# Patient Record
Sex: Male | Born: 1965
Health system: Southern US, Community
[De-identification: ages and names within clinical notes are randomized; demographics above are authoritative.]

## PROBLEM LIST (undated history)

## (undated) DIAGNOSIS — I499 Cardiac arrhythmia, unspecified: Secondary | ICD-10-CM

## (undated) DIAGNOSIS — R011 Cardiac murmur, unspecified: Secondary | ICD-10-CM

## (undated) DIAGNOSIS — Z87442 Personal history of urinary calculi: Secondary | ICD-10-CM

## (undated) DIAGNOSIS — E7800 Pure hypercholesterolemia, unspecified: Secondary | ICD-10-CM

## (undated) DIAGNOSIS — M7551 Bursitis of right shoulder: Secondary | ICD-10-CM

## (undated) DIAGNOSIS — Z95810 Presence of automatic (implantable) cardiac defibrillator: Secondary | ICD-10-CM

## (undated) DIAGNOSIS — I420 Dilated cardiomyopathy: Secondary | ICD-10-CM

## (undated) DIAGNOSIS — I509 Heart failure, unspecified: Secondary | ICD-10-CM

## (undated) DIAGNOSIS — I428 Other cardiomyopathies: Secondary | ICD-10-CM

## (undated) DIAGNOSIS — I2510 Atherosclerotic heart disease of native coronary artery without angina pectoris: Secondary | ICD-10-CM

## (undated) DIAGNOSIS — Z4502 Encounter for adjustment and management of automatic implantable cardiac defibrillator: Secondary | ICD-10-CM

## (undated) DIAGNOSIS — I5042 Chronic combined systolic (congestive) and diastolic (congestive) heart failure: Secondary | ICD-10-CM

---
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
## 1993-01-06 DIAGNOSIS — I499 Cardiac arrhythmia, unspecified: Secondary | ICD-10-CM

## 1993-01-06 HISTORY — DX: Cardiac arrhythmia, unspecified: I49.9

## 2000-08-03 ENCOUNTER — Emergency Department (HOSPITAL_COMMUNITY): Admission: EM | Admit: 2000-08-03 | Discharge: 2000-08-03 | Payer: Self-pay | Admitting: Emergency Medicine

## 2000-08-03 ENCOUNTER — Encounter: Payer: Self-pay | Admitting: Emergency Medicine

## 2004-01-03 ENCOUNTER — Emergency Department (HOSPITAL_COMMUNITY): Admission: EM | Admit: 2004-01-03 | Discharge: 2004-01-03 | Payer: Self-pay | Admitting: Emergency Medicine

## 2013-02-10 ENCOUNTER — Encounter (INDEPENDENT_AMBULATORY_CARE_PROVIDER_SITE_OTHER): Payer: Self-pay | Admitting: Surgery

## 2013-02-10 ENCOUNTER — Telehealth (INDEPENDENT_AMBULATORY_CARE_PROVIDER_SITE_OTHER): Payer: Self-pay

## 2013-02-10 ENCOUNTER — Ambulatory Visit (INDEPENDENT_AMBULATORY_CARE_PROVIDER_SITE_OTHER): Payer: BC Managed Care – PPO | Admitting: Surgery

## 2013-02-10 VITALS — BP 122/84 | HR 64 | Resp 12 | Ht 71.0 in | Wt 174.0 lb

## 2013-02-10 DIAGNOSIS — K4090 Unilateral inguinal hernia, without obstruction or gangrene, not specified as recurrent: Secondary | ICD-10-CM

## 2013-02-10 MED ORDER — HYDROCODONE-ACETAMINOPHEN 7.5-325 MG PO TABS: 1.0000 | ORAL_TABLET | Freq: Four times a day (QID) | ORAL | Status: DC | PRN

## 2013-02-10 NOTE — Progress Notes (Signed)
Dr. Hassell Done could you put orders in [facility] as patient has pre-op appointment on 02/15/2013 at 1 pm! Thank you!

## 2013-02-10 NOTE — Telephone Encounter (Signed)
Message copied by Carlene Coria on Thu Feb 10, 2013 12:36 PM ------      Message from: [place], [employer]: Thu Feb 10, 2013 10:14 AM      Regarding: Can you plese make a follow up appointment       I have schedule him for surgery on 2/11 but could not get a follow up appointment for after the surgery.            Can you please make him an appointment and call him?            thanks ------

## 2013-02-10 NOTE — Telephone Encounter (Signed)
Called pt with po appt.

## 2013-02-10 NOTE — Progress Notes (Signed)
Chief Complaint:  New right inguinal hernia  History of Present Illness:  Miguel Pena is an 48 y.o. male who is referred by Dr. Claris Gower with a right inguinal hernia.  He works for [employer] and does a lot of lifting and pulling. This inguinal hernia has been symptomatic intermittently painful. He denies any symptoms suggesting obstruction.  No past medical history on file.  No past surgical history on file.  Current Outpatient Prescriptions  Medication Sig Dispense Refill  . Fish Oil OIL by Does not apply route.      Marland Kitchen HYDROcodone-acetaminophen (NORCO) 7.5-325 MG per tablet Take 1 tablet by mouth every 6 (six) hours as needed for moderate pain.  30 tablet  0  . nicotine polacrilex (NICORETTE) 2 MG gum Take 2 mg by mouth as needed for smoking cessation.      Marland Kitchen zolpidem (AMBIEN) 10 MG tablet Take 10 mg by mouth at bedtime as needed for sleep.       No current facility-administered medications for this visit.   Review of patient's allergies indicates no known allergies. No family history on file. Social History:   reports that he has quit smoking. He does not have any smokeless tobacco history on file. He reports that he does not drink alcohol or use illicit drugs.   REVIEW OF SYSTEMS - PERTINENT POSITIVES ONLY: Is negative except for symptoms referable to his hernia.  Physical Exam:   Blood pressure 122/84, pulse 64, resp. rate 12, height 5' 11"  (1.803 m), weight 174 lb (78.926 kg). Body mass index is 24.28 kg/(m^2).  Gen:  WDWN white male NAD  Neurological: Alert and oriented to person, place, and time. Motor and sensory function is grossly intact  Head: Normocephalic and atraumatic.  Eyes: Conjunctivae are normal. Pupils are equal, round, and reactive to light. No scleral icterus.  Neck: Normal range of motion. Neck supple. No tracheal deviation or thyromegaly present.  Cardiovascular:  SR without murmurs or gallops.  No carotid bruits Respiratory: Effort normal.  No  respiratory distress. No chest wall tenderness. Breath sounds normal.  No wheezes, rales or rhonchi.  Abdomen:  Soft bulging right inguinal canal easily reducible. There may be a faint bulge in the left inguinal canal but this is very subtle at best. GU: Musculoskeletal: Normal range of motion. Extremities are nontender. No cyanosis, edema or clubbing noted Lymphadenopathy: No cervical, preauricular, postauricular or axillary adenopathy is present Skin: Skin is warm and dry. No rash noted. No diaphoresis. No erythema. No pallor. Pscyh: Normal mood and affect. Behavior is normal. Judgment and thought content normal.   LABORATORY RESULTS: No results found for this or any previous visit (from the past 48 hour(s)).  RADIOLOGY RESULTS: No results found.  Problem List: There are no active problems to display for this patient.   Assessment & Plan: Symptomatic right inguinal hernia. I explained the procedure and my recommendation to do a right inguinal hernia in an open fashion. I discussed laparoscopic fashion. I discussed repair with mesh and indicated that he might have postoperative pain or feel different on the right side. He is aware that there is a chance of recurrence. I gave him a booklet hernia repair for him to review at his leisure.  We'll plan outpatient right inguinal hernia repair under general anesthesia.    [name], MD, [name] [phone] beeper [phone]  02/10/2013 9:34 AM

## 2013-02-10 NOTE — Patient Instructions (Signed)

## 2013-02-11 NOTE — Progress Notes (Signed)
Please put orders in Epic surgery 02-16-13 pre op visit 02-15-13 Thank you!

## 2013-02-14 ENCOUNTER — Encounter (HOSPITAL_COMMUNITY): Payer: Self-pay | Admitting: Pharmacy Technician

## 2013-02-14 NOTE — Progress Notes (Signed)
Please put orders in Epic,surgery 02-16-2013,pre op 02-15-2013 Thank you!

## 2013-02-15 ENCOUNTER — Encounter (HOSPITAL_COMMUNITY): Payer: Self-pay

## 2013-02-15 ENCOUNTER — Encounter (HOSPITAL_COMMUNITY)
Admission: RE | Admit: 2013-02-15 | Discharge: 2013-02-15 | Disposition: A | Discharge status: Home / Self Care | Payer: BC Managed Care – PPO | Source: Ambulatory Visit | Attending: Surgery | Admitting: Surgery

## 2013-02-15 HISTORY — DX: Pure hypercholesterolemia, unspecified: E78.00

## 2013-02-15 HISTORY — DX: Personal history of urinary calculi: Z87.442

## 2013-02-15 HISTORY — DX: Cardiac arrhythmia, unspecified: I49.9

## 2013-02-15 HISTORY — DX: Bursitis of right shoulder: M75.51

## 2013-02-15 LAB — BASIC METABOLIC PANEL
BUN: 7 mg/dL (ref 6–23)
CO2: 28 mEq/L (ref 19–32)
Calcium: 9.5 mg/dL (ref 8.4–10.5)
Chloride: 103 mEq/L (ref 96–112)
Creatinine, Ser: 0.75 mg/dL (ref 0.50–1.35)
Glucose, Bld: 84 mg/dL (ref 70–99)
POTASSIUM: 4 meq/L (ref 3.7–5.3)
Sodium: 141 mEq/L (ref 137–147)

## 2013-02-15 LAB — CBC
HCT: 42.6 % (ref 39.0–52.0)
Hemoglobin: 14.9 g/dL (ref 13.0–17.0)
MCH: 30.4 pg (ref 26.0–34.0)
MCHC: 35 g/dL (ref 30.0–36.0)
MCV: 86.9 fL (ref 78.0–100.0)
PLATELETS: 200 10*3/uL (ref 150–400)
RBC: 4.9 MIL/uL (ref 4.22–5.81)
RDW: 12.7 % (ref 11.5–15.5)
WBC: 6.1 10*3/uL (ref 4.0–10.5)

## 2013-02-15 NOTE — Progress Notes (Signed)
Spoke with Dr Christian Mate, anesthesia, history and ekg reviewed- stated would be evaluated by anesthesia doctor tomorrow.  Faxed EKG TO Dr Arelia Sneddon, PCP with confirmation

## 2013-02-15 NOTE — Patient Instructions (Signed)
      Your procedure is scheduled on:  02/17/12  [facility]  Report to [place] AM.  Call this number if you have problems the morning of surgery: [phone]      Do not eat food  Or drink :After Midnight.  Tuesday  Night   Take these medicines the morning of surgery with A SIP OF WATER:NO regular meds-       May take HYDROCODONE IF NEEDED   .  Contacts, dentures or partial plates, or metal hairpins  can not be worn to surgery. Your family will be responsible for glasses, dentures, hearing aides while you are in surgery  Leave suitcase in the car. After surgery it may be brought to your room.  For patients admitted to the hospital, checkout time is 11:00 AM day of  discharge.                DO NOT WEAR JEWELRY, LOTIONS, POWDERS, OR PERFUMES.  WOMEN-- DO NOT SHAVE LEGS OR UNDERARMS FOR 48 HOURS BEFORE SHOWERS. MEN MAY SHAVE FACE.  Patients discharged the day of surgery will not be allowed to drive home. IF going home the day of surgery, you must have a driver and someone to stay with you for the first 24 hours  Name and phone number of your driver:    Wife  Margart Sickles  PST [phone]  [phone]                 FAILURE TO FOLLOW THESE INSTRUCTIONS MAY RESULT IN  CANCELLATION   OF YOUR SURGERY                                                  Patient Signature _____________________________

## 2013-02-15 NOTE — Progress Notes (Signed)
Attempted to obtain old EKG from Dr Arelia Sneddon office- was told began care there 12/13 and there is not a EKG at there office

## 2013-02-16 ENCOUNTER — Ambulatory Visit (HOSPITAL_COMMUNITY)
Admission: RE | Admit: 2013-02-16 | Discharge: 2013-02-16 | Disposition: A | Discharge status: Home / Self Care | Payer: BC Managed Care – PPO | Source: Ambulatory Visit | Attending: Surgery | Admitting: Surgery

## 2013-02-16 ENCOUNTER — Encounter (HOSPITAL_COMMUNITY): Payer: Self-pay | Admitting: *Deleted

## 2013-02-16 ENCOUNTER — Encounter (HOSPITAL_COMMUNITY)
Admission: RE | Disposition: A | Discharge status: Home / Self Care | Payer: Self-pay | Source: Ambulatory Visit | Attending: Surgery

## 2013-02-16 ENCOUNTER — Ambulatory Visit (HOSPITAL_COMMUNITY): Payer: BC Managed Care – PPO | Admitting: Certified Registered"

## 2013-02-16 ENCOUNTER — Encounter (HOSPITAL_COMMUNITY): Payer: BC Managed Care – PPO | Admitting: Certified Registered"

## 2013-02-16 ENCOUNTER — Other Ambulatory Visit (INDEPENDENT_AMBULATORY_CARE_PROVIDER_SITE_OTHER): Payer: Self-pay | Admitting: Surgery

## 2013-02-16 DIAGNOSIS — Z87891 Personal history of nicotine dependence: Secondary | ICD-10-CM | POA: Insufficient documentation

## 2013-02-16 DIAGNOSIS — K4090 Unilateral inguinal hernia, without obstruction or gangrene, not specified as recurrent: Secondary | ICD-10-CM

## 2013-02-16 DIAGNOSIS — Z8719 Personal history of other diseases of the digestive system: Secondary | ICD-10-CM

## 2013-02-16 DIAGNOSIS — Z9889 Other specified postprocedural states: Secondary | ICD-10-CM

## 2013-02-16 HISTORY — PX: INGUINAL HERNIA REPAIR: SHX194

## 2013-02-16 HISTORY — PX: INGUINAL HERNIA REPAIR: SUR1180

## 2013-02-16 SURGERY — REPAIR, HERNIA, INGUINAL, ADULT
Anesthesia: General | Site: Groin | Laterality: Right

## 2013-02-16 MED ORDER — PROPOFOL 10 MG/ML IV BOLUS
INTRAVENOUS | Status: DC | PRN
  Administered 2013-02-16: 200 mg via INTRAVENOUS

## 2013-02-16 MED ORDER — HEPARIN SODIUM (PORCINE) 5000 UNIT/ML IJ SOLN
5000.0000 [IU] | Freq: Once | INTRAMUSCULAR | Status: AC
  Administered 2013-02-16: 5000 [IU] via SUBCUTANEOUS
  Filled 2013-02-16: qty 1

## 2013-02-16 MED ORDER — CEFAZOLIN SODIUM-DEXTROSE 2-3 GM-% IV SOLR
2.0000 g | INTRAVENOUS | Status: AC
  Administered 2013-02-16: 2 g via INTRAVENOUS

## 2013-02-16 MED ORDER — PROPOFOL 10 MG/ML IV BOLUS
INTRAVENOUS | Status: AC
  Filled 2013-02-16: qty 20

## 2013-02-16 MED ORDER — MORPHINE SULFATE 10 MG/ML IJ SOLN: 1.0000 mg | INTRAMUSCULAR | Status: DC | PRN

## 2013-02-16 MED ORDER — FENTANYL CITRATE 0.05 MG/ML IJ SOLN
INTRAMUSCULAR | Status: AC
  Filled 2013-02-16: qty 2

## 2013-02-16 MED ORDER — ONDANSETRON HCL 4 MG/2ML IJ SOLN
INTRAMUSCULAR | Status: AC
  Filled 2013-02-16: qty 2

## 2013-02-16 MED ORDER — SODIUM CHLORIDE 0.9 % IJ SOLN: 3.0000 mL | Freq: Two times a day (BID) | INTRAMUSCULAR | Status: DC

## 2013-02-16 MED ORDER — BUPIVACAINE LIPOSOME 1.3 % IJ SUSP
20.0000 mL | Freq: Once | INTRAMUSCULAR | Status: DC
  Filled 2013-02-16 (×2): qty 20

## 2013-02-16 MED ORDER — KETOROLAC TROMETHAMINE 15 MG/ML IJ SOLN
INTRAMUSCULAR | Status: AC
  Filled 2013-02-16: qty 1

## 2013-02-16 MED ORDER — ONDANSETRON HCL 4 MG/2ML IJ SOLN: 4.0000 mg | Freq: Four times a day (QID) | INTRAMUSCULAR | Status: DC | PRN

## 2013-02-16 MED ORDER — CHLORHEXIDINE GLUCONATE 4 % EX LIQD: 1.0000 "application " | Freq: Once | CUTANEOUS | Status: DC

## 2013-02-16 MED ORDER — ONDANSETRON HCL 4 MG/2ML IJ SOLN
INTRAMUSCULAR | Status: DC | PRN
  Administered 2013-02-16: 4 mg via INTRAVENOUS

## 2013-02-16 MED ORDER — LACTATED RINGERS IV SOLN: INTRAVENOUS | Status: DC

## 2013-02-16 MED ORDER — OXYCODONE HCL 5 MG PO TABS: 5.0000 mg | ORAL_TABLET | ORAL | Status: DC | PRN

## 2013-02-16 MED ORDER — LIDOCAINE HCL (CARDIAC) 20 MG/ML IV SOLN
INTRAVENOUS | Status: DC | PRN
  Administered 2013-02-16: 80 mg via INTRAVENOUS

## 2013-02-16 MED ORDER — ACETAMINOPHEN 650 MG RE SUPP
650.0000 mg | RECTAL | Status: DC | PRN
  Filled 2013-02-16: qty 1

## 2013-02-16 MED ORDER — CEFAZOLIN SODIUM-DEXTROSE 2-3 GM-% IV SOLR
INTRAVENOUS | Status: AC
  Filled 2013-02-16: qty 50

## 2013-02-16 MED ORDER — ACETAMINOPHEN 325 MG PO TABS: 650.0000 mg | ORAL_TABLET | ORAL | Status: DC | PRN

## 2013-02-16 MED ORDER — KETOROLAC TROMETHAMINE 15 MG/ML IJ SOLN
15.0000 mg | Freq: Four times a day (QID) | INTRAMUSCULAR | Status: DC
Start: 2013-02-16 — End: 2013-02-16
  Administered 2013-02-16: 15 mg via INTRAVENOUS

## 2013-02-16 MED ORDER — OXYCODONE-ACETAMINOPHEN 5-325 MG PO TABS: 1.0000 | ORAL_TABLET | ORAL | Status: DC | PRN

## 2013-02-16 MED ORDER — 0.9 % SODIUM CHLORIDE (POUR BTL) OPTIME
TOPICAL | Status: DC | PRN
  Administered 2013-02-16: 1000 mL

## 2013-02-16 MED ORDER — MIDAZOLAM HCL 2 MG/2ML IJ SOLN
INTRAMUSCULAR | Status: AC
  Filled 2013-02-16: qty 2

## 2013-02-16 MED ORDER — FENTANYL CITRATE 0.05 MG/ML IJ SOLN
INTRAMUSCULAR | Status: DC | PRN
Start: 2013-02-16 — End: 2013-02-16
  Administered 2013-02-16 (×4): 50 ug via INTRAVENOUS

## 2013-02-16 MED ORDER — LACTATED RINGERS IV SOLN
INTRAVENOUS | Status: DC | PRN
  Administered 2013-02-16 (×2): via INTRAVENOUS

## 2013-02-16 MED ORDER — FENTANYL CITRATE 0.05 MG/ML IJ SOLN
25.0000 ug | INTRAMUSCULAR | Status: DC | PRN
  Administered 2013-02-16 (×3): 50 ug via INTRAVENOUS

## 2013-02-16 MED ORDER — BUPIVACAINE LIPOSOME 1.3 % IJ SUSP
INTRAMUSCULAR | Status: DC | PRN
  Administered 2013-02-16: 20 mL

## 2013-02-16 MED ORDER — MIDAZOLAM HCL 5 MG/5ML IJ SOLN
INTRAMUSCULAR | Status: DC | PRN
  Administered 2013-02-16: 2 mg via INTRAVENOUS

## 2013-02-16 MED ORDER — LIDOCAINE HCL (CARDIAC) 20 MG/ML IV SOLN
INTRAVENOUS | Status: AC
  Filled 2013-02-16: qty 5

## 2013-02-16 MED ORDER — SODIUM CHLORIDE 0.9 % IV SOLN: 250.0000 mL | INTRAVENOUS | Status: DC | PRN

## 2013-02-16 MED ORDER — SODIUM CHLORIDE 0.9 % IJ SOLN: 3.0000 mL | INTRAMUSCULAR | Status: DC | PRN

## 2013-02-16 SURGICAL SUPPLY — 41 items
ADH SKN CLS APL DERMABOND .7 (GAUZE/BANDAGES/DRESSINGS) ×1
APL SKNCLS STERI-STRIP NONHPOA (GAUZE/BANDAGES/DRESSINGS)
BENZOIN TINCTURE PRP APPL 2/3 (GAUZE/BANDAGES/DRESSINGS) IMPLANT
BLADE HEX COATED 2.75 (ELECTRODE) ×3 IMPLANT
BLADE SURG 15 STRL LF DISP TIS (BLADE) ×1 IMPLANT
BLADE SURG 15 STRL SS (BLADE) ×3
BLADE SURG SZ10 CARB STEEL (BLADE) ×3 IMPLANT
CANISTER SUCTION 2500CC (MISCELLANEOUS) ×3 IMPLANT
CLOSURE WOUND 1/2 X4 (GAUZE/BANDAGES/DRESSINGS)
DECANTER SPIKE VIAL GLASS SM (MISCELLANEOUS) ×3 IMPLANT
DERMABOND ADVANCED (GAUZE/BANDAGES/DRESSINGS) ×2
DERMABOND ADVANCED .7 DNX12 (GAUZE/BANDAGES/DRESSINGS) IMPLANT
DISSECTOR ROUND CHERRY 3/8 STR (MISCELLANEOUS) IMPLANT
DRAIN PENROSE 18X1/2 LTX STRL (DRAIN) ×3 IMPLANT
DRAPE LAPAROTOMY TRNSV 102X78 (DRAPE) ×3 IMPLANT
ELECT REM PT RETURN 9FT ADLT (ELECTROSURGICAL) ×3
ELECTRODE REM PT RTRN 9FT ADLT (ELECTROSURGICAL) ×1 IMPLANT
GLOVE BIOGEL M 8.0 STRL (GLOVE) ×3 IMPLANT
GOWN STRL REUS W/TWL XL LVL3 (GOWN DISPOSABLE) ×6 IMPLANT
KIT BASIN OR (CUSTOM PROCEDURE TRAY) ×3 IMPLANT
MESH HERNIA 3X6 (Mesh General) ×2 IMPLANT
NDL HYPO 25X1 1.5 SAFETY (NEEDLE) ×1 IMPLANT
NEEDLE HYPO 25X1 1.5 SAFETY (NEEDLE) ×3 IMPLANT
NS IRRIG 1000ML POUR BTL (IV SOLUTION) ×3 IMPLANT
PACK BASIC VI WITH GOWN DISP (CUSTOM PROCEDURE TRAY) ×3 IMPLANT
PENCIL BUTTON HOLSTER BLD 10FT (ELECTRODE) ×3 IMPLANT
SPONGE GAUZE 4X4 12PLY (GAUZE/BANDAGES/DRESSINGS) IMPLANT
SPONGE LAP 4X18 X RAY DECT (DISPOSABLE) ×3 IMPLANT
STAPLER VISISTAT 35W (STAPLE) IMPLANT
STRIP CLOSURE SKIN 1/2X4 (GAUZE/BANDAGES/DRESSINGS) IMPLANT
SUT PROLENE 2 0 CT2 30 (SUTURE) ×6 IMPLANT
SUT SILK 2 0 SH (SUTURE) ×4 IMPLANT
SUT SILK 2 0 SH CR/8 (SUTURE) IMPLANT
SUT VIC AB 2-0 SH 27 (SUTURE) ×3
SUT VIC AB 2-0 SH 27X BRD (SUTURE) ×1 IMPLANT
SUT VIC AB 4-0 SH 18 (SUTURE) ×7 IMPLANT
SYR 20CC LL (SYRINGE) ×3 IMPLANT
SYR BULB IRRIGATION 50ML (SYRINGE) ×3 IMPLANT
TOWEL OR 17X26 10 PK STRL BLUE (TOWEL DISPOSABLE) ×3 IMPLANT
TOWEL OR NON WOVEN STRL DISP B (DISPOSABLE) ×3 IMPLANT
YANKAUER SUCT BULB TIP 10FT TU (MISCELLANEOUS) ×3 IMPLANT

## 2013-02-16 NOTE — Interval H&P Note (Signed)
History and Physical Interval Note:  02/16/2013 8:34 AM  Miguel Pena  has presented today for surgery, with the diagnosis of right inguinal hernia  The various methods of treatment have been discussed with the patient and family. After consideration of risks, benefits and other options for treatment, the patient has consented to  Procedure(s): RIGHT INGUINAL HERNIA REPAIR  (Right) as a surgical intervention .  The patient's history has been reviewed, patient examined, no change in status, stable for surgery.  I have reviewed the patient's chart and labs.  Questions were answered to the patient's satisfaction.     Noraa Pickeral B

## 2013-02-16 NOTE — Anesthesia Preprocedure Evaluation (Addendum)
Anesthesia Evaluation  Patient identified by MRN, date of birth, ID band Patient awake    Reviewed: Allergy & Precautions, H&P , NPO status , Patient's Chart, lab work & pertinent test results  Airway Mallampati: II TM Distance: >3 FB Neck ROM: full    Dental  (+) Dental Advisory Given, Chipped, Caps Large chip left upper front.  Cap right upper front:   Pulmonary neg pulmonary ROS, former smoker,  breath sounds clear to auscultation  Pulmonary exam normal       Cardiovascular Exercise Tolerance: Good + dysrhythmias Ventricular Tachycardia Rhythm:regular Rate:Normal  LBBB.  VT 20 years ago but not again.  No previous ECG to see if LBBB is recent.   Neuro/Psych negative neurological ROS  negative psych ROS   GI/Hepatic negative GI ROS, Neg liver ROS,   Endo/Other  negative endocrine ROS  Renal/GU negative Renal ROS  negative genitourinary   Musculoskeletal   Abdominal   Peds  Hematology negative hematology ROS (+)   Anesthesia Other Findings   Reproductive/Obstetrics negative OB ROS                          Anesthesia Physical Anesthesia Plan  ASA: III  Anesthesia Plan: General   Post-op Pain Management:    Induction: Intravenous  Airway Management Planned: LMA  Additional Equipment:   Intra-op Plan:   Post-operative Plan:   Informed Consent: I have reviewed the patients History and Physical, chart, labs and discussed the procedure including the risks, benefits and alternatives for the proposed anesthesia with the patient or authorized representative who has indicated his/her understanding and acceptance.   Dental Advisory Given  Plan Discussed with: CRNA and Surgeon  Anesthesia Plan Comments:         Anesthesia Quick Evaluation

## 2013-02-16 NOTE — Transfer of Care (Signed)
Immediate Anesthesia Transfer of Care Note  Patient: Miguel Pena  Procedure(s) Performed: Procedure(s): RIGHT INGUINAL HERNIA REPAIR  (Right)  Patient Location: PACU  Anesthesia Type:General  Level of Consciousness: awake, alert  and oriented  Airway & Oxygen Therapy: Patient Spontanous Breathing and Patient connected to face mask oxygen  Post-op Assessment: Report given to PACU RN and Post -op Vital signs reviewed and stable  Post vital signs: Reviewed and stable  Complications: No apparent anesthesia complications

## 2013-02-16 NOTE — Anesthesia Postprocedure Evaluation (Signed)
  Anesthesia Post-op Note  Patient: Miguel Pena  Procedure(s) Performed: Procedure(s) (LRB): RIGHT INGUINAL HERNIA REPAIR  (Right)  Patient Location: PACU  Anesthesia Type: General  Level of Consciousness: awake and alert   Airway and Oxygen Therapy: Patient Spontanous Breathing  Post-op Pain: mild  Post-op Assessment: Post-op Vital signs reviewed, Patient's Cardiovascular Status Stable, Respiratory Function Stable, Patent Airway and No signs of Nausea or vomiting  Last Vitals:  Filed Vitals:   02/16/13 1107  BP: 129/75  Pulse: 89  Temp: 36.7 C  Resp: 14    Post-op Vital Signs: stable   Complications: No apparent anesthesia complications

## 2013-02-16 NOTE — Discharge Instructions (Signed)
Inguinal Hernia, Adult  Care After Refer to this sheet in the next few weeks. These discharge instructions provide you with general information on caring for yourself after you leave the hospital. Your caregiver may also give you specific instructions. Your treatment has been planned according to the most current medical practices available, but unavoidable complications sometimes occur. If you have any problems or questions after discharge, please call your caregiver. HOME CARE INSTRUCTIONS  Put ice on the operative site.  Put ice in a plastic bag.  Place a towel between your skin and the bag.  Leave the ice on for 15-20 minutes at a time, 03-04 times a day while awake.  Change bandages (dressings) as directed.  Keep the wound dry and clean. The wound may be washed gently with soap and water. Gently blot or dab the wound dry. It is okay to take showers 24 to 48 hours after surgery. Do not take baths, use swimming pools, or use hot tubs for 10 days, or as directed by your caregiver.  Only take over-the-counter or prescription medicines for pain, discomfort, or fever as directed by your caregiver.  Continue your normal diet as directed.  Do not lift anything more than 10 pounds or play contact sports for 3 weeks, or as directed. SEEK MEDICAL CARE IF:  There is redness, swelling, or increasing pain in the wound.  There is fluid (pus) coming from the wound.  There is drainage from a wound lasting longer than 1 day.  You have an oral temperature above 102 F (38.9 C).  You notice a bad smell coming from the wound or dressing.  The wound breaks open after the stitches (sutures) have been removed.  You notice increasing pain in the shoulders (shoulder strap areas).  You develop dizzy episodes or fainting while standing.  You feel sick to your stomach (nauseous) or throw up (vomit). SEEK IMMEDIATE MEDICAL CARE IF:  You develop a rash.  You have difficulty breathing.  You  develop a reaction or have side effects to medicines you were given. MAKE SURE YOU:   Understand these instructions.  Will watch your condition.  Will get help right away if you are not doing well or get worse. Document Released: 01/23/2006 Document Revised: 03/17/2011 Document Reviewed: 11/22/2008 [facility] Patient Information 2014 [place], [place].

## 2013-02-16 NOTE — Op Note (Signed)
Surgeon: Kaylyn Lim, MD, FACS  Asst:  none  Anes:  General by LMA  Procedure: Open right inguinal hernia repair  Diagnosis: Right indirect inguinal hernia  Complications: none  EBL:   minimal cc  Description of Procedure:  The patient was taken to room 1 and given general anesthesia. The right side had been previously marked by him and by me. After prepping with PCMX and draping a timeout was performed. I made a small oblique incision in his right inguinal region and carried this down through Scarpa's fascia to expose the external oblique and the external ring. There I noticed splaying of the fibers of the external oblique along the canal which often is seen with a sports hernia. This could be the source of the pain that he was having with this hernia. I incised in this weakness and open the external oblique and mobilize the cord. I went proximally and dissected out an indirect sac which I opened and from a finger inside. I then twisted the sac and performed a high ligation with a 2-0 silk. There were also 2 large lipomas of the cord that I excised and similarly ligated with 2-0 silk.  I then completed a repair of the floor with a piece of Marlex mesh cut to fit sutured along the inguinal ligament and suturing it medially with a running 2-0 Prolene. I cut it to fit around the cord structures and I attached it to itself with a single horizontal mattress suture of 2-0 Prolene laterally and tucked beneath the external oblique.  The ilioinguinal nerve branch which was prominent had been separated from the cord and retracted separately with the medial flap of the external oblique was allowed to return into the inguinal canal and then the external oblique was closed with running 2-0 Prolene. I then injected the area with Exparel 30 cc and closed in layers with 4-0 Vicryl through Scarpa's and then subcutaneously and subcuticularly and then with Dermabond.  [name], [place], [facility], [place]

## 2013-02-16 NOTE — H&P (View-Only) (Signed)
Chief Complaint:  New right inguinal hernia  History of Present Illness:  Miguel Pena is an 48 y.o. male who is referred by Dr. Claris Gower with a right inguinal hernia.  He works for [employer] and does a lot of lifting and pulling. This inguinal hernia has been symptomatic intermittently painful. He denies any symptoms suggesting obstruction.  No past medical history on file.  No past surgical history on file.  Current Outpatient Prescriptions  Medication Sig Dispense Refill  . Fish Oil OIL by Does not apply route.      Marland Kitchen HYDROcodone-acetaminophen (NORCO) 7.5-325 MG per tablet Take 1 tablet by mouth every 6 (six) hours as needed for moderate pain.  30 tablet  0  . nicotine polacrilex (NICORETTE) 2 MG gum Take 2 mg by mouth as needed for smoking cessation.      Marland Kitchen zolpidem (AMBIEN) 10 MG tablet Take 10 mg by mouth at bedtime as needed for sleep.       No current facility-administered medications for this visit.   Review of patient's allergies indicates no known allergies. No family history on file. Social History:   reports that he has quit smoking. He does not have any smokeless tobacco history on file. He reports that he does not drink alcohol or use illicit drugs.   REVIEW OF SYSTEMS - PERTINENT POSITIVES ONLY: Is negative except for symptoms referable to his hernia.  Physical Exam:   Blood pressure 122/84, pulse 64, resp. rate 12, height 5' 11"  (1.803 m), weight 174 lb (78.926 kg). Body mass index is 24.28 kg/(m^2).  Gen:  WDWN white male NAD  Neurological: Alert and oriented to person, place, and time. Motor and sensory function is grossly intact  Head: Normocephalic and atraumatic.  Eyes: Conjunctivae are normal. Pupils are equal, round, and reactive to light. No scleral icterus.  Neck: Normal range of motion. Neck supple. No tracheal deviation or thyromegaly present.  Cardiovascular:  SR without murmurs or gallops.  No carotid bruits Respiratory: Effort normal.  No  respiratory distress. No chest wall tenderness. Breath sounds normal.  No wheezes, rales or rhonchi.  Abdomen:  Soft bulging right inguinal canal easily reducible. There may be a faint bulge in the left inguinal canal but this is very subtle at best. GU: Musculoskeletal: Normal range of motion. Extremities are nontender. No cyanosis, edema or clubbing noted Lymphadenopathy: No cervical, preauricular, postauricular or axillary adenopathy is present Skin: Skin is warm and dry. No rash noted. No diaphoresis. No erythema. No pallor. Pscyh: Normal mood and affect. Behavior is normal. Judgment and thought content normal.   LABORATORY RESULTS: No results found for this or any previous visit (from the past 48 hour(s)).  RADIOLOGY RESULTS: No results found.  Problem List: There are no active problems to display for this patient.   Assessment & Plan: Symptomatic right inguinal hernia. I explained the procedure and my recommendation to do a right inguinal hernia in an open fashion. I discussed laparoscopic fashion. I discussed repair with mesh and indicated that he might have postoperative pain or feel different on the right side. He is aware that there is a chance of recurrence. I gave him a booklet hernia repair for him to review at his leisure.  We'll plan outpatient right inguinal hernia repair under general anesthesia.    [name], MD, [name] [phone] beeper [phone]  02/10/2013 9:34 AM

## 2013-02-17 ENCOUNTER — Encounter (HOSPITAL_COMMUNITY): Payer: Self-pay | Admitting: Surgery

## 2013-03-10 ENCOUNTER — Ambulatory Visit (INDEPENDENT_AMBULATORY_CARE_PROVIDER_SITE_OTHER): Payer: BC Managed Care – PPO | Admitting: Surgery

## 2013-03-10 ENCOUNTER — Encounter (INDEPENDENT_AMBULATORY_CARE_PROVIDER_SITE_OTHER): Payer: Self-pay

## 2013-03-10 ENCOUNTER — Encounter (INDEPENDENT_AMBULATORY_CARE_PROVIDER_SITE_OTHER): Payer: Self-pay | Admitting: Surgery

## 2013-03-10 VITALS — BP 144/88 | HR 66 | Temp 98.6°F | Resp 18 | Ht 71.0 in | Wt 173.4 lb

## 2013-03-10 DIAGNOSIS — Z9889 Other specified postprocedural states: Secondary | ICD-10-CM

## 2013-03-10 DIAGNOSIS — Z8719 Personal history of other diseases of the digestive system: Secondary | ICD-10-CM

## 2013-03-10 NOTE — Patient Instructions (Signed)
May return to work in 2 weeks

## 2013-03-10 NOTE — Progress Notes (Signed)
Miguel Pena 48 y.o.  Body mass index is 24.2 kg/(m^2).  Patient Active Problem List   Diagnosis Date Noted  . S/P right inguinal hernia repair 02/16/2013    No Known Allergies  Past Surgical History  Procedure Laterality Date  . No past surgeries    . Inguinal hernia repair Right 02/16/2013    Procedure: RIGHT INGUINAL HERNIA REPAIR ;  Surgeon: [name], MD;  Location: [facility];  Service: General;  Laterality: Right;  With MESH  . Hernia repair Right 02/16/2013    RIH repair   [name], MD 1. S/P right inguinal hernia repair     Incision is healing very well.  No problems.  Sore as expected.  Will return to work in 2 weeks.   Return 2 months.  [name], MD, [name] [phone] beeper [phone]  03/10/2013 12:59 PM

## 2013-03-25 ENCOUNTER — Encounter (INDEPENDENT_AMBULATORY_CARE_PROVIDER_SITE_OTHER): Payer: Self-pay

## 2013-05-26 ENCOUNTER — Ambulatory Visit (INDEPENDENT_AMBULATORY_CARE_PROVIDER_SITE_OTHER): Payer: BC Managed Care – PPO | Admitting: Surgery

## 2013-05-26 ENCOUNTER — Encounter (INDEPENDENT_AMBULATORY_CARE_PROVIDER_SITE_OTHER): Payer: Self-pay | Admitting: Surgery

## 2013-05-26 VITALS — BP 122/80 | HR 78 | Temp 97.5°F | Resp 16 | Ht 71.0 in | Wt 172.2 lb

## 2013-05-26 DIAGNOSIS — Z9889 Other specified postprocedural states: Secondary | ICD-10-CM

## 2013-05-26 DIAGNOSIS — Z8719 Personal history of other diseases of the digestive system: Secondary | ICD-10-CM

## 2013-05-26 NOTE — Patient Instructions (Signed)
May resume unrestricted activity

## 2013-05-26 NOTE — Progress Notes (Signed)
Miguel Pena 48 y.o.  Body mass index is 24.03 kg/(m^2).  Patient Active Problem List   Diagnosis Date Noted  . S/P right inguinal hernia repair 02/16/2013    No Known Allergies  Past Surgical History  Procedure Laterality Date  . No past surgeries    . Inguinal hernia repair Right 02/16/2013    Procedure: RIGHT INGUINAL HERNIA REPAIR ;  Surgeon: [name], MD;  Location: [facility];  Service: General;  Laterality: Right;  With MESH  . Hernia repair Right 02/16/2013    RIH repair   [name], MD No diagnosis found.  Still having occasional discomfort in right inguinal region.  PE shows intact repair.  No trigger points.  Testes are symmetric.  He reports that his sex drive is down.   Repair intact.  Encouraged to engage work full speed.  Will recheck in 4 months.  [name], MD, [name] [phone] beeper [phone]  05/26/2013 11:46 AM

## 2013-10-07 ENCOUNTER — Ambulatory Visit (INDEPENDENT_AMBULATORY_CARE_PROVIDER_SITE_OTHER): Payer: BC Managed Care – PPO | Admitting: Surgery

## 2013-10-14 ENCOUNTER — Ambulatory Visit (INDEPENDENT_AMBULATORY_CARE_PROVIDER_SITE_OTHER): Payer: BC Managed Care – PPO | Admitting: Surgery

## 2014-05-16 ENCOUNTER — Other Ambulatory Visit: Payer: Self-pay | Admitting: Family Medicine

## 2014-05-16 DIAGNOSIS — E291 Testicular hypofunction: Secondary | ICD-10-CM

## 2014-06-06 ENCOUNTER — Other Ambulatory Visit: Payer: Self-pay | Admitting: Family Medicine

## 2014-06-06 DIAGNOSIS — E291 Testicular hypofunction: Secondary | ICD-10-CM

## 2014-06-20 ENCOUNTER — Ambulatory Visit
Admission: RE | Admit: 2014-06-20 | Discharge: 2014-06-20 | Disposition: A | Discharge status: Home / Self Care | Payer: BLUE CROSS/BLUE SHIELD | Source: Ambulatory Visit | Attending: Family Medicine | Admitting: Family Medicine

## 2014-06-20 DIAGNOSIS — E291 Testicular hypofunction: Secondary | ICD-10-CM

## 2014-06-20 MED ORDER — GADOBENATE DIMEGLUMINE 529 MG/ML IV SOLN
7.0000 mL | Freq: Once | INTRAVENOUS | Status: AC | PRN
  Administered 2014-06-20: 7 mL via INTRAVENOUS

## 2016-09-22 ENCOUNTER — Encounter (HOSPITAL_COMMUNITY): Payer: Self-pay | Admitting: Emergency Medicine

## 2016-09-22 ENCOUNTER — Emergency Department (HOSPITAL_COMMUNITY): Payer: 59

## 2016-09-22 ENCOUNTER — Encounter: Payer: Self-pay | Admitting: Physician Assistant

## 2016-09-22 ENCOUNTER — Ambulatory Visit: Payer: 59

## 2016-09-22 ENCOUNTER — Emergency Department (HOSPITAL_COMMUNITY)
Admission: EM | Admit: 2016-09-22 | Discharge: 2016-09-22 | Disposition: A | Discharge status: Home / Self Care | Payer: 59 | Attending: Emergency Medicine | Admitting: Emergency Medicine

## 2016-09-22 ENCOUNTER — Ambulatory Visit (INDEPENDENT_AMBULATORY_CARE_PROVIDER_SITE_OTHER): Payer: 59 | Admitting: Physician Assistant

## 2016-09-22 VITALS — BP 119/83 | HR 105 | Temp 97.5°F | Resp 18 | Ht 70.67 in | Wt 193.6 lb

## 2016-09-22 DIAGNOSIS — Z87891 Personal history of nicotine dependence: Secondary | ICD-10-CM | POA: Insufficient documentation

## 2016-09-22 DIAGNOSIS — R0602 Shortness of breath: Secondary | ICD-10-CM | POA: Diagnosis not present

## 2016-09-22 DIAGNOSIS — Z79899 Other long term (current) drug therapy: Secondary | ICD-10-CM | POA: Insufficient documentation

## 2016-09-22 DIAGNOSIS — I509 Heart failure, unspecified: Secondary | ICD-10-CM

## 2016-09-22 LAB — CBC WITH DIFFERENTIAL/PLATELET
BASOS PCT: 0 %
Basophils Absolute: 0 10*3/uL (ref 0.0–0.1)
EOS ABS: 0.1 10*3/uL (ref 0.0–0.7)
Eosinophils Relative: 1 %
HCT: 40.8 % (ref 39.0–52.0)
HEMOGLOBIN: 14.1 g/dL (ref 13.0–17.0)
LYMPHS ABS: 0.9 10*3/uL (ref 0.7–4.0)
Lymphocytes Relative: 13 %
MCH: 29.6 pg (ref 26.0–34.0)
MCHC: 34.6 g/dL (ref 30.0–36.0)
MCV: 85.5 fL (ref 78.0–100.0)
Monocytes Absolute: 0.5 10*3/uL (ref 0.1–1.0)
Monocytes Relative: 7 %
Neutro Abs: 5.8 10*3/uL (ref 1.7–7.7)
Neutrophils Relative %: 79 %
Platelets: 172 10*3/uL (ref 150–400)
RBC: 4.77 MIL/uL (ref 4.22–5.81)
RDW: 14.3 % (ref 11.5–15.5)
WBC: 7.4 10*3/uL (ref 4.0–10.5)

## 2016-09-22 LAB — CBC
Hematocrit: 42.3 % (ref 37.5–51.0)
Hemoglobin: 15.2 g/dL (ref 13.0–17.7)
MCH: 30 pg (ref 26.6–33.0)
MCHC: 35.9 g/dL — ABNORMAL HIGH (ref 31.5–35.7)
MCV: 83 fL (ref 79–97)
PLATELETS: 189 10*3/uL (ref 150–379)
RBC: 5.07 x10E6/uL (ref 4.14–5.80)
RDW: 14.7 % (ref 12.3–15.4)
WBC: 7.7 10*3/uL (ref 3.4–10.8)

## 2016-09-22 LAB — BASIC METABOLIC PANEL
ANION GAP: 6 (ref 5–15)
BUN: 8 mg/dL (ref 6–20)
CO2: 21 mmol/L — ABNORMAL LOW (ref 22–32)
Calcium: 8.7 mg/dL — ABNORMAL LOW (ref 8.9–10.3)
Chloride: 110 mmol/L (ref 101–111)
Creatinine, Ser: 1.08 mg/dL (ref 0.61–1.24)
GFR calc Af Amer: >60 mL/min (ref >60)
Glucose, Bld: 102 mg/dL — ABNORMAL HIGH (ref 65–99)
POTASSIUM: 4 mmol/L (ref 3.5–5.1)
Sodium: 137 mmol/L (ref 135–145)

## 2016-09-22 LAB — BRAIN NATRIURETIC PEPTIDE: B Natriuretic Peptide: 750.2 pg/mL — ABNORMAL HIGH (ref 0.0–100.0)

## 2016-09-22 LAB — I-STAT TROPONIN, ED: TROPONIN I, POC: 0.01 ng/mL (ref 0.00–0.08)

## 2016-09-22 MED ORDER — METOPROLOL TARTRATE 25 MG PO TABS
50.0000 mg | ORAL_TABLET | Freq: Two times a day (BID) | ORAL | 0 refills | Status: DC
Start: 2016-09-22 — End: 2016-09-30

## 2016-09-22 MED ORDER — FUROSEMIDE 40 MG PO TABS
40.0000 mg | ORAL_TABLET | Freq: Every day | ORAL | 0 refills | Status: DC
Start: 2016-09-22 — End: 2016-10-28

## 2016-09-22 MED ORDER — ASPIRIN 81 MG PO CHEW
324.0000 mg | CHEWABLE_TABLET | Freq: Once | ORAL | Status: AC
  Administered 2016-09-22: 324 mg via ORAL

## 2016-09-22 MED ORDER — ALBUTEROL SULFATE (2.5 MG/3ML) 0.083% IN NEBU
5.0000 mg | INHALATION_SOLUTION | Freq: Once | RESPIRATORY_TRACT | Status: AC
  Administered 2016-09-22: 5 mg via RESPIRATORY_TRACT
  Filled 2016-09-22: qty 6

## 2016-09-22 MED ORDER — POTASSIUM CHLORIDE CRYS ER 20 MEQ PO TBCR: 20.0000 meq | EXTENDED_RELEASE_TABLET | Freq: Two times a day (BID) | ORAL | 0 refills | Status: DC

## 2016-09-22 MED ORDER — SODIUM CHLORIDE 0.9 % IV BOLUS (SEPSIS)
1000.0000 mL | Freq: Once | INTRAVENOUS | Status: AC
  Administered 2016-09-22: 1000 mL via INTRAVENOUS

## 2016-09-22 MED ORDER — POTASSIUM CHLORIDE CRYS ER 20 MEQ PO TBCR: 20.0000 meq | EXTENDED_RELEASE_TABLET | Freq: Every day | ORAL | 0 refills | Status: DC

## 2016-09-22 NOTE — Discharge Instructions (Signed)
You were seen in the ED today with difficulty breathing with exertion. Your chest x-ray shows an enlarged heart which points to possible congestive heart failure. This will need to be diagnosed by your PCP by ordering an cardiac ECHO. Make an appointment ASAP.   Return to the ED with any sudden worsening difficulty breathing, chest pain, fever, chills, or other concerning symptoms.

## 2016-09-22 NOTE — ED Notes (Signed)
Patient Alert and oriented X4. Stable and ambulatory. Patient verbalized understanding of the discharge instructions.  Patient belongings were taken by the patient.  

## 2016-09-22 NOTE — ED Notes (Signed)
Registration at bedside.

## 2016-09-22 NOTE — Patient Instructions (Signed)
     IF you received an x-ray today, you will receive an invoice from [facility]. Please contact [facility] at [phone] with questions or concerns regarding your invoice.   IF you received labwork today, you will receive an invoice from [facility]. Please contact [facility] at [phone] with questions or concerns regarding your invoice.   Our billing staff will not be able to assist you with questions regarding bills from these companies.  You will be contacted with the lab results as soon as they are available. The fastest way to get your results is to activate your My Chart account. Instructions are located on the last page of this paperwork. If you have not heard from us regarding the results in 2 weeks, please contact this office.     

## 2016-09-22 NOTE — Addendum Note (Signed)
Addended by: Tereasa Coop on: 09/22/2016 06:08 PM   Modules accepted: Orders

## 2016-09-22 NOTE — Progress Notes (Addendum)
09/22/2016 12:51 PM   DOB: Apr 07, 1965 / MRN: [id number]  SUBJECTIVE:  Miguel Pena is a 51 y.o. male presenting for SOB. Was seen for this previously and was treated with ABX though rads were negative.  This started a "couple months."  Also complains of a burning sensation in his chest.  Does note that going up one flight of stairs causes the SOB to get worse. Tells me that he is a prediabetic.  Take oxycodone, testosterone, and hydrocodone chronically. Smoked about 25 years with pack a day.   He has No Known Allergies.   He  has a past medical history of Bursitis of right shoulder; Dysrhythmia (1995); History of kidney stones; and Hypercholesterolemia.    He  reports that he quit smoking about 5 years ago. His smoking use included Cigarettes and Cigars. He has never used smokeless tobacco. He reports that he does not drink alcohol or use drugs. He  has no sexual activity history on file. The patient  has a past surgical history that includes No past surgeries; Inguinal hernia repair (Right, 02/16/2013); and Hernia repair (Right, 02/16/2013).  His family history is not on file.  Review of Systems  Constitutional: Negative for chills, diaphoresis and fever.  Eyes: Negative.   Respiratory: Positive for shortness of breath. Negative for cough, hemoptysis, sputum production and wheezing.   Cardiovascular: Positive for chest pain. Negative for orthopnea and leg swelling.  Gastrointestinal: Negative for abdominal pain, blood in stool, constipation, diarrhea, heartburn, melena, nausea and vomiting.  Genitourinary: Negative for flank pain.  Skin: Negative for rash.  Neurological: Negative for dizziness, sensory change, speech change, focal weakness and headaches.    The problem list and medications were reviewed and updated by myself where necessary and exist elsewhere in the encounter.   OBJECTIVE:  BP 119/83 (BP Location: Left Arm, Patient Position: Sitting, Cuff Size: Large)   Pulse (!)  105   Temp (!) 97.5 F (36.4 C) (Oral)   Resp 18   Ht 5' 10.67" (1.795 m)   Wt 193 lb 9.6 oz (87.8 kg)   SpO2 95%   BMI 27.26 kg/m   Physical Exam  Constitutional: He appears well-developed. He is active and cooperative.  Non-toxic appearance.  Cardiovascular: Regular rhythm, S1 normal, S2 normal, normal heart sounds, intact distal pulses and normal pulses.   No extrasystoles are present. Tachycardia present.  Exam reveals no gallop and no friction rub.   No murmur heard. Pulmonary/Chest: Effort normal. No stridor. No tachypnea. No respiratory distress. He has no wheezes. He has no rales.  Abdominal: He exhibits no distension.  Musculoskeletal: He exhibits no edema.  Neurological: He is alert.  Skin: Skin is warm and dry. He is not diaphoretic. No pallor.  Vitals reviewed.   Wt Readings from Last 3 Encounters:  09/22/16 193 lb 9.6 oz (87.8 kg)  05/26/13 172 lb 3.2 oz (78.1 kg)  03/10/13 173 lb 6.4 oz (78.7 kg)     No results found for this or any previous visit (from the past 72 hour(s)).  No results found.  ASSESSMENT AND PLAN:  Norval was seen today for shortness of breath.  Diagnoses and all orders for this visit:  SOB (shortness of breath): He has some EKG changes from previous which did show a LBBB however rate was normal.  Today he is tachycardic and appears to be in some distress. WE have given ASA, started O2 and a peripheral IV.  He will need to see cards for  this however the ED needs to determine the urgency.  -     EKG 12-Lead -     Cancel: DG Chest 2 View; Future -     Cancel: POCT CBC -     aspirin chewable tablet 324 mg; Chew 4 tablets (324 mg total) by mouth once. -     Insert peripheral IV -     sodium chloride 0.9 % bolus 1,000 mL; Inject 1,000 mLs into the vein once  The patient is advised to call or return to clinic if he does not see an improvement in symptoms, or to seek the care of the closest emergency department if he worsens with the above plan.     Philis Fendt, MHS, PA-C [facility] 09/22/2016 12:51 PM   Addendum: CHF in the ED. No meds prescribed and he was advised to see his PCP for an Echo. I am starting lasix, metop, and k-dur.  Urgent message out to [facility] card for follow up.  RTC in three days for follow up weight and labs.

## 2016-09-22 NOTE — ED Triage Notes (Signed)
Pt arrives from [facility] via [facility] reporting SOB worsening over past 2 months. Pt reports feeling of fullness around epigastic. EMS reports giving 324 ASA and 2 NTG, pressure went from 2/10 to 0/10.  Pt denies pain or discomfort at this time. Resp e/u.

## 2016-09-22 NOTE — ED Provider Notes (Signed)
[facility] DEPT Provider Note   CSN: [id number] Arrival date & time: 09/22/16  1319  History   Chief Complaint Chief Complaint  Patient presents with  . Shortness of Breath   HPI  Mr. Etsitty is a 51yo male with history of dysrhythmia and HLD who presents with progressive shortness of breath for the last couple months. He has had dyspnea with exertion as well as at rest, mostly when lying flat on his back. He has had to sleep on his side to be comfortable. Denies PND. He does endorse intermittent chest tightness, palpitations, and occasional diaphoresis. Also endorses occasional swelling in his lower extremities, a feeling of increased abdominal distention, and some wheezing. Denies fevers, cough, abdominal pain, changes in BMs, or dysuria. Was seen earlier today by urgent care and received 353m aspirin.  Not currently smoking, denies alcohol, or other illicit drugs. 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 No history of lung disease or heart disease aside from the dysrhythmia. Father has heart failure. Patient has never had an echocardiogram.  Past Medical History:  Diagnosis Date  . Bursitis of right shoulder   . Dysrhythmia 1995   "VT" per patient- states had neg stress test and no problems since  . History of kidney stones   . Hypercholesterolemia     Patient Active Problem List   Diagnosis Date Noted  . S/P right inguinal hernia repair 02/16/2013    Past Surgical History:  Procedure Laterality Date  . HERNIA REPAIR Right 02/16/2013   RIH repair  . INGUINAL HERNIA REPAIR Right 02/16/2013   Procedure: RIGHT INGUINAL HERNIA REPAIR ;  Surgeon: MPedro Earls MD;  Location: [facility] ORS;  Service: General;  Laterality: Right;  With MESH  . NO PAST SURGERIES         Home Medications    Prior to Admission medications   Medication Sig Start Date End Date Taking? Authorizing Provider  diphenhydrAMINE (BENADRYL)  25 MG tablet Take 25 mg by mouth every 6 (six) hours as needed for allergies.   Yes [provider]  HYDROcodone-acetaminophen (NORCO) 10-325 MG tablet Take 1 tablet by mouth every 4 (four) hours as needed. 09/11/16  Yes [provider]  hydrOXYzine (VISTARIL) 100 MG capsule Take 100 mg by mouth at bedtime. 08/27/16  Yes [provider]  Multiple Vitamin (MULTIVITAMIN) tablet Take 1 tablet by mouth daily.   Yes [provider]  nicotine polacrilex (NICORETTE) 2 MG gum Take 2 mg by mouth as needed for smoking cessation.   Yes [provider]  omega-3 acid ethyl esters (LOVAZA) 1 G capsule Take 1 g by mouth daily as needed (for vitamin).    Yes [provider]  OVER THE COUNTER MEDICATION Place 1 spray into both nostrils daily as needed. ZIACAM NASAL SWAB-   PRN    Yes [provider]  PROAIR HFA 108 (90 Base) MCG/ACT inhaler Inhale 3 puffs into the lungs daily as needed for shortness of breath.  08/25/16  Yes [provider]  testosterone cypionate (DEPOTESTOSTERONE CYPIONATE) 200 MG/ML injection Inject 400 mg into the muscle every 14 (fourteen) days. 06/29/16  Yes [provider]  zolpidem (AMBIEN) 5 MG tablet Take 5 mg by mouth at bedtime. 09/04/16  Yes [provider]  furosemide (LASIX) 40 MG tablet Take 1 tablet (40 mg total) by mouth daily. 09/22/16   CTereasa Coop PA-C  HYDROcodone-acetaminophen (NORCO) 7.5-325 MG  per tablet Take 1 tablet by mouth every 6 (six) hours as needed for moderate pain. 02/10/13   Johnathan Hausen, MD  metoprolol tartrate (LOPRESSOR) 25 MG tablet Take 2 tablets (50 mg total) by mouth 2 (two) times daily. 09/22/16   Tereasa Coop, PA-C  potassium chloride SA (K-DUR,KLOR-CON) 20 MEQ tablet Take 1 tablet (20 mEq total) by mouth daily. 09/22/16   Tereasa Coop, PA-C  zolpidem (AMBIEN) 10 MG tablet Take 5 mg by mouth at bedtime as needed for sleep.     [provider]    Family  History No family history on file.  Social History Social History  Substance Use Topics  . Smoking status: Former Smoker    Types: Cigarettes, Cigars    Quit date: 02/16/2011  . Smokeless tobacco: Never Used  . Alcohol use No     Comment: none since 9/11     Allergies   Patient has no known allergies.   Review of Systems Review of Systems  Unremarkable except as stated above in HPI.  Physical Exam Updated Vital Signs BP (!) 124/92   Pulse 97   Temp 98.2 F (36.8 C) (Oral)   Resp 18   Ht 5' 11"  (1.803 m)   Wt 87.5 kg (193 lb)   SpO2 95%   BMI 26.92 kg/m   Physical Exam GEN: Well-appearing pleasant male lying in bed in NAD; alert and oriented HENT: Moist mucous membranes. No visible lesions. EYES: PERRL. Sclera anicteric. RESP: ?Crackles in left mid-lung, otherwise clear to auscultation. No increased work of breathing. On RA. CV: Normal rate and regular rhythm. No murmurs, gallops, or rubs. JVD elevated to tip of earlobe. 1+ LE edema in bilateral ankles. ABD: Soft. Non-tender. Distended. Normoactive bowel sounds. EXT: 1+ BLE edema to ankles. Warm. NEURO: Cranial nerves II-XII grossly intact. Able to lift all four extremities against gravity. Speech fluent and appropriate.  ED Treatments / Results  Labs (all labs ordered are listed, but only abnormal results are displayed) Labs Reviewed  BASIC METABOLIC PANEL - Abnormal; Notable for the following:       Result Value   CO2 21 (*)    Glucose, Bld 102 (*)    Calcium 8.7 (*)    All other components within normal limits  BRAIN NATRIURETIC PEPTIDE - Abnormal; Notable for the following:    B Natriuretic Peptide 750.2 (*)    All other components within normal limits  CBC WITH DIFFERENTIAL/PLATELET  I-STAT TROPONIN, ED    EKG  EKG Interpretation  Date/Time:  Monday September 22 2016 13:20:03 EDT Ventricular Rate:  104 PR Interval:    QRS Duration: 169 QT Interval:  412 QTC Calculation: 542 R Axis:   56 Text  Interpretation:  Sinus tachycardia Probable left atrial enlargement Left bundle branch block No STEMI.  Confirmed by Nanda Quinton [phone]) on 09/22/2016 1:34:40 PM Also confirmed by Nanda Quinton [phone]), editor Philomena Doheny [phone])  on 09/22/2016 1:36:40 PM       Radiology Dg Chest 2 View  Result Date: 09/22/2016 CLINICAL DATA:  Shortness of Breath EXAM: CHEST  2 VIEW COMPARISON:  None. FINDINGS: Cardiomegaly with vascular congestion. No overt edema. No confluent opacities. No effusions or acute bony abnormality. IMPRESSION: Cardiomegaly, vascular congestion. Electronically Signed   By: Rolm Baptise M.D.   On: 09/22/2016 14:11    Procedures Procedures (including critical care time)  Medications Ordered in ED Medications  albuterol (PROVENTIL) (2.5 MG/3ML) 0.083% nebulizer solution 5 mg (5 mg Nebulization  Given 09/22/16 1346)     Initial Impression / Assessment and Plan / ED Course  I have reviewed the triage vital signs and the nursing notes.  Pertinent labs & imaging results that were available during my care of the patient were reviewed by me and considered in my medical decision making (see chart for details).  Mr. Karnes is a 52yo male with hx of dysrhythmia and HLD who presents with progressive dyspnea on exertion and orthopnea for the last 2 months. Afebrile, HDS. Weight 193lb on admission. Mildly fluid overloaded on exam. EKG with LBBB which is not new. History and exam concerning for new onset heart failure. Will evaluate with CBC, BMP, BNP, and troponin, as well as CXR.  CXR with cardiomegaly and vascular congestion. CBC and BMP wnl. Trop neg. BNP 750, consistent with new diagnosis of heart failure. Patient ambulated with sats remaining 90-94%. Lasix 1m given. Patient is stable for discharge with close follow up with outpatient primary care doctor for further evaluation. Return precautions provided.  Patient seen and discussed with Dr. LLaverta Baltimore  Final Clinical Impressions(s) / ED  Diagnoses   Final diagnoses:  SOB (shortness of breath)    New Prescriptions Discharge Medication List as of 09/22/2016  4:08 PM       HColbert Ewing MD 09/23/16 09449   LMargette Fast MD 09/23/16 1208

## 2016-09-22 NOTE — ED Notes (Signed)
Pt ambulated in hallway while checking pulse ox. Pulse ox ranged between 90%-94%.

## 2016-09-23 ENCOUNTER — Telehealth: Payer: Self-pay | Admitting: Physician Assistant

## 2016-09-23 NOTE — Telephone Encounter (Signed)
Spoke with patient, states that he is having increased SOB after taking Lasix this morning. Patient states that he has cardio appointment on Friday, but feels he can not make it until then. Awaiting response from provider at this time./ S.Alesha Jaffee,CMA

## 2016-09-23 NOTE — Telephone Encounter (Addendum)
PATIENT SAW MICHAEL CLARK Monday (09/22/16) AND HE GAVE HIM SOME MEDICINE. HE JUST WANTS MICHAEL TO CALL HIM. I COULD NOT GET ANY MORE DETAIL FROM HIM. (I AM GOING TO PUT THIS AS HIGH PRIORITY BECAUSE I AM NOT SURE WHAT THE PROBLEM IS?) BEST PHONE [phone]) CELL  MBC

## 2016-09-23 NOTE — Telephone Encounter (Signed)
After speaking with another provider and R.N, returned call to patient to advise that he be evaluated by E.R for dyspnea. Patient verbalized understanding and agreement./ S.Graci Hulce,CMA

## 2016-09-25 ENCOUNTER — Encounter: Payer: Self-pay | Admitting: Physician Assistant

## 2016-09-25 ENCOUNTER — Ambulatory Visit (INDEPENDENT_AMBULATORY_CARE_PROVIDER_SITE_OTHER): Payer: 59 | Admitting: Physician Assistant

## 2016-09-25 ENCOUNTER — Ambulatory Visit (INDEPENDENT_AMBULATORY_CARE_PROVIDER_SITE_OTHER): Payer: 59

## 2016-09-25 ENCOUNTER — Telehealth: Payer: Self-pay

## 2016-09-25 ENCOUNTER — Ambulatory Visit (HOSPITAL_COMMUNITY)
Admission: RE | Admit: 2016-09-25 | Discharge: 2016-09-25 | Disposition: A | Discharge status: Home / Self Care | Payer: 59 | Source: Ambulatory Visit | Attending: Physician Assistant | Admitting: Physician Assistant

## 2016-09-25 VITALS — BP 126/81 | HR 84 | Temp 97.7°F | Resp 16 | Ht 71.0 in | Wt 183.4 lb

## 2016-09-25 DIAGNOSIS — R7989 Other specified abnormal findings of blood chemistry: Secondary | ICD-10-CM | POA: Diagnosis present

## 2016-09-25 DIAGNOSIS — J90 Pleural effusion, not elsewhere classified: Secondary | ICD-10-CM | POA: Diagnosis not present

## 2016-09-25 DIAGNOSIS — I509 Heart failure, unspecified: Secondary | ICD-10-CM

## 2016-09-25 DIAGNOSIS — R079 Chest pain, unspecified: Secondary | ICD-10-CM | POA: Diagnosis present

## 2016-09-25 DIAGNOSIS — I517 Cardiomegaly: Secondary | ICD-10-CM | POA: Diagnosis not present

## 2016-09-25 MED ORDER — IOPAMIDOL (ISOVUE-370) INJECTION 76%
100.0000 mL | Freq: Once | INTRAVENOUS | Status: AC | PRN
  Administered 2016-09-25: 100 mL via INTRAVENOUS

## 2016-09-25 MED ORDER — IOPAMIDOL (ISOVUE-370) INJECTION 76%
INTRAVENOUS | Status: AC
  Filled 2016-09-25: qty 100

## 2016-09-25 NOTE — Telephone Encounter (Signed)
Spoke with pt. Pt has scheduled CT at [facility] today. They are fitting him in to the schedule when he arrives.

## 2016-09-25 NOTE — Progress Notes (Signed)
09/25/2016 5:18 PM   DOB: 12/13/1965 / MRN: [id number]  SUBJECTIVE:  Miguel Pena is a 51 y.o. male presenting for recheck of CHF.  Has been taking Lasix.  Feels much better. Daughter is here and tells me that he started smoking when he was a young teenager and quit when he was in his early 77s.  He is a non drinker.  Father did have CHF and has not had a heart attack to his knowledge. No sudden cardiac death on his mothers side. Did not take his potasium yesterday. Has had four doses of lasix 40 mg so far.  Did start testosterone about 1 year ago.   He has No Known Allergies.   He  has a past medical history of Bursitis of right shoulder; Dysrhythmia (1995); History of kidney stones; and Hypercholesterolemia.    He  reports that he quit smoking about 5 years ago. His smoking use included Cigarettes and Cigars. He has never used smokeless tobacco. He reports that he does not drink alcohol or use drugs. He  has no sexual activity history on file. The patient  has a past surgical history that includes No past surgeries; Inguinal hernia repair (Right, 02/16/2013); and Hernia repair (Right, 02/16/2013).  His family history is not on file.  Review of Systems  Constitutional: Negative for chills and fever.  Respiratory: Positive for cough. Negative for hemoptysis and shortness of breath.   Cardiovascular: Negative for chest pain and orthopnea.  Skin: Negative for rash.  Neurological: Negative for dizziness.    The problem list and medications were reviewed and updated by myself where necessary and exist elsewhere in the encounter.   OBJECTIVE:  BP 126/81 (BP Location: Right Arm, Patient Position: Sitting, Cuff Size: Normal)   Pulse 84   Temp 97.7 F (36.5 C) (Oral)   Resp 16   Ht 5' 11"  (1.803 m)   Wt 183 lb 6.4 oz (83.2 kg)   SpO2 100%   BMI 25.58 kg/m   Wt Readings from Last 3 Encounters:  09/25/16 183 lb 6.4 oz (83.2 kg)  09/22/16 193 lb (87.5 kg)  09/22/16 193 lb 9.6 oz (87.8  kg)     Physical Exam  Constitutional: He is oriented to person, place, and time. He appears well-developed. He is active and cooperative.  Non-toxic appearance.  Cardiovascular: Normal rate, regular rhythm, S1 normal, S2 normal, normal heart sounds, intact distal pulses and normal pulses.  Exam reveals no gallop and no friction rub.   No murmur heard. Pulmonary/Chest: Effort normal. No stridor. No tachypnea. No respiratory distress. He has no wheezes. He has no rales.  Abdominal: He exhibits no distension.  Musculoskeletal: He exhibits no edema.  Neurological: He is alert and oriented to person, place, and time.  Skin: Skin is warm and dry. He is not diaphoretic. No pallor.  Vitals reviewed.     Dg Chest 2 View  Result Date: 09/25/2016 CLINICAL DATA:  New onset CHF. EXAM: CHEST  2 VIEW COMPARISON:  09/22/2016 FINDINGS: There is mild bilateral interstitial thickening. There is no focal parenchymal opacity. There is no pleural effusion or pneumothorax. There is stable cardiomegaly. The osseous structures are unremarkable. IMPRESSION: Cardiomegaly with mild pulmonary vascular congestion. Electronically Signed   By: Kathreen Devoid   On: 09/25/2016 11:31   Ct Angio Chest W/cm &/or Wo Cm  Result Date: 09/25/2016 CLINICAL DATA:  Chest pain and shortness of breath for 2 months, diagnosed with recent pneumonia. EXAM: CT ANGIOGRAPHY CHEST  WITH CONTRAST TECHNIQUE: Multidetector CT imaging of the chest was performed using the standard protocol during bolus administration of intravenous contrast. Multiplanar CT image reconstructions and MIPs were obtained to evaluate the vascular anatomy. CONTRAST:  100 cc Isovue 370 COMPARISON:  None. FINDINGS: Cardiovascular: The peripheral segmental and subsegmental pulmonary arteries cannot be definitively characterized due to patient breathing motion artifact, however, there is no pulmonary embolism identified within the main, lobar or central segmental pulmonary  arteries bilaterally. Cardiomegaly. No pericardial effusion. No aortic aneurysm. There is insufficient contrast within the aorta to evaluate for dissection. Mediastinum/Nodes: Scattered small lymph nodes within the mediastinum. No enlarged lymph nodes identified within the mediastinum, perihilar or axillary regions. Esophagus is unremarkable. Trachea is unremarkable. Lungs/Pleura: Patchy ground-glass opacities within the left upper lobe, pneumonia versus asymmetric edema related to mild CHF. Bilateral pleural effusions, moderate in size on the right, small to moderate in size on the left. Upper Abdomen: Numerous hepatic cysts, liver incompletely imaged. No acute findings within the upper abdomen. Musculoskeletal: Osseous structures are unremarkable. Review of the MIP images confirms the above findings. IMPRESSION: 1. Cardiomegaly. 2. Patchy small ground-glass opacities within the left upper lobe, favored to represent asymmetric edema related to CHF. Differential for ground-glass opacity includes atypical pneumonias such as viral or fungal, interstitial pneumonias, edema related to volume overload/CHF, chronic interstitial diseases, hypersensitivity pneumonitis, and respiratory bronchiolitis. 3. Bilateral pleural effusions. Right pleural effusion is moderate in size. Left pleural effusion is small to moderate in size. 4. No pulmonary embolism seen, with mild study limitations detailed above. These results will be called to the ordering clinician or representative by the Radiology Department at the imaging location. Electronically Signed   By: Franki Cabot M.D.   On: 09/25/2016 17:05   Lab Results  Component Value Date   CREATININE 1.22 09/25/2016   BUN 10 09/25/2016   NA 140 09/25/2016   K 3.8 09/25/2016   CL 104 09/25/2016   CO2 20 09/25/2016   Lab Results  Component Value Date   CREATININE 1.22 09/25/2016   CREATININE 1.08 09/22/2016   CREATININE 0.75 02/15/2013      ASSESSMENT AND  PLAN:  [name] was seen today for follow-up.  Diagnoses and all orders for this visit:  Congestive heart failure, unspecified HF chronicity, unspecified heart failure type ([place]) -     Basic Metabolic Panel -     D-dimer, quantitative (not at [facility]) -     Lipid Panel -     Hemoglobin A1c -     TSH -     Testosterone -     DG Chest 2 View; Future -     Brain natriuretic peptide  Elevated d-dimer: Will check a stat CT angio.  Home if negative, ED if positive.  -     CT Angio Chest W/Cm &/Or Wo Cm; Future  Chest pain, unspecified type -     CT Angio Chest W/Cm &/Or Wo Cm; Future    The patient is advised to call or return to clinic if he does not see an improvement in symptoms, or to seek the care of the closest emergency department if he worsens with the above plan.   Philis Fendt, MHS, PA-C [facility] 09/25/2016 5:18 PM

## 2016-09-25 NOTE — Telephone Encounter (Signed)
Authorization performed Auth# [id number] good through 9/20-12/19

## 2016-09-25 NOTE — Patient Instructions (Addendum)
Continue the lasix at 40 mg daily and not more.  Try to take 1/2 to 1 K-dur daily.  Hold the metoprolol for now. See you on Monday.     IF you received an x-ray today, you will receive an invoice from [facility]. Please contact [facility] at [phone] with questions or concerns regarding your invoice.   IF you received labwork today, you will receive an invoice from [place]. Please contact [facility] at [phone] with questions or concerns regarding your invoice.   Our billing staff will not be able to assist you with questions regarding bills from these companies.  You will be contacted with the lab results as soon as they are available. The fastest way to get your results is to activate your My Chart account. Instructions are located on the last page of this paperwork. If you have not heard from [place] regarding the results in 2 weeks, please contact this office.

## 2016-09-26 LAB — BASIC METABOLIC PANEL
BUN/Creatinine Ratio: 8 — ABNORMAL LOW (ref 9–20)
BUN: 10 mg/dL (ref 6–24)
CALCIUM: 9.2 mg/dL (ref 8.7–10.2)
CO2: 20 mmol/L (ref 20–29)
CREATININE: 1.22 mg/dL (ref 0.76–1.27)
Chloride: 104 mmol/L (ref 96–106)
GFR, EST AFRICAN AMERICAN: 78 mL/min/{1.73_m2} (ref >59)
GFR, EST NON AFRICAN AMERICAN: 68 mL/min/{1.73_m2} (ref >59)
Glucose: 104 mg/dL — ABNORMAL HIGH (ref 65–99)
Potassium: 3.8 mmol/L (ref 3.5–5.2)
Sodium: 140 mmol/L (ref 134–144)

## 2016-09-26 LAB — LIPID PANEL
CHOL/HDL RATIO: 5.9 ratio — AB (ref 0.0–5.0)
Chol/HDL Ratio: 6 ratio — ABNORMAL HIGH (ref 0.0–5.0)
Cholesterol, Total: 154 mg/dL (ref 100–199)
Cholesterol, Total: 156 mg/dL (ref 100–199)
HDL: 26 mg/dL — ABNORMAL LOW (ref >39)
HDL: 26 mg/dL — AB (ref >39)
LDL CALC: 89 mg/dL (ref 0–99)
LDL Calculated: 87 mg/dL (ref 0–99)
TRIGLYCERIDES: 203 mg/dL — AB (ref 0–149)
Triglycerides: 205 mg/dL — ABNORMAL HIGH (ref 0–149)
VLDL CHOLESTEROL CAL: 41 mg/dL — AB (ref 5–40)
VLDL CHOLESTEROL CAL: 41 mg/dL — AB (ref 5–40)

## 2016-09-26 LAB — TESTOSTERONE
TESTOSTERONE: 1240 ng/dL — AB (ref 264–916)
Testosterone: 1162 ng/dL — ABNORMAL HIGH (ref 264–916)

## 2016-09-26 LAB — TSH
TSH: 1.39 u[IU]/mL (ref 0.450–4.500)
TSH: 1.42 u[IU]/mL (ref 0.450–4.500)

## 2016-09-26 LAB — HEMOGLOBIN A1C
ESTIMATED AVERAGE GLUCOSE: 105 mg/dL
HEMOGLOBIN A1C: 5.3 % (ref 4.8–5.6)

## 2016-09-26 LAB — BRAIN NATRIURETIC PEPTIDE: BNP: 416.3 pg/mL — ABNORMAL HIGH (ref 0.0–100.0)

## 2016-09-26 LAB — D-DIMER, QUANTITATIVE: D-DIMER: 0.92 mg/L FEU — ABNORMAL HIGH (ref 0.00–0.49)

## 2016-09-26 NOTE — Progress Notes (Signed)
Should be fine.  Ruled out a DVT yesterday. BMET holding up through diuresis so far and he is feeling a little bit better.  Thanks for your help. Philis Fendt, MS, PA-C 8:20 AM, 09/26/2016

## 2016-09-29 ENCOUNTER — Ambulatory Visit: Payer: 59 | Admitting: Physician Assistant

## 2016-09-30 ENCOUNTER — Ambulatory Visit: Payer: 59 | Admitting: Physician Assistant

## 2016-09-30 ENCOUNTER — Encounter: Payer: Self-pay | Admitting: Physician Assistant

## 2016-09-30 ENCOUNTER — Ambulatory Visit (INDEPENDENT_AMBULATORY_CARE_PROVIDER_SITE_OTHER): Payer: 59

## 2016-09-30 ENCOUNTER — Ambulatory Visit (INDEPENDENT_AMBULATORY_CARE_PROVIDER_SITE_OTHER): Payer: 59 | Admitting: Physician Assistant

## 2016-09-30 VITALS — BP 116/82 | HR 110 | Resp 16 | Ht 71.0 in | Wt 183.2 lb

## 2016-09-30 DIAGNOSIS — J029 Acute pharyngitis, unspecified: Secondary | ICD-10-CM

## 2016-09-30 DIAGNOSIS — I509 Heart failure, unspecified: Secondary | ICD-10-CM

## 2016-09-30 LAB — POCT RAPID STREP A (OFFICE): Rapid Strep A Screen: NEGATIVE

## 2016-09-30 MED ORDER — ASPIRIN EC 81 MG PO TBEC: 81.0000 mg | DELAYED_RELEASE_TABLET | Freq: Every day | ORAL | 0 refills | Status: DC

## 2016-09-30 MED ORDER — AMOXICILLIN 875 MG PO TABS: 875.0000 mg | ORAL_TABLET | Freq: Two times a day (BID) | ORAL | 0 refills | Status: AC

## 2016-09-30 MED ORDER — LIDOCAINE VISCOUS 2 % MT SOLN: OROMUCOSAL | 0 refills | Status: DC

## 2016-09-30 NOTE — Patient Instructions (Addendum)
Recent Results (from the past 2160 hour(s))  CBC     Status: Abnormal   Collection Time: 09/22/16 12:25 PM  Result Value Ref Range   WBC 7.7 3.4 - 10.8 x10E3/uL   RBC 5.07 4.14 - 5.80 x10E6/uL   Hemoglobin 15.2 13.0 - 17.7 g/dL   Hematocrit 42.3 37.5 - 51.0 %   MCV 83 79 - 97 fL   MCH 30.0 26.6 - 33.0 pg   MCHC 35.9 (H) 31.5 - 35.7 g/dL   RDW 14.7 12.3 - 15.4 %   Platelets 189 150 - 379 [id number]  Basic metabolic panel     Status: Abnormal   Collection Time: 09/22/16  2:32 PM  Result Value Ref Range   Sodium 137 135 - 145 mmol/L   Potassium 4.0 3.5 - 5.1 mmol/L   Chloride 110 101 - 111 mmol/L   CO2 21 (L) 22 - 32 mmol/L   Glucose, Bld 102 (H) 65 - 99 mg/dL   BUN 8 6 - 20 mg/dL   Creatinine, Ser 1.08 0.61 - 1.24 mg/dL   Calcium 8.7 (L) 8.9 - 10.3 mg/dL   GFR calc non Af Amer >60 >60 mL/min   GFR calc Af Amer >60 >60 mL/min    Comment: (NOTE) The eGFR has been calculated using the CKD EPI equation. This calculation has not been validated in all clinical situations. eGFR's persistently <60 mL/min signify possible Chronic Kidney Disease.    Anion gap 6 5 - 15  Brain natriuretic peptide     Status: Abnormal   Collection Time: 09/22/16  2:32 PM  Result Value Ref Range   B Natriuretic Peptide 750.2 (H) 0.0 - 100.0 pg/mL  CBC with Differential     Status: None   Collection Time: 09/22/16  2:32 PM  Result Value Ref Range   WBC 7.4 4.0 - 10.5 K/uL   RBC 4.77 4.22 - 5.81 MIL/uL   Hemoglobin 14.1 13.0 - 17.0 g/dL   HCT 40.8 39.0 - 52.0 %   MCV 85.5 78.0 - 100.0 fL   MCH 29.6 26.0 - 34.0 pg   MCHC 34.6 30.0 - 36.0 g/dL   RDW 14.3 11.5 - 15.5 %   Platelets 172 150 - 400 K/uL   Neutrophils Relative % 79 %   Neutro Abs 5.8 1.7 - 7.7 K/uL   Lymphocytes Relative 13 %   Lymphs Abs 0.9 0.7 - 4.0 K/uL   Monocytes Relative 7 %   Monocytes Absolute 0.5 0.1 - 1.0 K/uL   Eosinophils Relative 1 %   Eosinophils Absolute 0.1 0.0 - 0.7 K/uL   Basophils Relative 0 %   Basophils Absolute  0.0 0.0 - 0.1 K/uL  I-stat troponin, ED     Status: None   Collection Time: 09/22/16  2:38 PM  Result Value Ref Range   Troponin i, poc 0.01 0.00 - 0.08 ng/mL   Comment 3            Comment: Due to the release kinetics of cTnI, a negative result within the first hours of the onset of symptoms does not rule out myocardial infarction with certainty. If myocardial infarction is still suspected, repeat the test at appropriate intervals.   Basic Metabolic Panel     Status: Abnormal   Collection Time: 09/25/16 11:16 AM  Result Value Ref Range   Glucose 104 (H) 65 - 99 mg/dL   BUN 10 6 - 24 mg/dL   Creatinine, Ser 1.22 0.76 - 1.27 mg/dL   GFR  calc non Af Amer 68 >59 mL/min/1.73   GFR calc Af Amer 78 >59 mL/min/1.73   BUN/Creatinine Ratio 8 (L) 9 - 20   Sodium 140 134 - 144 mmol/L   Potassium 3.8 3.5 - 5.2 mmol/L   Chloride 104 96 - 106 mmol/L   CO2 20 20 - 29 mmol/L   Calcium 9.2 8.7 - 10.2 mg/dL  D-dimer, quantitative (not at [facility])     Status: Abnormal   Collection Time: 09/25/16 11:16 AM  Result Value Ref Range   D-DIMER 0.92 (H) 0.00 - 0.49 mg/L FEU    Comment: According to the assay manufacturer's published package insert, a normal (<0.50 mg/L FEU) D-dimer result in conjunction with a non-high clinical probability assessment, excludes deep vein thrombosis (DVT) and pulmonary embolism (PE) with high sensitivity. D-dimer values increase with age and this can make VTE exclusion of an older population difficult. To address this, the [place], based on best available evidence and recent guidelines, recommends that clinicians use age-adjusted D-dimer thresholds in patients greater than 3 years of age with: a) a low probability of PE who do not meet all Pulmonary Embolism Rule Out Criteria, or b) in those with intermediate probability of PE. The formula for an age-adjusted D-dimer cut-off is "age/100". For example, a 52 year old patient would have an  age-adjusted cut-off of 0.60 mg/L FEU and an 52 year old 0.80 mg/L FEU.   Lipid Panel     Status: Abnormal   Collection Time: 09/25/16 11:16 AM  Result Value Ref Range   Cholesterol, Total 154 100 - 199 mg/dL   Triglycerides 203 (H) 0 - 149 mg/dL   HDL 26 (L) >39 mg/dL   VLDL Cholesterol Cal 41 (H) 5 - 40 mg/dL   LDL Calculated 87 0 - 99 mg/dL   Chol/HDL Ratio 5.9 (H) 0.0 - 5.0 ratio    Comment:                                   T. Chol/HDL Ratio                                             Men  Women                               1/2 Avg.Risk  3.4    3.3                                   Avg.Risk  5.0    4.4                                2X Avg.Risk  9.6    7.1                                3X Avg.Risk 23.4   11.0   Hemoglobin A1c     Status: None   Collection Time: 09/25/16 11:16 AM  Result Value Ref Range   Hgb A1c MFr Bld CANCELED %    Comment: Please  refer to the following specimen for additional lab results. see [phone]          Prediabetes: 5.7 - 6.4          Diabetes: >6.4          Glycemic control for adults with diabetes: <7.0  Result canceled by the ancillary   TSH     Status: None   Collection Time: 09/25/16 11:16 AM  Result Value Ref Range   TSH 1.390 0.450 - 4.500 uIU/mL  Testosterone     Status: Abnormal   Collection Time: 09/25/16 11:16 AM  Result Value Ref Range   Testosterone 1,240 (H) 264 - 916 ng/dL    Comment: Adult male reference interval is based on a population of healthy nonobese males (BMI <30) between 22 and 87 years old. [place], [place] [phone]. PMID: [id number].   Lipid panel     Status: Abnormal   Collection Time: 09/25/16  2:08 PM  Result Value Ref Range   Cholesterol, Total 156 100 - 199 mg/dL   Triglycerides 205 (H) 0 - 149 mg/dL   HDL 26 (L) >39 mg/dL   VLDL Cholesterol Cal 41 (H) 5 - 40 mg/dL   LDL Calculated 89 0 - 99 mg/dL   Chol/HDL Ratio 6.0 (H) 0.0 - 5.0 ratio    Comment:                                    T. Chol/HDL Ratio                                             Men  Women                               1/2 Avg.Risk  3.4    3.3                                   Avg.Risk  5.0    4.4                                2X Avg.Risk  9.6    7.1                                3X Avg.Risk 23.4   11.0   Hemoglobin A1c     Status: None   Collection Time: 09/25/16  2:08 PM  Result Value Ref Range   Hgb A1c MFr Bld 5.3 4.8 - 5.6 %    Comment:          Prediabetes: 5.7 - 6.4          Diabetes: >6.4          Glycemic control for adults with diabetes: <7.0    Est. average glucose Bld gHb Est-mCnc 105 mg/dL  Testosterone     Status: Abnormal   Collection Time: 09/25/16  2:08 PM  Result Value Ref Range   Testosterone 1,162 (H) 264 - 916 ng/dL    Comment: Adult male reference interval is based  on a population of healthy nonobese males (BMI <30) between 39 and 78 years old. [place], [place] [phone]. PMID: [id number].   TSH     Status: None   Collection Time: 09/25/16  2:08 PM  Result Value Ref Range   TSH 1.420 0.450 - 4.500 uIU/mL  Brain natriuretic peptide     Status: Abnormal   Collection Time: 09/25/16  2:08 PM  Result Value Ref Range   BNP 416.3 (H) 0.0 - 100.0 pg/mL  POCT rapid strep A     Status: None   Collection Time: 09/30/16  3:52 PM  Result Value Ref Range   Rapid Strep A Screen Negative Negative      IF you received an x-ray today, you will receive an invoice from [facility]. Please contact [facility] at [phone] with questions or concerns regarding your invoice.   IF you received labwork today, you will receive an invoice from [facility]. Please contact [facility] at [phone] with questions or concerns regarding your invoice.   Our billing staff will not be able to assist you with questions regarding bills from these companies.  You will be contacted with the lab results as soon as they are available. The fastest way to get your results  is to activate your My Chart account. Instructions are located on the last page of this paperwork. If you have not heard from [place] regarding the results in 2 weeks, please contact this office.

## 2016-09-30 NOTE — Progress Notes (Signed)
09/30/2016 4:25 PM   DOB: [birth year] / MRN: [id number]  SUBJECTIVE:  Miguel Pena is a 51 y.o. male presenting for lab recheck.  Tells me that he feels 90% better after taking lasix.  Has been complaint with K-dur as well.  Tells me that when taking metoprolol he would become more SOB and had hot flashes, so he stopped this medication.  Tells me that he did some weed eating yesterday and did have some SOB that resolved with rest. He has no chest pain or SOB at this time. Does not take an aspirin.  Most recent testosterone was at ~1200.  I advised that he hold these injections until cardiology can see him and further assess him.   Has a sore throat that started after the dye from the CT angio. Tells me there is a white patch in his throat that is getting worse and makes it hard for him to swallow.   He has No Known Allergies.   He  has a past medical history of Bursitis of right shoulder; Dysrhythmia (1995); History of kidney stones; and Hypercholesterolemia.    He  reports that he quit smoking about 5 years ago. His smoking use included Cigarettes and Cigars. He has never used smokeless tobacco. He reports that he does not drink alcohol or use drugs. He  has no sexual activity history on file. The patient  has a past surgical history that includes No past surgeries; Inguinal hernia repair (Right, 02/16/2013); and Hernia repair (Right, 02/16/2013).  His family history is not on file.  Review of Systems  Constitutional: Negative for chills, diaphoresis and fever.  Respiratory: Negative for cough, hemoptysis, sputum production, shortness of breath and wheezing.   Cardiovascular: Negative for chest pain, orthopnea and leg swelling.  Gastrointestinal: Negative for nausea.  Skin: Negative for rash.  Neurological: Negative for dizziness.    The problem list and medications were reviewed and updated by myself where necessary and exist elsewhere in the encounter.   OBJECTIVE:  BP 116/82 (BP  Location: Left Arm, Patient Position: Sitting, Cuff Size: Normal)   Pulse (!) 110   Resp 16   Ht 5' 11"  (1.803 m)   Wt 183 lb 3.2 oz (83.1 kg)   SpO2 98%   BMI 25.55 kg/m   Physical Exam  Constitutional: He appears well-developed. He is active and cooperative.  Non-toxic appearance.  Cardiovascular: Normal rate, regular rhythm, S1 normal, S2 normal, normal heart sounds, intact distal pulses and normal pulses.  Exam reveals no gallop and no friction rub.   No murmur heard. Pulmonary/Chest: Effort normal. No stridor. No tachypnea. No respiratory distress. He has no wheezes. He has no rales.  Abdominal: He exhibits no distension.  Musculoskeletal: He exhibits no edema.  Neurological: He is alert.  Skin: Skin is warm and dry. He is not diaphoretic. No pallor.  Vitals reviewed.   Lab Results  Component Value Date   CHOL 156 09/25/2016   HDL 26 (L) 09/25/2016   LDLCALC 89 09/25/2016   TRIG 205 (H) 09/25/2016   CHOLHDL 6.0 (H) 09/25/2016   Lab Results  Component Value Date   HGBA1C 5.3 09/25/2016      Results for orders placed or performed in visit on 09/30/16 (from the past 72 hour(s))  POCT rapid strep A     Status: None   Collection Time: 09/30/16  3:52 PM  Result Value Ref Range   Rapid Strep A Screen Negative Negative    Dg Chest  2 View  Result Date: 09/30/2016 CLINICAL DATA:  New CHF, on diuretic for 1 week EXAM: CHEST  2 VIEW COMPARISON:  09/25/2016 FINDINGS: Enlargement of cardiac silhouette. Mediastinal contours and pulmonary vascularity normal. Bronchitic changes without infiltrate, pleural effusion or pneumothorax. No acute osseous findings. IMPRESSION: Enlargement of cardiac silhouette. Persistent bronchitic changes without infiltrate. Electronically Signed   By: Lavonia Dana M.D.   On: 09/30/2016 15:49    ASSESSMENT AND PLAN:  Miguel Pena was seen today for follow-up and sore throat.  Diagnoses and all orders for this visit:  Congestive heart failure, NYHA class  1, unspecified congestive heart failure type [facility]): He willing be seeing [facility] this Friday.  I expect he will receive an echo along with TM test at some point to determine if there is a ischemic component to his CHF.  I feel that there is probably some ischemic component here and think he would benefit from a cath.   -     DG Chest 2 View; Future -     Basic metabolic panel -     aspirin EC 81 MG tablet; Take 1 tablet (81 mg total) by mouth daily. -     Brain natriuretic peptide  Sore throat -     POCT rapid strep A -     Culture, Group A Strep -     lidocaine (XYLOCAINE) 2 % solution; Gargle and spit 5 mls every 1-3 hours for throat pain. -     amoxicillin (AMOXIL) 875 MG tablet; Take 1 tablet (875 mg total) by mouth 2 (two) times daily.    The patient is advised to call or return to clinic if he does not see an improvement in symptoms, or to seek the care of the closest emergency department if he worsens with the above plan.   Miguel Pena, MHS, PA-C [facility] 09/30/2016 4:25 PM

## 2016-10-01 LAB — BASIC METABOLIC PANEL
BUN/Creatinine Ratio: 14 (ref 9–20)
BUN: 16 mg/dL (ref 6–24)
CALCIUM: 9.7 mg/dL (ref 8.7–10.2)
CHLORIDE: 99 mmol/L (ref 96–106)
CO2: 23 mmol/L (ref 20–29)
Creatinine, Ser: 1.16 mg/dL (ref 0.76–1.27)
GFR, EST AFRICAN AMERICAN: 83 mL/min/{1.73_m2} (ref >59)
GFR, EST NON AFRICAN AMERICAN: 72 mL/min/{1.73_m2} (ref >59)
Glucose: 87 mg/dL (ref 65–99)
Potassium: 4.7 mmol/L (ref 3.5–5.2)
Sodium: 139 mmol/L (ref 134–144)

## 2016-10-01 LAB — BRAIN NATRIURETIC PEPTIDE: BNP: 401.8 pg/mL — ABNORMAL HIGH (ref 0.0–100.0)

## 2016-10-02 ENCOUNTER — Ambulatory Visit: Payer: 59 | Admitting: Physician Assistant

## 2016-10-02 LAB — CULTURE, GROUP A STREP: Strep A Culture: NEGATIVE

## 2016-10-09 LAB — BRAIN NATRIURETIC PEPTIDE: BNP: 613.7 pg/mL — AB (ref 0.0–100.0)

## 2016-10-24 DIAGNOSIS — I5020 Unspecified systolic (congestive) heart failure: Secondary | ICD-10-CM

## 2016-10-24 NOTE — H&P (Signed)
Miguel Pena 10/22/2016 9:53 AM Location: [facility] Patient #: [id number] DOB: 08/08/1965 Married / Language: Miguel Pena / Race: White Male   History of Present Illness Miguel Pena [name] MD; 10/22/2016 2:33 PM) Patient words: Last OV 10/15/2016; 9 day f/u for heart failure.  The patient is a 51 year old male who presents for a Follow-up for Congestive heart failure. 51 year old Caucasian male with hyperlipidemia, prior smoking history, testosterone deficiency, new diagnosis of dilated cardiomyopathy here for urgent care visit.  He is now tolerating medications fairly well. He still has NYHA class II-III dyspnea and occasional PND. Deneis chest pain. Leg swelling has increased over last few days. Denies any presyncope por syncope.  He mentions today that he has had recent change in hois bowel habits with more constipation and asks if he needs colonoscopy. He has rarely noticed any blood on stools.   Problem List/Past Medical Miguel Pena; 10/22/2016 1:52 PM) Laboratory examination (Z01.89)  Bursitis (M71.9)  Insomnia (G47.00)  Shortness of breath (R06.02)  Abnormal EKG (R94.31)  EKG 10/08/2016: Sinus tachycardia at 107 bpm, LBBB, no further analysis due to LBBB Malaise and fatigue (R53.81, R53.83)  Moderate to severe mitral regurgitation (Q00.8)  Acute systolic congestive heart failure, NYHA class 3 (I50.21)  Echocardiogram 10/09/2016: Left ventricle cavity is severely dilated. Dilated cardiomyopathy. Severely decreased global wall motion. Assessment of LV thrombus limited without contrast study, but no obvious thrombus seen. Diastolic function assessment limited due to E/A fusion and severity of mitral regurgitation. Calculated EF 15%. Left atrial cavity is moderately dilated. Mild (Grade I) aortic regurgitation. Moderate to severe mitral regurgitation. Moderate regurgitation. Pulmonary artery systolic pressure is estimated at 40-45 mm Hg. Dilated cardiomyopathy  (I42.0)  ACC/AHA stage C NYHA class II-III Echocardiogram 10/09/2016: Left ventricle cavity is severely dilated. Dilated cardiomyopathy. Severely decreased global wall motion. Assessment of LV thrombus limited without contrast study, but no obvious thrombus seen. Diastolic function assessment limited due to E/A fusion and severity of mitral regurgitation. Calculated EF 15%. Left atrial cavity is moderately dilated. Mild (Grade I) aortic regurgitation. Moderate to severe mitral regurgitation. Moderate regurgitation. Pulmonary artery systolic pressure is estimated at 40-45 mm Hg.  Allergies Miguel Pena; 10/22/2016 1:52 PM) No Known Drug Allergies [10/08/2016]:  Family History Miguel Pena; 10/22/2016 1:52 PM) Mother  In good health. Chol, no heart attacks or strokes, no known cardiovascular conditions Father  In poor health. CHF, on dialysis, no heart attacks or strokes, no known cardiovascular conditions Sister 1  younger-good health, no heart attacks or strokes, no known cardiovascular conditions Brother 1  older, no heart attacks or strokes, no known cardiovascular conditions  Social History Miguel Pena; 10/22/2016 1:52 PM) Current tobacco use  Former smoker. chews nicotine gum, quit smoking 20 yrs ago Non Drinker/No Alcohol Use  Marital status  Married. Living Situation  Lives with spouse. Number of Children  2.  Past Surgical History Miguel Pena; 10/22/2016 1:52 PM) Hernia Repair [[id number]]:  Medication History Miguel Pena; 10/22/2016 1:59 PM) Furosemide (40MG Tablet, 1 (one) Tablet Oral daily, Taken starting 10/16/2016) Active. (40 mg and 20 mg alternate day) Bisoprolol Fumarate (5MG Tablet,  Tablet Oral once daily, Taken starting 10/15/2016) Active. Potassium Chloride Crys ER (20MEQ Tablet ER, 1 Tablet Tablet Oral daily, Taken starting 10/13/2016) Active. Entresto (24-26MG Tablet, 1 (one) Tablet Tablet Tablet Oral two times daily, Taken  starting 10/08/2016) Active. HydrOXYzine Pamoate (100MG Capsule, 1 Oral bedtime prn) Active. Zolpidem Tartrate (5MG Tablet, 1 Oral nightly) Active. Amoxicillin (875MG Tablet, Oral two times daily)  Active. Hydrocodone-Acetaminophen (10-325MG Tablet, 1 Oral every 4 hours prn) Active. Aspirin (81MG Tablet DR, 1 Oral daily) Active. Zicam Allergy Relief (Nasal prn) Active. Medications Reconciled (verbally with pt; w/list)  Diagnostic Studies History Miguel Pena; 10/22/2016 1:52 PM) Carotid Doppler [09/2016]: Echocardiogram [10/09/2016]: Left ventricle cavity is severely dilated. Dilated cardiomyopathy. Severely decreased global wall motion. Assessment of LV thrombus limited without contrast study, but no obvious thrombus seen. Diastolic function assessment limited due to E/A fusion and severity of mitral regurgitation. Calculated EF 15%. Left atrial cavity is moderately dilated. Mild (Grade I) aortic regurgitation. Moderate to severe mitral regurgitation. Moderate regurgitation. Pulmonary artery systolic pressure is estimated at 40-45 mm Hg. Chest X-ray [09/2016]:    Review of Systems Miguel Pena Shiri Hodapp MD; 10/22/2016 2:35 PM) General Present- Fatigue. Not Present- Appetite Loss and Weight Gain. Respiratory Not Present- Chronic Cough and Wakes up from Sleep Wheezing or Short of Breath. Cardiovascular Present- Difficulty Breathing On Exertion, Edema, Orthopnea and Paroxysmal Nocturnal Dyspnea. Not Present- Chest Pain and Palpitations. Gastrointestinal Present- Abdominal Swelling. Not Present- Black, Tarry Stool and Difficulty Swallowing. Musculoskeletal Not Present- Decreased Range of Motion and Muscle Atrophy. Neurological Not Present- Attention Deficit. Psychiatric Not Present- Personality Changes and Suicidal Ideation. Endocrine Not Present- Cold Intolerance and Heat Intolerance. Hematology Not Present- Abnormal Bleeding. All other systems negative  Vitals Miguel Malta  Pena; 10/22/2016 2:01 PM) 10/22/2016 1:55 PM Weight: 186 lb Height: 71in Body Surface Area: 2.04 m Body Mass Index: 25.94 kg/m  Pulse: 77 (Regular)  P.OX: 98% (Room air) BP: 105/68 (Sitting, Left Arm, Standard)       Physical Exam Miguel Pena Krystelle Prashad MD; 10/22/2016 2:35 PM) General Mental Status-Alert. General Appearance-Cooperative and Appears stated age. Build & Nutrition-Moderately built. Hydration-Edematous(B/l lower extremity 1+).  Head and Neck Thyroid Gland Characteristics - normal size and consistency and no palpable nodules.  Chest and Lung Exam Chest and lung exam reveals -quiet, even and easy respiratory effort with no use of accessory muscles, non-tender and on auscultation, normal breath sounds, no adventitious sounds.  Cardiovascular Cardiovascular examination reveals -carotid auscultation reveals no bruits, abdominal aorta auscultation reveals no bruits and no prominent pulsation, femoral artery auscultation bilaterally reveals normal pulses, no bruits, no thrills, normal pedal pulses bilaterally and no digital clubbing, cyanosis, edema, increased warmth or tenderness. Inspection Jugular vein - Right - Inspection Normal. Auscultation Murmurs & Other Heart Sounds: Murmur: Murmur 2 - Location - Sternal Border - Left. Timing - Diastolic. Grade - I/VI. Grade - II/VI. Character - Holosystolic. Radiation - Left axilla.  Abdomen Palpation/Percussion Normal exam - Non Tender and No hepatosplenomegaly.  Neurologic Neurologic evaluation reveals -alert and oriented x 3 with no impairment of recent or remote memory. Motor-Grossly intact without any focal deficits.  Musculoskeletal Global Assessment Left Lower Extremity - no deformities, masses or tenderness, no known fractures. Right Lower Extremity - no deformities, masses or tenderness, no known fractures.    Assessment & Plan Miguel Pena Gaylin Bulthuis MD; 10/22/2016 2:34 PM) Dilated  cardiomyopathy (I42.0) Story: ACC/AHA stage C NYHA class II-III  Echocardiogram 10/09/2016: Left ventricle cavity is severely dilated. Dilated cardiomyopathy. Severely decreased global wall motion. Assessment of LV thrombus limited without contrast study, but no obvious thrombus seen. Diastolic function assessment limited due to E/A fusion and severity of mitral regurgitation. Calculated EF 15%. Left atrial cavity is moderately dilated. Mild (Grade I) aortic regurgitation. Moderate to severe mitral regurgitation. Moderate regurgitation. Pulmonary artery systolic pressure is estimated at 40-45 mm Hg. Current Plans Changed Entresto 49-51MG, 1 (one) Tablet two times daily, #  60, 60 days starting 10/22/2016, Ref. x1. Shortness of breath (R06.02) Malaise and fatigue (R53.81) Moderate to severe mitral regurgitation (I34.0) Laboratory examination (U59.92) Story: 10/16/2016: Creatinine 1.46, EGFR 55/63, potassium 4.7, BMP normal.  09/25/2016: CBC normal. BNP 401.8. Creatinine 1.16, EGFR 72/83, potassium 4.7, BMP otherwise normal. Hemoglobin A1c 5.3%. Cholesterol 156, triglycerides 205, HDL 26, LDL 89. TSH 1.4.  Note:. Recommendations:  51 year old Caucasian male with new diagnosis of dilated cardiomyopathy Increase Entresto to 49-51 mg twice daily, will follow up pn BMPO ofrom today. Continue biosoprolol 2,5 mg daily. Lasix 40 mg once daily and 80 mg daily on days with inrease leg swelling. Given his leg edema today. I have asked him to take 80 mg daily lasix for next 3 days. Will peform left and right heat cath and coronary angiography with possibile angioplasty, as appropriate, on 10/28/2016/ Follow with me after that. Will add spirololactone at that visit. Given that his K is 4.7 on recent check, can hold supplement.  Constipation could be related to medications. While he needs screening colonoscopy, will defer the timing to PCP and GI. Probably would have to wait until stabilization of his  cardiomyopathy, unless any clinical change.  CC: Dr. Renato Shin; CC: Philis Fendt, PA-C  Signed electronically by Vernell Leep, MD (10/22/2016 2:36 PM)

## 2016-10-24 NOTE — Progress Notes (Signed)
Labs 10/22/2016: BUN/creatinine 16/1.13.  EGFR 74.  Sodium 139, potassium 4.7. H/H 15.5/45, and.  MCV 85.  Platelets 171. INR 1.1.

## 2016-10-28 ENCOUNTER — Observation Stay (HOSPITAL_COMMUNITY)
Admission: RE | Admit: 2016-10-28 | Discharge: 2016-10-29 | Disposition: A | Discharge status: Home / Self Care | Payer: 59 | Source: Ambulatory Visit | Attending: Cardiology | Admitting: Cardiology

## 2016-10-28 ENCOUNTER — Encounter (HOSPITAL_COMMUNITY)
Admission: RE | Disposition: A | Discharge status: Home / Self Care | Payer: Self-pay | Source: Ambulatory Visit | Attending: Cardiology

## 2016-10-28 ENCOUNTER — Encounter (HOSPITAL_COMMUNITY): Payer: Self-pay | Admitting: General Practice

## 2016-10-28 DIAGNOSIS — I2510 Atherosclerotic heart disease of native coronary artery without angina pectoris: Principal | ICD-10-CM | POA: Insufficient documentation

## 2016-10-28 DIAGNOSIS — Z87891 Personal history of nicotine dependence: Secondary | ICD-10-CM | POA: Insufficient documentation

## 2016-10-28 DIAGNOSIS — Z7982 Long term (current) use of aspirin: Secondary | ICD-10-CM | POA: Insufficient documentation

## 2016-10-28 DIAGNOSIS — I5020 Unspecified systolic (congestive) heart failure: Secondary | ICD-10-CM

## 2016-10-28 DIAGNOSIS — I509 Heart failure, unspecified: Secondary | ICD-10-CM | POA: Diagnosis not present

## 2016-10-28 DIAGNOSIS — I340 Nonrheumatic mitral (valve) insufficiency: Secondary | ICD-10-CM | POA: Insufficient documentation

## 2016-10-28 DIAGNOSIS — E785 Hyperlipidemia, unspecified: Secondary | ICD-10-CM | POA: Insufficient documentation

## 2016-10-28 DIAGNOSIS — G4700 Insomnia, unspecified: Secondary | ICD-10-CM | POA: Insufficient documentation

## 2016-10-28 DIAGNOSIS — I255 Ischemic cardiomyopathy: Secondary | ICD-10-CM | POA: Diagnosis not present

## 2016-10-28 DIAGNOSIS — Z9861 Coronary angioplasty status: Secondary | ICD-10-CM

## 2016-10-28 DIAGNOSIS — I420 Dilated cardiomyopathy: Secondary | ICD-10-CM | POA: Diagnosis not present

## 2016-10-28 DIAGNOSIS — Z955 Presence of coronary angioplasty implant and graft: Secondary | ICD-10-CM

## 2016-10-28 HISTORY — PX: INTRAVASCULAR PRESSURE WIRE/FFR STUDY: CATH118243

## 2016-10-28 HISTORY — DX: Cardiac murmur, unspecified: R01.1

## 2016-10-28 HISTORY — PX: CORONARY STENT INTERVENTION: CATH118234

## 2016-10-28 HISTORY — DX: Dilated cardiomyopathy: I42.0

## 2016-10-28 HISTORY — PX: ULTRASOUND GUIDANCE FOR VASCULAR ACCESS: SHX6516

## 2016-10-28 HISTORY — PX: RIGHT/LEFT HEART CATH AND CORONARY ANGIOGRAPHY: CATH118266

## 2016-10-28 HISTORY — DX: Heart failure, unspecified: I50.9

## 2016-10-28 HISTORY — PX: CORONARY ANGIOPLASTY WITH STENT PLACEMENT: SHX49

## 2016-10-28 LAB — POCT I-STAT 3, ART BLOOD GAS (G3+)
Acid-Base Excess: 2 mmol/L (ref 0.0–2.0)
BICARBONATE: 25.9 mmol/L (ref 20.0–28.0)
O2 Saturation: 95 %
PCO2 ART: 39.1 mmHg (ref 32.0–48.0)
PH ART: 7.43 (ref 7.350–7.450)
PO2 ART: 72 mmHg — AB (ref 83.0–108.0)
TCO2: 27 mmol/L (ref 22–32)

## 2016-10-28 LAB — POCT I-STAT 3, VENOUS BLOOD GAS (G3P V)
BICARBONATE: 24.9 mmol/L (ref 20.0–28.0)
O2 Saturation: 64 %
PCO2 VEN: 40.4 mmHg — AB (ref 44.0–60.0)
PH VEN: 7.398 (ref 7.250–7.430)
PO2 VEN: 33 mmHg (ref 32.0–45.0)
TCO2: 26 mmol/L (ref 22–32)

## 2016-10-28 LAB — POCT ACTIVATED CLOTTING TIME
ACTIVATED CLOTTING TIME: 213 s
ACTIVATED CLOTTING TIME: 219 s
Activated Clotting Time: 191 seconds

## 2016-10-28 SURGERY — RIGHT/LEFT HEART CATH AND CORONARY ANGIOGRAPHY
Anesthesia: LOCAL

## 2016-10-28 MED ORDER — ROSUVASTATIN CALCIUM 20 MG PO TABS: 20.0000 mg | ORAL_TABLET | Freq: Every day | ORAL | Status: DC

## 2016-10-28 MED ORDER — HYDROXYZINE PAMOATE 50 MG PO CAPS
100.0000 mg | ORAL_CAPSULE | Freq: Every day | ORAL | Status: DC
  Administered 2016-10-28: 100 mg via ORAL
  Filled 2016-10-28: qty 2

## 2016-10-28 MED ORDER — ANGIOPLASTY BOOK
Freq: Once | Status: AC
  Administered 2016-10-28: 21:00:00
  Filled 2016-10-28: qty 1

## 2016-10-28 MED ORDER — MIDAZOLAM HCL 2 MG/2ML IJ SOLN
INTRAMUSCULAR | Status: DC | PRN
  Administered 2016-10-28: 0.5 mg via INTRAVENOUS
  Administered 2016-10-28: 1 mg via INTRAVENOUS
  Administered 2016-10-28: 0.5 mg via INTRAVENOUS

## 2016-10-28 MED ORDER — BISOPROLOL FUMARATE 5 MG PO TABS
5.0000 mg | ORAL_TABLET | Freq: Every day | ORAL | Status: DC
  Administered 2016-10-28 – 2016-10-29 (×2): 5 mg via ORAL
  Filled 2016-10-28 (×3): qty 1

## 2016-10-28 MED ORDER — CLOPIDOGREL BISULFATE 75 MG PO TABS: 75.0000 mg | ORAL_TABLET | Freq: Every day | ORAL | Status: DC

## 2016-10-28 MED ORDER — LIDOCAINE HCL 2 % IJ SOLN
INTRAMUSCULAR | Status: DC | PRN
  Administered 2016-10-28: 1 mL
  Administered 2016-10-28: 3 mL

## 2016-10-28 MED ORDER — BISOPROLOL FUMARATE 5 MG PO TABS: 5.0000 mg | ORAL_TABLET | Freq: Every day | ORAL | 1 refills | Status: DC

## 2016-10-28 MED ORDER — CLOPIDOGREL BISULFATE 75 MG PO TABS
75.0000 mg | ORAL_TABLET | Freq: Every day | ORAL | Status: DC
  Administered 2016-10-29: 09:00:00 75 mg via ORAL
  Filled 2016-10-28: qty 1

## 2016-10-28 MED ORDER — ASPIRIN 81 MG PO CHEW: 81.0000 mg | CHEWABLE_TABLET | Freq: Every day | ORAL | 3 refills | Status: DC

## 2016-10-28 MED ORDER — HEPARIN SODIUM (PORCINE) 1000 UNIT/ML IJ SOLN
INTRAMUSCULAR | Status: DC | PRN
  Administered 2016-10-28: 2000 [IU] via INTRAVENOUS

## 2016-10-28 MED ORDER — SODIUM CHLORIDE 0.9% FLUSH: 3.0000 mL | Freq: Two times a day (BID) | INTRAVENOUS | Status: DC

## 2016-10-28 MED ORDER — OMEGA-3-ACID ETHYL ESTERS 1 G PO CAPS: 1.0000 g | ORAL_CAPSULE | Freq: Every day | ORAL | Status: DC | PRN

## 2016-10-28 MED ORDER — ASPIRIN 81 MG PO CHEW
CHEWABLE_TABLET | ORAL | Status: AC
  Filled 2016-10-28: qty 1

## 2016-10-28 MED ORDER — HYDRALAZINE HCL 20 MG/ML IJ SOLN: 5.0000 mg | INTRAMUSCULAR | Status: AC | PRN

## 2016-10-28 MED ORDER — FENTANYL CITRATE (PF) 100 MCG/2ML IJ SOLN
INTRAMUSCULAR | Status: AC
  Filled 2016-10-28: qty 2

## 2016-10-28 MED ORDER — IOPAMIDOL (ISOVUE-370) INJECTION 76%
INTRAVENOUS | Status: DC | PRN
  Administered 2016-10-28: 110 mL via INTRA_ARTERIAL

## 2016-10-28 MED ORDER — CLOPIDOGREL BISULFATE 300 MG PO TABS
ORAL_TABLET | ORAL | Status: AC
  Filled 2016-10-28: qty 2

## 2016-10-28 MED ORDER — MIDAZOLAM HCL 2 MG/2ML IJ SOLN
INTRAMUSCULAR | Status: AC
  Filled 2016-10-28: qty 2

## 2016-10-28 MED ORDER — SODIUM CHLORIDE 0.9 % IV SOLN
INTRAVENOUS | Status: DC
  Administered 2016-10-28: 12:00:00 via INTRAVENOUS

## 2016-10-28 MED ORDER — HYDROCODONE-ACETAMINOPHEN 10-325 MG PO TABS
1.0000 | ORAL_TABLET | ORAL | Status: DC | PRN
  Administered 2016-10-28: 1 via ORAL
  Filled 2016-10-28: qty 1

## 2016-10-28 MED ORDER — FENTANYL CITRATE (PF) 100 MCG/2ML IJ SOLN
INTRAMUSCULAR | Status: DC | PRN
  Administered 2016-10-28: 25 ug via INTRAVENOUS
  Administered 2016-10-28 (×2): 12.5 ug via INTRAVENOUS

## 2016-10-28 MED ORDER — ASPIRIN 81 MG PO CHEW
81.0000 mg | CHEWABLE_TABLET | Freq: Every day | ORAL | Status: DC
  Administered 2016-10-29: 81 mg via ORAL
  Filled 2016-10-28: qty 1

## 2016-10-28 MED ORDER — VERAPAMIL HCL 2.5 MG/ML IV SOLN
INTRAVENOUS | Status: AC
  Filled 2016-10-28: qty 2

## 2016-10-28 MED ORDER — SODIUM CHLORIDE 0.9% FLUSH: 3.0000 mL | INTRAVENOUS | Status: DC | PRN

## 2016-10-28 MED ORDER — ADENOSINE 12 MG/4ML IV SOLN
INTRAVENOUS | Status: AC
  Filled 2016-10-28: qty 16

## 2016-10-28 MED ORDER — VERAPAMIL HCL 2.5 MG/ML IV SOLN
INTRA_ARTERIAL | Status: DC | PRN
  Administered 2016-10-28: 5 mL via INTRA_ARTERIAL

## 2016-10-28 MED ORDER — SODIUM CHLORIDE 0.9 % IV SOLN: 250.0000 mL | INTRAVENOUS | Status: DC | PRN

## 2016-10-28 MED ORDER — HEPARIN SODIUM (PORCINE) 1000 UNIT/ML IJ SOLN
INTRAMUSCULAR | Status: AC
  Filled 2016-10-28: qty 1

## 2016-10-28 MED ORDER — SODIUM CHLORIDE 0.9% FLUSH
3.0000 mL | Freq: Two times a day (BID) | INTRAVENOUS | Status: DC
  Administered 2016-10-28: 22:00:00 3 mL via INTRAVENOUS

## 2016-10-28 MED ORDER — HEPARIN SODIUM (PORCINE) 1000 UNIT/ML IJ SOLN
INTRAMUSCULAR | Status: DC | PRN
  Administered 2016-10-28 (×2): 3000 [IU] via INTRAVENOUS
  Administered 2016-10-28 (×2): 2000 [IU] via INTRAVENOUS
  Administered 2016-10-28: 3000 [IU] via INTRAVENOUS

## 2016-10-28 MED ORDER — HEPARIN (PORCINE) IN NACL 2-0.9 UNIT/ML-% IJ SOLN
INTRAMUSCULAR | Status: AC | PRN
  Administered 2016-10-28: 1000 mL
  Administered 2016-10-28: 500 mL

## 2016-10-28 MED ORDER — CLOPIDOGREL BISULFATE 75 MG PO TABS: 75.0000 mg | ORAL_TABLET | Freq: Every day | ORAL | 2 refills | Status: DC

## 2016-10-28 MED ORDER — IOPAMIDOL (ISOVUE-370) INJECTION 76%
INTRAVENOUS | Status: AC
  Filled 2016-10-28: qty 50

## 2016-10-28 MED ORDER — ZOLPIDEM TARTRATE 5 MG PO TABS
5.0000 mg | ORAL_TABLET | Freq: Every day | ORAL | Status: DC
  Administered 2016-10-28: 5 mg via ORAL
  Filled 2016-10-28: qty 1

## 2016-10-28 MED ORDER — LABETALOL HCL 5 MG/ML IV SOLN: 10.0000 mg | INTRAVENOUS | Status: AC | PRN

## 2016-10-28 MED ORDER — LIDOCAINE HCL 2 % IJ SOLN
INTRAMUSCULAR | Status: AC
  Filled 2016-10-28: qty 10

## 2016-10-28 MED ORDER — ACETAMINOPHEN 325 MG PO TABS: 650.0000 mg | ORAL_TABLET | ORAL | Status: DC | PRN

## 2016-10-28 MED ORDER — HEPARIN (PORCINE) IN NACL 2-0.9 UNIT/ML-% IJ SOLN
INTRAMUSCULAR | Status: AC
  Filled 2016-10-28: qty 1000

## 2016-10-28 MED ORDER — ROSUVASTATIN CALCIUM 20 MG PO TABS: 20.0000 mg | ORAL_TABLET | Freq: Every day | ORAL | 3 refills | Status: DC

## 2016-10-28 MED ORDER — ASPIRIN 81 MG PO CHEW
81.0000 mg | CHEWABLE_TABLET | ORAL | Status: AC
Start: 2016-10-28 — End: 2016-10-28
  Administered 2016-10-28: 81 mg via ORAL

## 2016-10-28 MED ORDER — FUROSEMIDE 40 MG PO TABS: 40.0000 mg | ORAL_TABLET | ORAL | 0 refills | Status: DC | PRN

## 2016-10-28 MED ORDER — NICOTINE POLACRILEX 2 MG MT GUM
2.0000 mg | CHEWING_GUM | OROMUCOSAL | Status: DC | PRN
  Administered 2016-10-28: 21:00:00 2 mg via ORAL
  Filled 2016-10-28 (×2): qty 1

## 2016-10-28 MED ORDER — CLOPIDOGREL BISULFATE 300 MG PO TABS
ORAL_TABLET | ORAL | Status: DC | PRN
  Administered 2016-10-28: 600 mg via ORAL

## 2016-10-28 MED ORDER — IOPAMIDOL (ISOVUE-370) INJECTION 76%
INTRAVENOUS | Status: AC
  Filled 2016-10-28: qty 100

## 2016-10-28 MED ORDER — ONDANSETRON HCL 4 MG/2ML IJ SOLN: 4.0000 mg | Freq: Four times a day (QID) | INTRAMUSCULAR | Status: DC | PRN

## 2016-10-28 SURGICAL SUPPLY — 25 items
BALLN SAPPHIRE 3.0X10 (BALLOONS) ×3
BALLOON SAPPHIRE 3.0X10 (BALLOONS) IMPLANT
CATH BALLN WEDGE 5F 110CM (CATHETERS) ×1 IMPLANT
CATH INFINITI 5 FR JL3.5 (CATHETERS) ×1 IMPLANT
CATH INFINITI JR4 5F (CATHETERS) ×1 IMPLANT
CATH OPTITORQUE TIG 4.0 5F (CATHETERS) ×1 IMPLANT
CATH VISTA GUIDE 6FR XB3.5 (CATHETERS) ×1 IMPLANT
COVER PRB 48X5XTLSCP FOLD TPE (BAG) IMPLANT
COVER PROBE 5X48 (BAG) ×3
DEVICE RAD COMP TR BAND LRG (VASCULAR PRODUCTS) ×1 IMPLANT
GLIDESHEATH SLEND SS 6F .021 (SHEATH) ×1 IMPLANT
GUIDEWIRE INQWIRE 1.5J.035X260 (WIRE) IMPLANT
GUIDEWIRE PRESSURE COMET II (WIRE) ×1 IMPLANT
INQWIRE 1.5J .035X260CM (WIRE) ×3
KIT ENCORE 26 ADVANTAGE (KITS) ×1 IMPLANT
KIT HEART LEFT (KITS) ×3 IMPLANT
PACK CARDIAC CATHETERIZATION (CUSTOM PROCEDURE TRAY) ×3 IMPLANT
SHEATH GLIDE SLENDER 4/5FR (SHEATH) ×1 IMPLANT
STENT PROMUS PREM MR 3.5X16 (Permanent Stent) ×1 IMPLANT
TRANSDUCER W/STOPCOCK (MISCELLANEOUS) ×3 IMPLANT
TUBING CIL FLEX 10 FLL-RA (TUBING) ×3 IMPLANT
VALVE GUARDIAN II ~~LOC~~ HEMO (MISCELLANEOUS) ×1 IMPLANT
WIRE COUGAR XT STRL 190CM (WIRE) ×1 IMPLANT
WIRE EMERALD 3MM-J .025X260CM (WIRE) ×1 IMPLANT
WIRE HI TORQ VERSACORE-J 145CM (WIRE) ×1 IMPLANT

## 2016-10-28 NOTE — Interval H&P Note (Signed)
History and Physical Interval Note:  10/28/2016 2:40 PM  Miguel Pena  has presented today for surgery, with the diagnosis of cm - hf  The various methods of treatment have been discussed with the patient and family. After consideration of risks, benefits and other options for treatment, the patient has consented to  Procedure(s): RIGHT/LEFT HEART CATH AND CORONARY ANGIOGRAPHY (N/A) as a surgical intervention .  The patient's history has been reviewed, patient examined, no change in status, stable for surgery.  I have reviewed the patient's chart and labs.  Questions were answered to the patient's satisfaction.     Indication:  1. Known or suspected cardiomyopathy with or without heart failure A (7) Indication: 93; Score 7      Manish J Patwardhan

## 2016-10-28 NOTE — Progress Notes (Signed)
1 Vessel Disease PCI CABG   No proximal LAD involvement, No proximal left dominant LCX involvement M (6); Indication 3 M (4); Indication 3   Proximal left dominant LCX involvement M (6); Indication 6 M (6); Indication 6   Proximal LAD involvement M (6); Indication 6 M (6); Indication 6   newline 2 Vessel Disease  No proximal LAD involvement A (7); Indication 9 M (5); Indication 9   Proximal LAD involvement A (7); Indication 12 A (7); Indication 12   newline 3 Vessel Disease  Low disease complexity (e.g., focal stenoses, SYNTAX <=22) Not rated Not rated   Intermediate or high disease complexity (e.g., SYNTAX >=23) Not rated Not rated   newline Left Main Disease  Isolated LMCA disease: ostial or midshaft A (7); Indication 24 A (9); Indication 24   Isolated LMCA disease: bifurcation involvement M (5); Indication 25 A (9); Indication 25   LMCA ostial or midshaft, concurrent low disease burden multivessel disease (e.g., 1-2 additional focal stenoses, SYNTAX <=22) A (7); Indication 26 A (9); Indication 26   LMCA ostial or midshaft, concurrent intermediate or high disease burden multivessel disease (e.g., 1-2 additional bifurcation stenoses, long stenoses, SYNTAX >=23) M (4); Indication 27 A (9); Indication 27   LMCA bifurcation involvement, concurrent low disease burden multivessel disease (e.g., 1-2 additional focal stenoses, SYNTAX <=22) M (5); Indication 28 A (9); Indication 28   LMCA bifurcation involvement, concurrent intermediate or high disease burden multivessel disease (e.g., 1-2 additional bifurcation stenoses, long stenoses, SYNTAX >=23) R (3); Indication 29 A (9); Indication 29

## 2016-10-28 NOTE — Progress Notes (Signed)
Site area: rt AC Site Prior to Removal:  Level 0 Pressure Applied For:15 min Manual:  yes  Patient Status During Pull:  stable Post Pull Site:  Level 0 Post Pull Instructions Given:  yes Post Pull Pulses Present: NA Dressing Applied:  tegaderm/coban Bedrest begins @ NA Comments:

## 2016-10-29 ENCOUNTER — Encounter (HOSPITAL_COMMUNITY): Payer: Self-pay | Admitting: Cardiology

## 2016-10-29 DIAGNOSIS — I2510 Atherosclerotic heart disease of native coronary artery without angina pectoris: Secondary | ICD-10-CM | POA: Diagnosis not present

## 2016-10-29 LAB — BASIC METABOLIC PANEL
ANION GAP: 10 (ref 5–15)
BUN: 18 mg/dL (ref 6–20)
CHLORIDE: 107 mmol/L (ref 101–111)
CO2: 20 mmol/L — AB (ref 22–32)
CREATININE: 1.08 mg/dL (ref 0.61–1.24)
Calcium: 9.2 mg/dL (ref 8.9–10.3)
GFR calc non Af Amer: >60 mL/min (ref >60)
Glucose, Bld: 99 mg/dL (ref 65–99)
Potassium: 4.3 mmol/L (ref 3.5–5.1)
Sodium: 137 mmol/L (ref 135–145)

## 2016-10-29 LAB — CBC
HEMATOCRIT: 45.7 % (ref 39.0–52.0)
Hemoglobin: 15.3 g/dL (ref 13.0–17.0)
MCH: 28.8 pg (ref 26.0–34.0)
MCHC: 33.5 g/dL (ref 30.0–36.0)
MCV: 85.9 fL (ref 78.0–100.0)
Platelets: 150 10*3/uL (ref 150–400)
RBC: 5.32 MIL/uL (ref 4.22–5.81)
RDW: 14 % (ref 11.5–15.5)
WBC: 6.4 10*3/uL (ref 4.0–10.5)

## 2016-10-29 MED ORDER — CLOPIDOGREL BISULFATE 75 MG PO TABS: 75.0000 mg | ORAL_TABLET | Freq: Every day | ORAL | 2 refills | Status: DC

## 2016-10-29 NOTE — Discharge Summary (Signed)
Physician Discharge Summary  Patient ID: Miguel Pena MRN: [id number] DOB/AGE: October 21, 1965 51 y.o.  Admit date: 10/28/2016 Discharge date: 10/29/2016  Admission Diagnoses: Nonischemic cardiomyopathy CAD s/p PCI to LCx   Discharge Diagnoses:  Active Problems:   Heart failure with reduced ejection fraction, NYHA class III ([place])   Post PTCA   Discharged Condition: good  Hospital Course:  Patient underwent left and right heart catheterization given his new diagnosis of retinopathy.  He was found to have left circumflex stenosis with physiological significance as demonstrated by resting Pd/Pa 0.64. He underwent successful PCI. I have increased his bisoprolol to 5 mg. He currently is using samples of Entresto 49-51 mg. I will give him prescription during his follow up next week, along with spirinolactone initiation. He has done well post PCI and referred to cardiac rehab,  Consults: None  Significant Diagnostic Studies: labs:  Results for Miguel, Pena (MRN [id number]) as of 10/29/2016 08:50  Ref. Range 10/29/2016 02:04  WBC Latest Ref Range: 4.0 - 10.5 K/uL 6.4  RBC Latest Ref Range: 4.22 - 5.81 MIL/uL 5.32  Hemoglobin Latest Ref Range: 13.0 - 17.0 g/dL 15.3  HCT Latest Ref Range: 39.0 - 52.0 % 45.7  MCV Latest Ref Range: 78.0 - 100.0 fL 85.9  MCH Latest Ref Range: 26.0 - 34.0 pg 28.8  MCHC Latest Ref Range: 30.0 - 36.0 g/dL 33.5  RDW Latest Ref Range: 11.5 - 15.5 % 14.0  Platelets Latest Ref Range: 150 - 400 K/uL 150   Results for Miguel, Pena (MRN [id number]) as of 10/29/2016 08:50  Ref. Range 10/29/2016 02:04  WBC Latest Ref Range: 4.0 - 10.5 K/uL 6.4  RBC Latest Ref Range: 4.22 - 5.81 MIL/uL 5.32  Hemoglobin Latest Ref Range: 13.0 - 17.0 g/dL 15.3  HCT Latest Ref Range: 39.0 - 52.0 % 45.7  MCV Latest Ref Range: 78.0 - 100.0 fL 85.9  MCH Latest Ref Range: 26.0 - 34.0 pg 28.8  MCHC Latest Ref Range: 30.0 - 36.0 g/dL 33.5  RDW Latest Ref Range: 11.5 - 15.5 % 14.0   Platelets Latest Ref Range: 150 - 400 K/uL 150    Treatments:  Procedures   CORONARY STENT INTERVENTION  INTRAVASCULAR PRESSURE WIRE/FFR STUDY  RIGHT/LEFT HEART CATH AND CORONARY ANGIOGRAPHY  Conclusion   Nonischemic cardiomyopathy. Single-vessel obstructive coronary artery disease proximal large OM1. Successful PTCA and drug-eluting stent placement proximal premier 3.5 x 16 mm.  Recommendation: Admit to telemetry overnight. Increase bisoprolol to 5 mg. Discharge 10/29/2016     Discharge Exam: Blood pressure 120/65, pulse 71, temperature (!) 97.2 F (36.2 C), temperature source Oral, resp. rate (!) 23, height 5' 11"  (1.803 m), weight 81.6 kg (180 lb), SpO2 98 %. General Mental Status-Alert. General Appearance-Cooperative and Appears stated age. Build & Nutrition-Moderately built. Hydration-Edematous(B/l lower extremity 1+).  Head and Neck Thyroid Gland Characteristics - normal size and consistency and no palpable nodules.  Chest and Lung Exam Chest and lung exam reveals -quiet, even and easy respiratory effort with no use of accessory muscles, non-tender and on auscultation, normal breath sounds, no adventitious sounds.  Cardiovascular Cardiovascular examination reveals -carotid auscultation reveals no bruits, abdominal aorta auscultation reveals no bruits and no prominent pulsation, femoral artery auscultation bilaterally reveals normal pulses, no bruits, no thrills, normal pedal pulses bilaterally and no digital clubbing, cyanosis, edema, increased warmth or tenderness. Inspection Jugular vein - Right - Inspection Normal. Auscultation Murmurs & Other Heart Sounds: Murmur: Murmur 2 - Location - Sternal Border - Left. Timing -  Diastolic. Grade - I/VI. Grade - II/VI. Character - Holosystolic. Radiation - Left axilla. Rt radial cath site with no bleeding, hematoma. Good capillary refill  Abdomen Palpation/Percussion Normal exam - Non Tender and No  hepatosplenomegaly.  Neurologic Neurologic evaluation reveals -alert and oriented x 3 with no impairment of recent or remote memory. Motor-Grossly intact without any focal deficits.  Musculoskeletal Global Assessment Left Lower Extremity - no deformities, masses or tenderness, no known fractures. Right Lower Extremity - no deformities, masses or tenderness, no known fractures.   Disposition: 01-Home or Self Care  Discharge Instructions    AMB Referral to Cardiac Rehabilitation - Phase II    Complete by:  As directed    Diagnosis:  PTCA   AMB Referral to Phase II Cardiac Rehab    Complete by:  As directed    Diagnosis:   PTCA Coronary Stents Heart Failure (see criteria below if ordering Phase II)     Heart Failure Type:  Chronic Systolic & Diastolic   Call MD for:  difficulty breathing, headache or visual disturbances    Complete by:  As directed    Call MD for:  redness, tenderness, or signs of infection (pain, swelling, redness, odor or green/yellow discharge around incision site)    Complete by:  As directed    Call MD for:  severe uncontrolled pain    Complete by:  As directed    Diet - low sodium heart healthy    Complete by:  As directed    Diet - low sodium heart healthy    Complete by:  As directed    Increase activity slowly    Complete by:  As directed    Increase activity slowly    Complete by:  As directed      Allergies as of 10/29/2016   No Known Allergies     Medication List    STOP taking these medications   aspirin EC 81 MG tablet Replaced by:  aspirin 81 MG chewable tablet     TAKE these medications   aspirin 81 MG chewable tablet Chew 1 tablet (81 mg total) by mouth daily. Replaces:  aspirin EC 81 MG tablet   bisoprolol 5 MG tablet Commonly known as:  ZEBETA Take 1 tablet (5 mg total) by mouth daily. What changed:  how much to take   clopidogrel 75 MG tablet Commonly known as:  PLAVIX Take 1 tablet (75 mg total) by mouth  daily.   furosemide 40 MG tablet Commonly known as:  LASIX Take 1 tablet (40 mg total) by mouth as needed for edema. What changed:  when to take this  reasons to take this   HYDROcodone-acetaminophen 10-325 MG tablet Commonly known as:  NORCO Take 1 tablet by mouth every 4 (four) hours as needed.   hydrOXYzine 100 MG capsule Commonly known as:  VISTARIL Take 100 mg by mouth at bedtime.   nicotine polacrilex 2 MG gum Commonly known as:  NICORETTE Take 2 mg by mouth as needed for smoking cessation.   omega-3 acid ethyl esters 1 g capsule Commonly known as:  LOVAZA Take 1 g by mouth daily as needed (for vitamin).   potassium chloride SA 20 MEQ tablet Commonly known as:  K-DUR,KLOR-CON Take 1 tablet (20 mEq total) by mouth daily.   rosuvastatin 20 MG tablet Commonly known as:  CRESTOR Take 1 tablet (20 mg total) by mouth daily at 6 PM.   zolpidem 5 MG tablet Commonly known as:  AMBIEN Take  5 mg by mouth at bedtime.      Follow-up Information    [name], MD Follow up on 11/06/2016.   Specialty:  Cardiology Contact information: [address] [place] [address] [phone]           Signed: [name] 10/29/2016, 8:47 AM

## 2016-10-29 NOTE — Progress Notes (Signed)
TR BAND REMOVAL  LOCATION:    right radial  DEFLATED PER PROTOCOL:    Yes.    TIME BAND OFF / DRESSING APPLIED:    2040   SITE UPON ARRIVAL:    Level 0  SITE AFTER BAND REMOVAL:    Level 0  CIRCULATION SENSATION AND MOVEMENT:    Within Normal Limits   Yes.    COMMENTS:   Radial site teaching reinforced, pt verbalizes and demonstrates understanding.

## 2016-10-29 NOTE — Care Management Note (Signed)
Case Management Note  Patient Details  Name: Miguel Pena MRN: [id number] Date of Birth: 04/17/1965  Subjective/Objective:  From home, pta indep, s/p coronary stent intervention, will be on plavix.  For dc home today,no needs.                 Action/Plan: DC home today,no needs.  Expected Discharge Date:  10/29/16               Expected Discharge Plan:  Home/Self Care  In-House Referral:     Discharge planning Services  CM Consult  Post Acute Care Choice:    Choice offered to:     DME Arranged:    DME Agency:     HH Arranged:    HH Agency:     Status of Service:  Completed, signed off  If discussed at [employer] of Stay Meetings, dates discussed:    Additional Comments:  Zenon Mayo, RN 10/29/2016, 9:34 AM

## 2016-10-29 NOTE — Progress Notes (Signed)
CARDIAC REHAB PHASE I   PRE:  Rate/Rhythm: 65 SR  BP:  Sitting: 120/65   MODE:  Ambulation: 800 ft   POST:  Rate/Rhythm: 85 SR  BP:  Sitting: 137/70         SaO2: 100 RA  Pt ambulated 800 ft on RA, independent, steady gait, tolerated well with no complaints. Completed PCI/stent education.  Reviewed risk factors, PCI book, anti-platelet therapy, stent card, activity restrictions, ntg, exercise, heart healthy diet, CHF booklet and zone tool, daily weights, sodium restrictions and phase 2 cardiac rehab. Pt verbalized understanding. Pt agrees to phase 2 cardiac rehab referral, will send to [facility] per pt request. Pt to bed per pt request after walk, call bell within reach.     [place], RN, BSN 10/29/2016 9:25 AM

## 2016-10-30 ENCOUNTER — Telehealth (HOSPITAL_COMMUNITY): Payer: Self-pay

## 2016-10-30 NOTE — Telephone Encounter (Signed)
Patients insurance with [employer] is active. No co-pay, deductible amount is $2,000/$1,316.61 has been met, out of pocket amount is $7,150/$2,527.37 has been met, 20% co-insurance, and no pre-authorization is required. Passport/reference # [id number]

## 2016-10-30 NOTE — Telephone Encounter (Signed)
Called and spoke with patient in regards Cardiac Rehab - He does think that he needs to do program but isn't sure he can participate the full 12 weeks as he expecting to go back to work once fully recovered. He wants to think about it and wants a call back tomorrow evening.

## 2016-10-31 NOTE — Telephone Encounter (Signed)
Called to f/u with patient in regards to Cardiac Rehab - He stated that he forgot to talk to his wife about the program and to give him a call back on Monday.

## 2016-11-03 NOTE — Telephone Encounter (Signed)
Attempted to call patient in regards to Cardiac Rehab - LM on VM 

## 2016-11-10 NOTE — Telephone Encounter (Signed)
Called and spoke with patient in regards to Cardiac Rehab - Patient is interested. Scheduled orientation on 11/18/16 at 1:30pm. Patient will be attending the 1:15pm exc class.

## 2016-11-14 ENCOUNTER — Telehealth (HOSPITAL_COMMUNITY): Payer: Self-pay | Admitting: Pharmacy Technician

## 2016-11-14 NOTE — Telephone Encounter (Signed)
Cardiac Rehab Medication Review by a Pharmacist  Does the patient  feel that his/her medications are working for him/her?  yes  Has the patient been experiencing any side effects to the medications prescribed?  no  Does the patient measure his/her own blood pressure or blood glucose at home?  no   Does the patient have any problems obtaining medications due to transportation or finances?   no  Understanding of regimen: good Understanding of indications: fair Potential of compliance: good    Pharmacist comments: Mr Enrique states that he had some minor side effects of his new medications as his body adjusted and now is having no issues.  He states that he is not currently checking his BP at home, and will talk with his cardiologist about home monitoring.   Carnella Guadalajara 11/14/2016 5:47 PM

## 2016-11-18 ENCOUNTER — Encounter (HOSPITAL_COMMUNITY)
Admission: RE | Admit: 2016-11-18 | Discharge: 2016-11-18 | Disposition: A | Discharge status: Home / Self Care | Payer: 59 | Source: Ambulatory Visit | Attending: Cardiology | Admitting: Cardiology

## 2016-11-18 ENCOUNTER — Encounter (HOSPITAL_COMMUNITY): Payer: Self-pay

## 2016-11-18 VITALS — BP 122/60 | HR 95 | Ht 70.0 in | Wt 189.8 lb

## 2016-11-18 DIAGNOSIS — Z7982 Long term (current) use of aspirin: Secondary | ICD-10-CM | POA: Insufficient documentation

## 2016-11-18 DIAGNOSIS — Z79899 Other long term (current) drug therapy: Secondary | ICD-10-CM | POA: Insufficient documentation

## 2016-11-18 DIAGNOSIS — Z955 Presence of coronary angioplasty implant and graft: Secondary | ICD-10-CM | POA: Insufficient documentation

## 2016-11-18 DIAGNOSIS — Z87891 Personal history of nicotine dependence: Secondary | ICD-10-CM | POA: Insufficient documentation

## 2016-11-18 DIAGNOSIS — I420 Dilated cardiomyopathy: Secondary | ICD-10-CM | POA: Insufficient documentation

## 2016-11-18 DIAGNOSIS — I509 Heart failure, unspecified: Secondary | ICD-10-CM | POA: Insufficient documentation

## 2016-11-18 NOTE — Progress Notes (Signed)
Cardiac Individual Treatment Plan  Patient Details  Name: Miguel Pena MRN: [id number] Date of Birth: 21-Nov-1965 Referring Provider:     CARDIAC REHAB PHASE II ORIENTATION from 11/18/2016 in [place]  Referring Provider  Vernell Leep, MD.      Initial Encounter Date:    CARDIAC REHAB PHASE II ORIENTATION from 11/18/2016 in [place]  Date  11/18/16  Referring Provider  Vernell Leep, MD.      Visit Diagnosis: Stented coronary artery  Patient's Home Medications on Admission:  Current Outpatient Medications:  .  aspirin 81 MG chewable tablet, Chew 1 tablet (81 mg total) by mouth daily., Disp: 90 tablet, Rfl: 3 .  bisoprolol (ZEBETA) 5 MG tablet, Take 1 tablet (5 mg total) by mouth daily., Disp: 30 tablet, Rfl: 1 .  clopidogrel (PLAVIX) 75 MG tablet, Take 1 tablet (75 mg total) by mouth daily., Disp: 90 tablet, Rfl: 2 .  furosemide (LASIX) 40 MG tablet, Take 1 tablet (40 mg total) by mouth as needed for edema., Disp: 30 tablet, Rfl: 0 .  HYDROcodone-acetaminophen (NORCO) 10-325 MG tablet, Take 1 tablet by mouth every 4 (four) hours as needed., Disp: , Rfl:  .  hydrOXYzine (VISTARIL) 100 MG capsule, Take 100 mg by mouth at bedtime., Disp: , Rfl:  .  nicotine polacrilex (NICORETTE) 2 MG gum, Take 2 mg by mouth as needed for smoking cessation., Disp: , Rfl:  .  omega-3 acid ethyl esters (LOVAZA) 1 G capsule, Take 1 g by mouth daily as needed (for vitamin). , Disp: , Rfl:  .  potassium chloride SA (K-DUR,KLOR-CON) 20 MEQ tablet, Take 1 tablet (20 mEq total) by mouth daily., Disp: 30 tablet, Rfl: 0 .  rosuvastatin (CRESTOR) 20 MG tablet, Take 1 tablet (20 mg total) by mouth daily at 6 PM., Disp: 30 tablet, Rfl: 3 .  sacubitril-valsartan (ENTRESTO) 49-51 MG, Take 1 tablet 2 (two) times daily by mouth., Disp: , Rfl:  .  zolpidem (AMBIEN) 5 MG tablet, Take 5 mg by mouth at bedtime., Disp: , Rfl:   Past Medical  History: Past Medical History:  Diagnosis Date  . Bursitis of right shoulder   . CHF (congestive heart failure) ([place])    [name] 10/24/2016  . Dilated cardiomyopathy ([place])    [name] 10/24/2016  . Dysrhythmia 1995   "VT" per patient- states had neg stress test and no problems since  . Heart murmur    "when I was young"  . History of kidney stones   . Hypercholesterolemia     Tobacco Use: Social History   Tobacco Use  Smoking Status Former Smoker  . Packs/day: 1.00  . Years: 12.00  . Pack years: 12.00  . Types: Cigarettes, Cigars  . Last attempt to quit: 02/16/2011  . Years since quitting: 5.7  Smokeless Tobacco Former Systems developer  . Types: Snuff  Tobacco Comment   10/28/2016 'chewed for a couple years after I quit smoking"    Labs: Recent Review Flowsheet Data    Labs for ITP Cardiac and Pulmonary Rehab Latest Ref Rng & Units 09/25/2016 09/25/2016 10/28/2016 10/28/2016   Cholestrol 100 - 199 mg/dL 154 156 - -   LDLCALC 0 - 99 mg/dL 87 89 - -   HDL >39 mg/dL 26(L) 26(L) - -   Trlycerides 0 - 149 mg/dL 203(H) 205(H) - -   Hemoglobin A1c 4.8 - 5.6 % CANCELED 5.3 - -   PHART 7.350 - 7.450 - - -  7.430   PCO2ART 32.0 - 48.0 mmHg - - - 39.1   HCO3 20.0 - 28.0 mmol/L - - 24.9 25.9   TCO2 22 - 32 mmol/L - - 26 27   O2SAT % - - 64.0 95.0      Capillary Blood Glucose: No results found for: GLUCAP   Exercise Target Goals: Date: 11/18/16  Exercise Program Goal: Individual exercise prescription set with THRR, safety & activity barriers. Participant demonstrates ability to understand and report RPE using BORG scale, to self-measure pulse accurately, and to acknowledge the importance of the exercise prescription.  Exercise Prescription Goal: Starting with aerobic activity 30 plus minutes a day, 3 days per week for initial exercise prescription. Provide home exercise prescription and guidelines that participant acknowledges understanding prior to discharge.  Activity Barriers & Risk  Stratification: Activity Barriers & Cardiac Risk Stratification - 11/18/16 1548      Activity Barriers & Cardiac Risk Stratification   Activity Barriers  Other (comment)    Comments  Shoulder/ mid back pain.    Cardiac Risk Stratification  High       6 Minute Walk: 6 Minute Walk    Row Name 11/18/16 1547         6 Minute Walk   Phase  Initial     Distance  1488 feet     Walk Time  6 minutes     # of Rest Breaks  0     MPH  2.82     METS  4.47     RPE  11     VO2 Peak  15.65     Symptoms  No     Resting HR  95 bpm     Resting BP  122/60     Resting Oxygen Saturation   97 %     Exercise Oxygen Saturation  during 6 min walk  96 %     Max Ex. HR  119 bpm     Max Ex. BP  132/70     2 Minute Post BP  120/63        Oxygen Initial Assessment:   Oxygen Re-Evaluation:   Oxygen Discharge (Final Oxygen Re-Evaluation):   Initial Exercise Prescription: Initial Exercise Prescription - 11/18/16 1500      Date of Initial Exercise RX and Referring Provider   Date  11/18/16    Referring Provider  Vernell Leep, MD.      Treadmill   MPH  2.6    Grade  1    Minutes  10    METs  3.35      Bike   Level  1.6    Minutes  10    METs  4.55      NuStep   Level  4    SPM  85    Minutes  10    METs  3      Prescription Details   Frequency (times per week)  3    Duration  Progress to 30 minutes of continuous aerobic without signs/symptoms of physical distress      Intensity   THRR 40-80% of Max Heartrate  67-134    Ratings of Perceived Exertion  11-13    Perceived Dyspnea  0-4      Progression   Progression  Continue to progress workloads to maintain intensity without signs/symptoms of physical distress.      Resistance Training   Training Prescription  Yes    Weight  4lbs  Reps  10-15       Perform Capillary Blood Glucose checks as needed.  Exercise Prescription Changes:   Exercise Comments:   Exercise Goals and Review: Exercise Goals    Row  Name 11/18/16 1551             Exercise Goals   Increase Physical Activity  Yes       Intervention  Provide advice, education, support and counseling about physical activity/exercise needs.;Develop an individualized exercise prescription for aerobic and resistive training based on initial evaluation findings, risk stratification, comorbidities and participant's personal goals.       Expected Outcomes  Achievement of increased cardiorespiratory fitness and enhanced flexibility, muscular endurance and strength shown through measurements of functional capacity and personal statement of participant.       Increase Strength and Stamina  Yes       Intervention  Provide advice, education, support and counseling about physical activity/exercise needs.;Develop an individualized exercise prescription for aerobic and resistive training based on initial evaluation findings, risk stratification, comorbidities and participant's personal goals.       Expected Outcomes  Achievement of increased cardiorespiratory fitness and enhanced flexibility, muscular endurance and strength shown through measurements of functional capacity and personal statement of participant.       Able to understand and use rate of perceived exertion (RPE) scale  Yes       Intervention  Provide education and explanation on how to use RPE scale       Expected Outcomes  Short Term: Able to use RPE daily in rehab to express subjective intensity level;Long Term:  Able to use RPE to guide intensity level when exercising independently       Knowledge and understanding of Target Heart Rate Range (THRR)  Yes       Intervention  Provide education and explanation of THRR including how the numbers were predicted and where they are located for reference       Expected Outcomes  Short Term: Able to state/look up THRR;Long Term: Able to use THRR to govern intensity when exercising independently;Short Term: Able to use daily as guideline for intensity in  rehab       Able to check pulse independently  Yes       Intervention  Provide education and demonstration on how to check pulse in carotid and radial arteries.;Review the importance of being able to check your own pulse for safety during independent exercise       Expected Outcomes  Long Term: Able to check pulse independently and accurately;Short Term: Able to explain why pulse checking is important during independent exercise       Understanding of Exercise Prescription  Yes       Intervention  Provide education, explanation, and written materials on patient's individual exercise prescription       Expected Outcomes  Short Term: Able to explain program exercise prescription;Long Term: Able to explain home exercise prescription to exercise independently          Exercise Goals Re-Evaluation :    Discharge Exercise Prescription (Final Exercise Prescription Changes):   Nutrition:  Target Goals: Understanding of nutrition guidelines, daily intake of sodium <1575m, cholesterol <2061m calories 30% from fat and 7% or less from saturated fats, daily to have 5 or more servings of fruits and vegetables.  Biometrics: Pre Biometrics - 11/18/16 1552      Pre Biometrics   Height  5' 10"  (1.778 m)    Weight  189 lb  13.1 oz (86.1 kg)    Waist Circumference  39 inches    Hip Circumference  39.75 inches    Waist to Hip Ratio  0.98 %    BMI (Calculated)  27.24    Triceps Skinfold  14 mm    % Body Fat  25.9 %    Grip Strength  40.5 kg    Flexibility  11 in    Single Leg Stand  5.5 seconds        Nutrition Therapy Plan and Nutrition Goals:   Nutrition Discharge: Nutrition Scores:   Nutrition Goals Re-Evaluation:   Nutrition Goals Re-Evaluation:   Nutrition Goals Discharge (Final Nutrition Goals Re-Evaluation):   Psychosocial: Target Goals: Acknowledge presence or absence of significant depression and/or stress, maximize coping skills, provide positive support system.  Participant is able to verbalize types and ability to use techniques and skills needed for reducing stress and depression.  Initial Review & Psychosocial Screening: Initial Psych Review & Screening - 11/18/16 1610      Initial Review   Current issues with  None Identified      Family Dynamics   Good Support System?  Yes wife     Comments  upon brief assessment, no psychosocial needs identified, no interventions necessary. pt does exhibit health related anxiety       Barriers   Psychosocial barriers to participate in program  There are no identifiable barriers or psychosocial needs.      Screening Interventions   Interventions  Encouraged to exercise;Provide feedback about the scores to participant       Quality of Life Scores:   PHQ-9: Recent Review Flowsheet Data    Depression screen [facility] 2/9 09/30/2016 09/25/2016 09/22/2016   Decreased Interest 0 0 0   Down, Depressed, Hopeless 0 0 0   PHQ - 2 Score 0 0 0     Interpretation of Total Score  Total Score Depression Severity:  1-4 = Minimal depression, 5-9 = Mild depression, 10-14 = Moderate depression, 15-19 = Moderately severe depression, 20-27 = Severe depression   Psychosocial Evaluation and Intervention:   Psychosocial Re-Evaluation:   Psychosocial Discharge (Final Psychosocial Re-Evaluation):   Vocational Rehabilitation: Provide vocational rehab assistance to qualifying candidates.   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 Participant will state understanding/return demonstration of topics presented.  Learning Barriers/Preferences:   Education Topics: Count Your Pulse:  -Group instruction provided by verbal instruction,  demonstration, patient participation and written materials to support subject.  Instructors address importance of being able to find your pulse and how to count your pulse when at home without a heart monitor.  Patients get hands on experience counting their pulse with staff help and individually.   Heart Attack, Angina, and Risk Factor Modification:  -Group instruction provided by verbal instruction, video, and written materials to support subject.  Instructors address signs and symptoms of angina and heart attacks.    Also discuss risk factors for heart disease and how to make changes to improve heart health risk factors.   Functional Fitness:  -Group instruction provided by verbal instruction, demonstration, patient participation, and written materials to support subject.  Instructors address safety measures for doing things around the house.  Discuss how  to get up and down off the floor, how to pick things up properly, how to safely get out of a chair without assistance, and balance training.   Meditation and Mindfulness:  -Group instruction provided by verbal instruction, patient participation, and written materials to support subject.  Instructor addresses importance of mindfulness and meditation practice to help reduce stress and improve awareness.  Instructor also leads participants through a meditation exercise.    Stretching for Flexibility and Mobility:  -Group instruction provided by verbal instruction, patient participation, and written materials to support subject.  Instructors lead participants through series of stretches that are designed to increase flexibility thus improving mobility.  These stretches are additional exercise for major muscle groups that are typically performed during regular warm up and cool down.   Hands Only CPR:  -Group verbal, video, and participation provides a basic overview of AHA guidelines for community CPR. Role-play of emergencies allow participants  the opportunity to practice calling for help and chest compression technique with discussion of AED use.   Hypertension: -Group verbal and written instruction that provides a basic overview of hypertension including the most recent diagnostic guidelines, risk factor reduction with self-care instructions and medication management.    Nutrition I class: Heart Healthy Eating:  -Group instruction provided by PowerPoint slides, verbal discussion, and written materials to support subject matter. The instructor gives an explanation and review of the Therapeutic Lifestyle Changes diet recommendations, which includes a discussion on lipid goals, dietary fat, sodium, fiber, plant stanol/sterol esters, sugar, and the components of a well-balanced, healthy diet.   Nutrition II class: Lifestyle Skills:  -Group instruction provided by PowerPoint slides, verbal discussion, and written materials to support subject matter. The instructor gives an explanation and review of label reading, grocery shopping for heart health, heart healthy recipe modifications, and ways to make healthier choices when eating out.   Diabetes Question & Answer:  -Group instruction provided by PowerPoint slides, verbal discussion, and written materials to support subject matter. The instructor gives an explanation and review of diabetes co-morbidities, pre- and post-prandial blood glucose goals, pre-exercise blood glucose goals, signs, symptoms, and treatment of hypoglycemia and hyperglycemia, and foot care basics.   Diabetes Blitz:  -Group instruction provided by PowerPoint slides, verbal discussion, and written materials to support subject matter. The instructor gives an explanation and review of the physiology behind type 1 and type 2 diabetes, diabetes medications and rational behind using different medications, pre- and post-prandial blood glucose recommendations and Hemoglobin A1c goals, diabetes diet, and exercise including blood  glucose guidelines for exercising safely.    Portion Distortion:  -Group instruction provided by PowerPoint slides, verbal discussion, written materials, and food models to support subject matter. The instructor gives an explanation of serving size versus portion size, changes in portions sizes over the last 20 years, and what consists of a serving from each food group.   Stress Management:  -Group instruction provided by verbal instruction, video, and written materials to support subject matter.  Instructors review role of stress in heart disease and how to cope with stress positively.     Exercising on Your Own:  -Group instruction provided by verbal instruction, power point, and written materials to support subject.  Instructors discuss benefits of exercise, components of exercise, frequency and intensity of exercise, and end points for exercise.  Also discuss use of nitroglycerin and activating EMS.  Review options of places to exercise outside of rehab.  Review guidelines for sex with heart disease.   Cardiac  Drugs I:  -Group instruction provided by verbal instruction and written materials to support subject.  Instructor reviews cardiac drug classes: antiplatelets, anticoagulants, beta blockers, and statins.  Instructor discusses reasons, side effects, and lifestyle considerations for each drug class.   Cardiac Drugs II:  -Group instruction provided by verbal instruction and written materials to support subject.  Instructor reviews cardiac drug classes: angiotensin converting enzyme inhibitors (ACE-I), angiotensin II receptor blockers (ARBs), nitrates, and calcium channel blockers.  Instructor discusses reasons, side effects, and lifestyle considerations for each drug class.   Anatomy and Physiology of the Circulatory System:  Group verbal and written instruction and models provide basic cardiac anatomy and physiology, with the coronary electrical and arterial systems. Review of: AMI,  Angina, Valve disease, Heart Failure, Peripheral Artery Disease, Cardiac Arrhythmia, Pacemakers, and the ICD.   Other Education:  -Group or individual verbal, written, or video instructions that support the educational goals of the cardiac rehab program.   Knowledge Questionnaire Score: Knowledge Questionnaire Score - 11/18/16 1553      Knowledge Questionnaire Score   Pre Score  22/24       Core Components/Risk Factors/Patient Goals at Admission: Personal Goals and Risk Factors at Admission - 11/18/16 1441      Core Components/Risk Factors/Patient Goals on Admission    Weight Management  Yes;Weight Maintenance    Intervention  Weight Management: Develop a combined nutrition and exercise program designed to reach desired caloric intake, while maintaining appropriate intake of nutrient and fiber, sodium and fats, and appropriate energy expenditure required for the weight goal.;Weight Management: Provide education and appropriate resources to help participant work on and attain dietary goals.;Weight Management/Obesity: Establish reasonable short term and long term weight goals.    Expected Outcomes  Short Term: Continue to assess and modify interventions until short term weight is achieved;Weight Maintenance: Understanding of the daily nutrition guidelines, which includes 25-35% calories from fat, 7% or less cal from saturated fats, less than 254m cholesterol, less than 1.5gm of sodium, & 5 or more servings of fruits and vegetables daily       Core Components/Risk Factors/Patient Goals Review:    Core Components/Risk Factors/Patient Goals at Discharge (Final Review):    ITP Comments: ITP Comments    Row Name 11/18/16 1547           ITP Comments  Medical Director- Dr. TFransico Him MD.          Comments: Patient attended orientation from 1400 to 1610  to review rules and guidelines for program. Completed 6 minute walk test, Intitial ITP, and exercise prescription.  VSS.  Telemetry-sinus rhythm, wide QRS.  Asymptomatic. JAndi Hence RN, BSN Cardiac Pulmonary Rehab

## 2016-11-21 ENCOUNTER — Encounter (HOSPITAL_COMMUNITY): Payer: Self-pay

## 2016-11-21 ENCOUNTER — Encounter (HOSPITAL_COMMUNITY)
Admission: RE | Admit: 2016-11-21 | Discharge: 2016-11-21 | Disposition: A | Discharge status: Home / Self Care | Payer: 59 | Source: Ambulatory Visit | Attending: Cardiology | Admitting: Cardiology

## 2016-11-21 DIAGNOSIS — Z79899 Other long term (current) drug therapy: Secondary | ICD-10-CM | POA: Diagnosis not present

## 2016-11-21 DIAGNOSIS — Z87891 Personal history of nicotine dependence: Secondary | ICD-10-CM | POA: Diagnosis not present

## 2016-11-21 DIAGNOSIS — I420 Dilated cardiomyopathy: Secondary | ICD-10-CM | POA: Diagnosis not present

## 2016-11-21 DIAGNOSIS — Z955 Presence of coronary angioplasty implant and graft: Secondary | ICD-10-CM | POA: Diagnosis present

## 2016-11-21 DIAGNOSIS — I509 Heart failure, unspecified: Secondary | ICD-10-CM | POA: Diagnosis not present

## 2016-11-21 DIAGNOSIS — Z7982 Long term (current) use of aspirin: Secondary | ICD-10-CM | POA: Diagnosis not present

## 2016-11-21 NOTE — Progress Notes (Signed)
Daily Session Note  Patient Details  Name: Miguel Pena MRN: [id number] Date of Birth: 05-25-1965 Referring Provider:     CARDIAC REHAB PHASE II ORIENTATION from 11/18/2016 in [place]  Referring Provider  Vernell Leep, MD.      Encounter Date: 11/21/2016  Check In: Session Check In - 11/21/16 1415      Check-In   Location  [facility]    Staff Present  Dorna Bloom, MS, ACSM RCEP, Exercise Physiologist;Cristal Howatt, RN, BSN;Carlette Carlton, RN, BSN;Other    Supervising physician immediately available to respond to emergencies  [facility] immediately available    Physician(s)  Dr. Broadus John    Medication changes reported      No    Fall or balance concerns reported     No    Tobacco Cessation  No Change    Warm-up and Cool-down  Performed as group-led instruction    Resistance Training Performed  Yes    VAD Patient?  No      Pain Assessment   Currently in Pain?  No/denies       Capillary Blood Glucose: No results found for this or any previous visit (from the past 24 hour(s)).    Social History   Tobacco Use  Smoking Status Former Smoker  . Packs/day: 1.00  . Years: 12.00  . Pack years: 12.00  . Types: Cigarettes, Cigars  . Last attempt to quit: 02/16/2011  . Years since quitting: 5.7  Smokeless Tobacco Former Systems developer  . Types: Snuff  Tobacco Comment   10/28/2016 'chewed for a couple years after I quit smoking"    Goals Met:  Exercise tolerated well  Goals Unmet:  Not Applicable  Comments: Pt started cardiac rehab today.  Pt tolerated light exercise without difficulty. VSS, telemetry-sinus rhythm, asymptomatic.  Medication list reconciled. Pt denies barriers to medicaiton compliance.  PSYCHOSOCIAL ASSESSMENT:  PHQ-0.  Pt exhibits positive coping skills, hopeful outlook with supportive family. No psychosocial needs identified at this time, no psychosocial interventions necessary.    Pt enjoys deer  hunting, fishing and looking for arrowheads. Pt goals for cardiac rehab are to increase strength/stamina.    Pt oriented to exercise equipment and routine.    Understanding verbalized.   Dr. Fransico Him is Medical Director for Cardiac Rehab at [facility].

## 2016-11-24 ENCOUNTER — Encounter (HOSPITAL_COMMUNITY)
Admission: RE | Admit: 2016-11-24 | Discharge: 2016-11-24 | Disposition: A | Discharge status: Home / Self Care | Payer: 59 | Source: Ambulatory Visit | Attending: Cardiology | Admitting: Cardiology

## 2016-11-24 DIAGNOSIS — Z955 Presence of coronary angioplasty implant and graft: Secondary | ICD-10-CM

## 2016-11-24 DIAGNOSIS — I509 Heart failure, unspecified: Secondary | ICD-10-CM | POA: Diagnosis not present

## 2016-11-26 ENCOUNTER — Encounter (HOSPITAL_COMMUNITY): Payer: 59

## 2016-12-01 ENCOUNTER — Encounter (HOSPITAL_COMMUNITY): Payer: 59

## 2016-12-03 ENCOUNTER — Encounter (HOSPITAL_COMMUNITY)
Admission: RE | Admit: 2016-12-03 | Discharge: 2016-12-03 | Disposition: A | Discharge status: Home / Self Care | Payer: 59 | Source: Ambulatory Visit | Attending: Cardiology | Admitting: Cardiology

## 2016-12-03 DIAGNOSIS — I509 Heart failure, unspecified: Secondary | ICD-10-CM | POA: Diagnosis not present

## 2016-12-03 DIAGNOSIS — Z955 Presence of coronary angioplasty implant and graft: Secondary | ICD-10-CM

## 2016-12-05 ENCOUNTER — Encounter (HOSPITAL_COMMUNITY)
Admission: RE | Admit: 2016-12-05 | Discharge: 2016-12-05 | Disposition: A | Discharge status: Home / Self Care | Payer: 59 | Source: Ambulatory Visit | Attending: Cardiology | Admitting: Cardiology

## 2016-12-05 DIAGNOSIS — I509 Heart failure, unspecified: Secondary | ICD-10-CM | POA: Diagnosis not present

## 2016-12-05 DIAGNOSIS — Z955 Presence of coronary angioplasty implant and graft: Secondary | ICD-10-CM

## 2016-12-05 NOTE — Progress Notes (Signed)
Cardiac Individual Treatment Plan  Patient Details  Name: Miguel Pena MRN: [id number] Date of Birth: [birth year] Referring Provider:     CARDIAC REHAB PHASE II ORIENTATION from 11/18/2016 in [place]  Referring Provider  Vernell Leep, MD.      Initial Encounter Date:    CARDIAC REHAB PHASE II ORIENTATION from 11/18/2016 in [place]  Date  11/18/16  Referring Provider  Vernell Leep, MD.      Visit Diagnosis: Stented coronary artery  Patient's Home Medications on Admission:  Current Outpatient Medications:  .  aspirin 81 MG chewable tablet, Chew 1 tablet (81 mg total) by mouth daily., Disp: 90 tablet, Rfl: 3 .  bisoprolol (ZEBETA) 5 MG tablet, Take 1 tablet (5 mg total) by mouth daily., Disp: 30 tablet, Rfl: 1 .  clopidogrel (PLAVIX) 75 MG tablet, Take 1 tablet (75 mg total) by mouth daily., Disp: 90 tablet, Rfl: 2 .  furosemide (LASIX) 40 MG tablet, Take 1 tablet (40 mg total) by mouth as needed for edema., Disp: 30 tablet, Rfl: 0 .  HYDROcodone-acetaminophen (NORCO) 10-325 MG tablet, Take 1 tablet by mouth every 4 (four) hours as needed., Disp: , Rfl:  .  hydrOXYzine (VISTARIL) 100 MG capsule, Take 100 mg by mouth at bedtime., Disp: , Rfl:  .  nicotine polacrilex (NICORETTE) 2 MG gum, Take 2 mg by mouth as needed for smoking cessation., Disp: , Rfl:  .  omega-3 acid ethyl esters (LOVAZA) 1 G capsule, Take 1 g by mouth daily as needed (for vitamin). , Disp: , Rfl:  .  potassium chloride SA (K-DUR,KLOR-CON) 20 MEQ tablet, Take 1 tablet (20 mEq total) by mouth daily., Disp: 30 tablet, Rfl: 0 .  rosuvastatin (CRESTOR) 20 MG tablet, Take 1 tablet (20 mg total) by mouth daily at 6 PM., Disp: 30 tablet, Rfl: 3 .  sacubitril-valsartan (ENTRESTO) 49-51 MG, Take 1 tablet 2 (two) times daily by mouth., Disp: , Rfl:  .  zolpidem (AMBIEN) 5 MG tablet, Take 5 mg by mouth at bedtime., Disp: , Rfl:   Past Medical  History: Past Medical History:  Diagnosis Date  . Bursitis of right shoulder   . CHF (congestive heart failure) ([place])    [name] 10/24/2016  . Dilated cardiomyopathy ([place])    [name] 10/24/2016  . Dysrhythmia 1995   "VT" per patient- states had neg stress test and no problems since  . Heart murmur    "when I was young"  . History of kidney stones   . Hypercholesterolemia     Tobacco Use: Social History   Tobacco Use  Smoking Status Former Smoker  . Packs/day: 1.00  . Years: 12.00  . Pack years: 12.00  . Types: Cigarettes, Cigars  . Last attempt to quit: 02/16/2011  . Years since quitting: 5.8  Smokeless Tobacco Former Systems developer  . Types: Snuff  Tobacco Comment   10/28/2016 'chewed for a couple years after I quit smoking"    Labs: Recent Review Flowsheet Data    Labs for ITP Cardiac and Pulmonary Rehab Latest Ref Rng & Units 09/25/2016 09/25/2016 10/28/2016 10/28/2016   Cholestrol 100 - 199 mg/dL 154 156 - -   LDLCALC 0 - 99 mg/dL 87 89 - -   HDL >39 mg/dL 26(L) 26(L) - -   Trlycerides 0 - 149 mg/dL 203(H) 205(H) - -   Hemoglobin A1c 4.8 - 5.6 % CANCELED 5.3 - -   PHART 7.350 - 7.450 - - -  7.430   PCO2ART 32.0 - 48.0 mmHg - - - 39.1   HCO3 20.0 - 28.0 mmol/L - - 24.9 25.9   TCO2 22 - 32 mmol/L - - 26 27   O2SAT % - - 64.0 95.0      Capillary Blood Glucose: No results found for: GLUCAP   Exercise Target Goals:    Exercise Program Goal: Individual exercise prescription set with THRR, safety & activity barriers. Participant demonstrates ability to understand and report RPE using BORG scale, to self-measure pulse accurately, and to acknowledge the importance of the exercise prescription.  Exercise Prescription Goal: Starting with aerobic activity 30 plus minutes a day, 3 days per week for initial exercise prescription. Provide home exercise prescription and guidelines that participant acknowledges understanding prior to discharge.  Activity Barriers & Risk  Stratification: Activity Barriers & Cardiac Risk Stratification - 11/18/16 1548      Activity Barriers & Cardiac Risk Stratification   Activity Barriers  Other (comment)    Comments  Shoulder/ mid back pain.    Cardiac Risk Stratification  High       6 Minute Walk: 6 Minute Walk    Row Name 11/18/16 1547         6 Minute Walk   Phase  Initial     Distance  1488 feet     Walk Time  6 minutes     # of Rest Breaks  0     MPH  2.82     METS  4.47     RPE  11     VO2 Peak  15.65     Symptoms  No     Resting HR  95 bpm     Resting BP  122/60     Resting Oxygen Saturation   97 %     Exercise Oxygen Saturation  during 6 min walk  96 %     Max Ex. HR  119 bpm     Max Ex. BP  132/70     2 Minute Post BP  120/63        Oxygen Initial Assessment:   Oxygen Re-Evaluation:   Oxygen Discharge (Final Oxygen Re-Evaluation):   Initial Exercise Prescription: Initial Exercise Prescription - 11/18/16 1500      Date of Initial Exercise RX and Referring Provider   Date  11/18/16    Referring Provider  Vernell Leep, MD.      Treadmill   MPH  2.6    Grade  1    Minutes  10    METs  3.35      Bike   Level  1.6    Minutes  10    METs  4.55      NuStep   Level  4    SPM  85    Minutes  10    METs  3      Prescription Details   Frequency (times per week)  3    Duration  Progress to 30 minutes of continuous aerobic without signs/symptoms of physical distress      Intensity   THRR 40-80% of Max Heartrate  67-134    Ratings of Perceived Exertion  11-13    Perceived Dyspnea  0-4      Progression   Progression  Continue to progress workloads to maintain intensity without signs/symptoms of physical distress.      Resistance Training   Training Prescription  Yes    Weight  4lbs  Reps  10-15       Perform Capillary Blood Glucose checks as needed.  Exercise Prescription Changes: Exercise Prescription Changes    Row Name 11/21/16 1603 12/03/16 1559            Response to Exercise   Blood Pressure (Admit)  112/80  120/70      Blood Pressure (Exercise)  132/70  118/72      Blood Pressure (Exit)  114/70  100/80      Heart Rate (Admit)  76 bpm  84 bpm      Heart Rate (Exercise)  111 bpm  116 bpm      Heart Rate (Exit)  66 bpm  80 bpm      Rating of Perceived Exertion (Exercise)  12  12      Symptoms  pt oriented to exercise equipment  none      Duration  Continue with 30 min of aerobic exercise without signs/symptoms of physical distress.  Continue with 30 min of aerobic exercise without signs/symptoms of physical distress.      Intensity  THRR unchanged  THRR unchanged        Progression   Progression  Continue to progress workloads to maintain intensity without signs/symptoms of physical distress.  Continue to progress workloads to maintain intensity without signs/symptoms of physical distress.      Average METs  2.9  4        Resistance Training   Training Prescription  Yes  Yes      Weight  4lbs  4lbs      Reps  10-15  10-15      Time  10 Minutes  10 Minutes        Treadmill   MPH  2.6  2.7      Grade  1  2      Minutes  15  15      METs  3.35  3.81        NuStep   Level  4  4      SPM  85  85      Minutes  15  15      METs  2.4  4.1         Exercise Comments: Exercise Comments    Row Name 11/21/16 1602           Exercise Comments  Pt was oriented to exercise equipment on 11/21/16. Pt did well with first exercise session.          Exercise Goals and Review: Exercise Goals    Row Name 11/18/16 1551             Exercise Goals   Increase Physical Activity  Yes       Intervention  Provide advice, education, support and counseling about physical activity/exercise needs.;Develop an individualized exercise prescription for aerobic and resistive training based on initial evaluation findings, risk stratification, comorbidities and participant's personal goals.       Expected Outcomes  Achievement of increased  cardiorespiratory fitness and enhanced flexibility, muscular endurance and strength shown through measurements of functional capacity and personal statement of participant.       Increase Strength and Stamina  Yes       Intervention  Provide advice, education, support and counseling about physical activity/exercise needs.;Develop an individualized exercise prescription for aerobic and resistive training based on initial evaluation findings, risk stratification, comorbidities and participant's personal goals.       Expected  Outcomes  Achievement of increased cardiorespiratory fitness and enhanced flexibility, muscular endurance and strength shown through measurements of functional capacity and personal statement of participant.       Able to understand and use rate of perceived exertion (RPE) scale  Yes       Intervention  Provide education and explanation on how to use RPE scale       Expected Outcomes  Short Term: Able to use RPE daily in rehab to express subjective intensity level;Long Term:  Able to use RPE to guide intensity level when exercising independently       Knowledge and understanding of Target Heart Rate Range (THRR)  Yes       Intervention  Provide education and explanation of THRR including how the numbers were predicted and where they are located for reference       Expected Outcomes  Short Term: Able to state/look up THRR;Long Term: Able to use THRR to govern intensity when exercising independently;Short Term: Able to use daily as guideline for intensity in rehab       Able to check pulse independently  Yes       Intervention  Provide education and demonstration on how to check pulse in carotid and radial arteries.;Review the importance of being able to check your own pulse for safety during independent exercise       Expected Outcomes  Long Term: Able to check pulse independently and accurately;Short Term: Able to explain why pulse checking is important during independent exercise        Understanding of Exercise Prescription  Yes       Intervention  Provide education, explanation, and written materials on patient's individual exercise prescription       Expected Outcomes  Short Term: Able to explain program exercise prescription;Long Term: Able to explain home exercise prescription to exercise independently          Exercise Goals Re-Evaluation : Exercise Goals Re-Evaluation    Row Name 12/04/16 1606             Exercise Goal Re-Evaluation   Exercise Goals Review  Increase Physical Activity;Understanding of Exercise Prescription;Increase Strength and Stamina;Knowledge and understanding of Target Heart Rate Range (THRR);Able to understand and use rate of perceived exertion (RPE) scale;Able to check pulse independently       Comments  Pt tolerates WL increases very well; pt demonstrates proper use of RPE scale.       Expected Outcomes  Pt will exercise for 30 minutes without difficulty to improve cardiorespiratory fitness.            Discharge Exercise Prescription (Final Exercise Prescription Changes): Exercise Prescription Changes - 12/03/16 1559      Response to Exercise   Blood Pressure (Admit)  120/70    Blood Pressure (Exercise)  118/72    Blood Pressure (Exit)  100/80    Heart Rate (Admit)  84 bpm    Heart Rate (Exercise)  116 bpm    Heart Rate (Exit)  80 bpm    Rating of Perceived Exertion (Exercise)  12    Symptoms  none    Duration  Continue with 30 min of aerobic exercise without signs/symptoms of physical distress.    Intensity  THRR unchanged      Progression   Progression  Continue to progress workloads to maintain intensity without signs/symptoms of physical distress.    Average METs  4      Resistance Training   Training Prescription  Yes  Weight  4lbs    Reps  10-15    Time  10 Minutes      Treadmill   MPH  2.7    Grade  2    Minutes  15    METs  3.81      NuStep   Level  4    SPM  85    Minutes  15    METs  4.1        Nutrition:  Target Goals: Understanding of nutrition guidelines, daily intake of sodium <1571m, cholesterol <2029m calories 30% from fat and 7% or less from saturated fats, daily to have 5 or more servings of fruits and vegetables.  Biometrics: Pre Biometrics - 11/18/16 1552      Pre Biometrics   Height  5' 10"  (1.778 m)    Weight  189 lb 13.1 oz (86.1 kg)    Waist Circumference  39 inches    Hip Circumference  39.75 inches    Waist to Hip Ratio  0.98 %    BMI (Calculated)  27.24    Triceps Skinfold  14 mm    % Body Fat  25.9 %    Grip Strength  40.5 kg    Flexibility  11 in    Single Leg Stand  5.5 seconds        Nutrition Therapy Plan and Nutrition Goals:   Nutrition Discharge: Nutrition Scores:   Nutrition Goals Re-Evaluation:   Nutrition Goals Re-Evaluation:   Nutrition Goals Discharge (Final Nutrition Goals Re-Evaluation):   Psychosocial: Target Goals: Acknowledge presence or absence of significant depression and/or stress, maximize coping skills, provide positive support system. Participant is able to verbalize types and ability to use techniques and skills needed for reducing stress and depression.  Initial Review & Psychosocial Screening: Initial Psych Review & Screening - 11/18/16 1610      Initial Review   Current issues with  None Identified      Family Dynamics   Good Support System?  Yes wife     Comments  upon brief assessment, no psychosocial needs identified, no interventions necessary. pt does exhibit health related anxiety       Barriers   Psychosocial barriers to participate in program  There are no identifiable barriers or psychosocial needs.      Screening Interventions   Interventions  Encouraged to exercise;Provide feedback about the scores to participant       Quality of Life Scores: Quality of Life - 11/18/16 1633      Quality of Life Scores   Health/Function Pre  26.8 %    Socioeconomic Pre  27.43 %    Psych/Spiritual  Pre  30 %    Family Pre  28.8 %    GLOBAL Pre  27.88 %       PHQ-9: Recent Review Flowsheet Data    [facility] 11/21/2016 09/30/2016 09/25/2016 09/22/2016   Decreased Interest 0 0 0 0   Down, Depressed, Hopeless 0 0 0 0   PHQ - 2 Score 0 0 0 0     Interpretation of Total Score  Total Score Depression Severity:  1-4 = Minimal depression, 5-9 = Mild depression, 10-14 = Moderate depression, 15-19 = Moderately severe depression, 20-27 = Severe depression   Psychosocial Evaluation and Intervention: Psychosocial Evaluation - 11/21/16 1444      Psychosocial Evaluation & Interventions   Interventions  Encouraged to exercise with the program and follow exercise prescription    Comments  no psyschosocial needs identified, no interventions necessary     Expected Outcomes  pt will exhibit positive outlook with good coping skills.     Continue Psychosocial Services   No Follow up required       Psychosocial Re-Evaluation: Psychosocial Re-Evaluation    [place] Name 12/05/16 1710             Psychosocial Re-Evaluation   Current issues with  None Identified       Comments  no psychosocial needs identified, no interventions necessary.        Expected Outcomes  pt will exhibit positive outlook with good coping skills.        Interventions  Encouraged to attend Cardiac Rehabilitation for the exercise;Stress management education;Relaxation education       Continue Psychosocial Services   No Follow up required          Psychosocial Discharge (Final Psychosocial Re-Evaluation): Psychosocial Re-Evaluation - 12/05/16 1710      Psychosocial Re-Evaluation   Current issues with  None Identified    Comments  no psychosocial needs identified, no interventions necessary.     Expected Outcomes  pt will exhibit positive outlook with good coping skills.     Interventions  Encouraged to attend Cardiac Rehabilitation for the exercise;Stress management education;Relaxation education     Continue Psychosocial Services   No Follow up required       Vocational Rehabilitation: Provide vocational rehab assistance to qualifying candidates.   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 Participant will state understanding/return demonstration of topics presented.  Learning Barriers/Preferences:   Education Topics: Count Your Pulse:  -Group instruction provided by verbal instruction, demonstration, patient participation and written materials to support subject.  Instructors address importance of being able to find your pulse and how to count your pulse when at home without a heart monitor.  Patients get hands on experience counting their pulse with staff help and individually.   Heart Attack, Angina, and Risk Factor Modification:  -Group instruction provided by verbal instruction, video, and written materials to support subject.  Instructors address signs and symptoms of angina and heart attacks.    Also discuss risk factors for heart disease and how to make changes to improve heart health risk factors.   Functional Fitness:  -Group instruction provided by verbal instruction, demonstration, patient participation, and written materials to support subject.  Instructors address safety measures for doing things around the house.  Discuss how to get up and down off the floor, how to pick things up properly, how to safely get out of a chair without assistance, and balance training.   Meditation and Mindfulness:  -Group instruction provided by verbal instruction, patient participation, and written materials to support subject.  Instructor addresses importance of mindfulness and meditation practice  to help reduce stress and improve awareness.  Instructor also leads participants through a meditation exercise.    Stretching for Flexibility and Mobility:  -Group instruction provided by verbal instruction, patient participation, and written materials to support subject.  Instructors lead participants through series of stretches that are designed to increase flexibility thus improving mobility.  These stretches are additional exercise for major muscle groups that are typically performed during regular warm up  and cool down.   Hands Only CPR:  -Group verbal, video, and participation provides a basic overview of AHA guidelines for community CPR. Role-play of emergencies allow participants the opportunity to practice calling for help and chest compression technique with discussion of AED use.   Hypertension: -Group verbal and written instruction that provides a basic overview of hypertension including the most recent diagnostic guidelines, risk factor reduction with self-care instructions and medication management.    Nutrition I class: Heart Healthy Eating:  -Group instruction provided by PowerPoint slides, verbal discussion, and written materials to support subject matter. The instructor gives an explanation and review of the Therapeutic Lifestyle Changes diet recommendations, which includes a discussion on lipid goals, dietary fat, sodium, fiber, plant stanol/sterol esters, sugar, and the components of a well-balanced, healthy diet.   Nutrition II class: Lifestyle Skills:  -Group instruction provided by PowerPoint slides, verbal discussion, and written materials to support subject matter. The instructor gives an explanation and review of label reading, grocery shopping for heart health, heart healthy recipe modifications, and ways to make healthier choices when eating out.   Diabetes Question & Answer:  -Group instruction provided by PowerPoint slides, verbal discussion, and written  materials to support subject matter. The instructor gives an explanation and review of diabetes co-morbidities, pre- and post-prandial blood glucose goals, pre-exercise blood glucose goals, signs, symptoms, and treatment of hypoglycemia and hyperglycemia, and foot care basics.   Diabetes Blitz:  -Group instruction provided by PowerPoint slides, verbal discussion, and written materials to support subject matter. The instructor gives an explanation and review of the physiology behind type 1 and type 2 diabetes, diabetes medications and rational behind using different medications, pre- and post-prandial blood glucose recommendations and Hemoglobin A1c goals, diabetes diet, and exercise including blood glucose guidelines for exercising safely.    Portion Distortion:  -Group instruction provided by PowerPoint slides, verbal discussion, written materials, and food models to support subject matter. The instructor gives an explanation of serving size versus portion size, changes in portions sizes over the last 20 years, and what consists of a serving from each food group.   Stress Management:  -Group instruction provided by verbal instruction, video, and written materials to support subject matter.  Instructors review role of stress in heart disease and how to cope with stress positively.     Exercising on Your Own:  -Group instruction provided by verbal instruction, power point, and written materials to support subject.  Instructors discuss benefits of exercise, components of exercise, frequency and intensity of exercise, and end points for exercise.  Also discuss use of nitroglycerin and activating EMS.  Review options of places to exercise outside of rehab.  Review guidelines for sex with heart disease.   Cardiac Drugs I:  -Group instruction provided by verbal instruction and written materials to support subject.  Instructor reviews cardiac drug classes: antiplatelets, anticoagulants, beta blockers,  and statins.  Instructor discusses reasons, side effects, and lifestyle considerations for each drug class.   Cardiac Drugs II:  -Group instruction provided by verbal instruction and written materials to support subject.  Instructor reviews cardiac drug classes: angiotensin converting enzyme inhibitors (ACE-I), angiotensin II receptor blockers (ARBs), nitrates, and calcium channel blockers.  Instructor discusses reasons, side effects, and lifestyle considerations for each drug class.   Anatomy and Physiology of the Circulatory System:  Group verbal and written instruction and models provide basic cardiac anatomy and physiology, with the coronary electrical and arterial systems. Review of: AMI, Angina, Valve disease, Heart Failure,  Peripheral Artery Disease, Cardiac Arrhythmia, Pacemakers, and the ICD.   CARDIAC REHAB PHASE II EXERCISE from 12/03/2016 in [place]  Date  12/03/16  Instruction Review Code  2- meets goals/outcomes      Other Education:  -Group or individual verbal, written, or video instructions that support the educational goals of the cardiac rehab program.   Knowledge Questionnaire Score: Knowledge Questionnaire Score - 11/18/16 1553      Knowledge Questionnaire Score   Pre Score  22/24       Core Components/Risk Factors/Patient Goals at Admission: Personal Goals and Risk Factors at Admission - 11/18/16 1441      Core Components/Risk Factors/Patient Goals on Admission    Weight Management  Yes;Weight Maintenance    Intervention  Weight Management: Develop a combined nutrition and exercise program designed to reach desired caloric intake, while maintaining appropriate intake of nutrient and fiber, sodium and fats, and appropriate energy expenditure required for the weight goal.;Weight Management: Provide education and appropriate resources to help participant work on and attain dietary goals.;Weight Management/Obesity: Establish  reasonable short term and long term weight goals.    Expected Outcomes  Short Term: Continue to assess and modify interventions until short term weight is achieved;Weight Maintenance: Understanding of the daily nutrition guidelines, which includes 25-35% calories from fat, 7% or less cal from saturated fats, less than 231m cholesterol, less than 1.5gm of sodium, & 5 or more servings of fruits and vegetables daily       Core Components/Risk Factors/Patient Goals Review:  Goals and Risk Factor Review    Row Name 12/05/16 1708             Core Components/Risk Factors/Patient Goals Review   Personal Goals Review  Other;Weight Management/Obesity       Review  pt with few CAD RF demonstrates eagerness to participate in CR opportunities.        Expected Outcomes  pt will participate in CR exercise, nutrition and lifestyle modification to prevent disease progression.           Core Components/Risk Factors/Patient Goals at Discharge (Final Review):  Goals and Risk Factor Review - 12/05/16 1708      Core Components/Risk Factors/Patient Goals Review   Personal Goals Review  Other;Weight Management/Obesity    Review  pt with few CAD RF demonstrates eagerness to participate in CR opportunities.     Expected Outcomes  pt will participate in CR exercise, nutrition and lifestyle modification to prevent disease progression.        ITP Comments: ITP Comments    Row Name 11/18/16 1547 12/05/16 1708         ITP Comments  Medical Director- Dr. TFransico Him MD.  30 day ITP review.  pt with good attendance and participation.          Comments:

## 2016-12-05 NOTE — Progress Notes (Signed)
   I have reviewed a Home Exercise Prescription with Marnee Guarneri . Miguel Pena is currently exercising at home.  The patient was advised to walk 3 additional days a week for 30-45 minutes.  Yasmin and I discussed how to progress their exercise prescription.  The patient stated that one of his main goals was to get back to lifting weights at his home gym.  The patient stated that they understand the exercise prescription.  We reviewed exercise guidelines, target heart rate during exercise,weather,endpoints for exercise, and goals.  Patient is encouraged to come to me with any questions. I will continue to follow up with the patient to assist them with progression and safety.     Carma Lair MS, ACSM CEP

## 2016-12-08 ENCOUNTER — Encounter (HOSPITAL_COMMUNITY): Admission: RE | Admit: 2016-12-08 | Payer: 59 | Source: Ambulatory Visit

## 2016-12-10 ENCOUNTER — Encounter (HOSPITAL_COMMUNITY)
Admission: RE | Admit: 2016-12-10 | Discharge: 2016-12-10 | Disposition: A | Discharge status: Home / Self Care | Payer: 59 | Source: Ambulatory Visit | Attending: Cardiology | Admitting: Cardiology

## 2016-12-10 DIAGNOSIS — Z955 Presence of coronary angioplasty implant and graft: Secondary | ICD-10-CM | POA: Diagnosis present

## 2016-12-10 DIAGNOSIS — I420 Dilated cardiomyopathy: Secondary | ICD-10-CM | POA: Insufficient documentation

## 2016-12-10 DIAGNOSIS — I509 Heart failure, unspecified: Secondary | ICD-10-CM | POA: Diagnosis not present

## 2016-12-10 DIAGNOSIS — Z79899 Other long term (current) drug therapy: Secondary | ICD-10-CM | POA: Diagnosis not present

## 2016-12-10 DIAGNOSIS — Z7982 Long term (current) use of aspirin: Secondary | ICD-10-CM | POA: Insufficient documentation

## 2016-12-10 DIAGNOSIS — Z87891 Personal history of nicotine dependence: Secondary | ICD-10-CM | POA: Diagnosis not present

## 2016-12-12 ENCOUNTER — Encounter (HOSPITAL_COMMUNITY)
Admission: RE | Admit: 2016-12-12 | Discharge: 2016-12-12 | Disposition: A | Discharge status: Home / Self Care | Payer: 59 | Source: Ambulatory Visit | Attending: Cardiology | Admitting: Cardiology

## 2016-12-12 DIAGNOSIS — Z955 Presence of coronary angioplasty implant and graft: Secondary | ICD-10-CM

## 2016-12-12 DIAGNOSIS — I509 Heart failure, unspecified: Secondary | ICD-10-CM | POA: Diagnosis not present

## 2016-12-15 ENCOUNTER — Encounter (HOSPITAL_COMMUNITY): Payer: 59

## 2016-12-17 ENCOUNTER — Encounter (HOSPITAL_COMMUNITY)
Admission: RE | Admit: 2016-12-17 | Discharge: 2016-12-17 | Disposition: A | Discharge status: Home / Self Care | Payer: 59 | Source: Ambulatory Visit | Attending: Cardiology | Admitting: Cardiology

## 2016-12-17 DIAGNOSIS — I509 Heart failure, unspecified: Secondary | ICD-10-CM | POA: Diagnosis not present

## 2016-12-17 DIAGNOSIS — Z955 Presence of coronary angioplasty implant and graft: Secondary | ICD-10-CM

## 2016-12-19 ENCOUNTER — Encounter (HOSPITAL_COMMUNITY): Admission: RE | Admit: 2016-12-19 | Payer: 59 | Source: Ambulatory Visit

## 2016-12-22 ENCOUNTER — Encounter (HOSPITAL_COMMUNITY)
Admission: RE | Admit: 2016-12-22 | Discharge: 2016-12-22 | Disposition: A | Discharge status: Home / Self Care | Payer: 59 | Source: Ambulatory Visit | Attending: Cardiology | Admitting: Cardiology

## 2016-12-22 DIAGNOSIS — I509 Heart failure, unspecified: Secondary | ICD-10-CM | POA: Diagnosis not present

## 2016-12-22 DIAGNOSIS — Z955 Presence of coronary angioplasty implant and graft: Secondary | ICD-10-CM

## 2016-12-22 NOTE — Progress Notes (Signed)
OUTPATIENT CARDIAC REHAB  PMH:  10/28/2016 DES OM1  Primary Cardiologist:  Dr. Virgina Jock  Pt hypotensive post exercise at cardiac rehab.  BP:  84/54.  Pt given gatorade and peanut butter crackers. Pt did not eat meal prior to coming to exercise.  Pt asymptomatic.  Recheck BP:  84/54.  Dr. Virgina Jock made aware.  Per Dr. Virgina Jock,  [facility] for pt to have BP 84/54.   Notify Dr. Virgina Jock for lower BP or if pt symptomatic. Otherwise ok to exercise.  Pt should keep regularly scheduled office appt 01/13/2016.  Pt informed.  Understanding verbalized.   Final recheck BP:  102/60.  Andi Hence, RN, BSN Cardiac Pulmonary Rehab 12/22/16  4:43 PM

## 2016-12-24 ENCOUNTER — Encounter (HOSPITAL_COMMUNITY)
Admission: RE | Admit: 2016-12-24 | Discharge: 2016-12-24 | Disposition: A | Discharge status: Home / Self Care | Payer: 59 | Source: Ambulatory Visit | Attending: Cardiology | Admitting: Cardiology

## 2016-12-24 DIAGNOSIS — I509 Heart failure, unspecified: Secondary | ICD-10-CM | POA: Diagnosis not present

## 2016-12-24 DIAGNOSIS — Z955 Presence of coronary angioplasty implant and graft: Secondary | ICD-10-CM

## 2016-12-26 ENCOUNTER — Encounter (HOSPITAL_COMMUNITY)
Admission: RE | Admit: 2016-12-26 | Discharge: 2016-12-26 | Disposition: A | Discharge status: Home / Self Care | Payer: 59 | Source: Ambulatory Visit | Attending: Cardiology | Admitting: Cardiology

## 2016-12-26 DIAGNOSIS — Z955 Presence of coronary angioplasty implant and graft: Secondary | ICD-10-CM

## 2016-12-31 ENCOUNTER — Encounter (HOSPITAL_COMMUNITY): Payer: 59

## 2017-01-02 ENCOUNTER — Encounter (HOSPITAL_COMMUNITY)
Admission: RE | Admit: 2017-01-02 | Discharge: 2017-01-02 | Disposition: A | Discharge status: Home / Self Care | Payer: 59 | Source: Ambulatory Visit | Attending: Cardiology | Admitting: Cardiology

## 2017-01-02 DIAGNOSIS — Z955 Presence of coronary angioplasty implant and graft: Secondary | ICD-10-CM

## 2017-01-02 DIAGNOSIS — I509 Heart failure, unspecified: Secondary | ICD-10-CM | POA: Diagnosis not present

## 2017-01-02 NOTE — Progress Notes (Signed)
Miguel Pena 51 y.o. male DOB: 07/14/1965 MRN: [id number]      Nutrition Note  1. Stented coronary artery    Past Medical History:  Diagnosis Date  . Bursitis of right shoulder   . CHF (congestive heart failure) ([place])    [name] 10/24/2016  . Dilated cardiomyopathy ([place])    [name] 10/24/2016  . Dysrhythmia 1995   "VT" per patient- states had neg stress test and no problems since  . Heart murmur    "when I was young"  . History of kidney stones   . Hypercholesterolemia    Meds reviewed.   HT: Ht Readings from Last 1 Encounters:  11/18/16 5' 10"  (1.778 m)    WT: Wt Readings from Last 3 Encounters:  11/18/16 189 lb 13.1 oz (86.1 kg)  10/29/16 180 lb (81.6 kg)  09/30/16 183 lb 3.2 oz (83.1 kg)     BMI 27.2  Current tobacco use? No  Labs:  Lipid Panel     Component Value Date/Time   CHOL 156 09/25/2016 1408   TRIG 205 (H) 09/25/2016 1408   HDL 26 (L) 09/25/2016 1408   CHOLHDL 6.0 (H) 09/25/2016 1408   LDLCALC 89 09/25/2016 1408    Lab Results  Component Value Date   HGBA1C 5.3 09/25/2016   CBG (last 3)  No results for input(s): GLUCAP in the last 72 hours.  Nutrition Note Spoke with pt. Nutrition plan and survey reviewed with pt. Pt is not following a heart healthy diet at this time. Pt reports he is working toward making healthier food choices. Pt expressed understanding of the information reviewed. Pt aware of nutrition education classes offered3.  Nutrition Diagnosis ? Food-and nutrition-related knowledge deficit related to lack of exposure to information as related to diagnosis of: ? CVD  Nutrition Intervention ? Pt's individual nutrition plan and goals reviewed with pt. ? Pt given handouts for: ? Nutrition I class ? Nutrition II class   Nutrition Goal(s):  ? Pt to identify and limit food sources of saturated fat, trans fat, and sodium  Plan:  Pt to attend nutrition classes ? Nutrition I ? Nutrition II ? Portion Distortion - met 12/17/17 Will  provide client-centered nutrition education as part of interdisciplinary care.   Monitor and evaluate progress toward nutrition goal with team.  Derek Mound, M.Ed, RD, LDN, CDE 01/02/2017 2:13 PM

## 2017-01-02 NOTE — Progress Notes (Signed)
Cardiac Individual Treatment Plan  Patient Details  Name: Miguel Pena MRN: [id number] Date of Birth: 1965-02-19 Referring Provider:     CARDIAC REHAB PHASE II ORIENTATION from 11/18/2016 in [place]  Referring Provider  Vernell Leep, MD.      Initial Encounter Date:    CARDIAC REHAB PHASE II ORIENTATION from 11/18/2016 in [place]  Date  11/18/16  Referring Provider  Vernell Leep, MD.      Visit Diagnosis: Stented coronary artery  Patient's Home Medications on Admission:  Current Outpatient Medications:  .  aspirin 81 MG chewable tablet, Chew 1 tablet (81 mg total) by mouth daily., Disp: 90 tablet, Rfl: 3 .  bisoprolol (ZEBETA) 5 MG tablet, Take 1 tablet (5 mg total) by mouth daily., Disp: 30 tablet, Rfl: 1 .  clopidogrel (PLAVIX) 75 MG tablet, Take 1 tablet (75 mg total) by mouth daily., Disp: 90 tablet, Rfl: 2 .  furosemide (LASIX) 40 MG tablet, Take 1 tablet (40 mg total) by mouth as needed for edema., Disp: 30 tablet, Rfl: 0 .  HYDROcodone-acetaminophen (NORCO) 10-325 MG tablet, Take 1 tablet by mouth every 4 (four) hours as needed., Disp: , Rfl:  .  hydrOXYzine (VISTARIL) 100 MG capsule, Take 100 mg by mouth at bedtime., Disp: , Rfl:  .  nicotine polacrilex (NICORETTE) 2 MG gum, Take 2 mg by mouth as needed for smoking cessation., Disp: , Rfl:  .  omega-3 acid ethyl esters (LOVAZA) 1 G capsule, Take 1 g by mouth daily as needed (for vitamin). , Disp: , Rfl:  .  potassium chloride SA (K-DUR,KLOR-CON) 20 MEQ tablet, Take 1 tablet (20 mEq total) by mouth daily., Disp: 30 tablet, Rfl: 0 .  rosuvastatin (CRESTOR) 20 MG tablet, Take 1 tablet (20 mg total) by mouth daily at 6 PM., Disp: 30 tablet, Rfl: 3 .  sacubitril-valsartan (ENTRESTO) 49-51 MG, Take 1 tablet 2 (two) times daily by mouth., Disp: , Rfl:  .  zolpidem (AMBIEN) 5 MG tablet, Take 5 mg by mouth at bedtime., Disp: , Rfl:   Past Medical  History: Past Medical History:  Diagnosis Date  . Bursitis of right shoulder   . CHF (congestive heart failure) ([place])    [name] 10/24/2016  . Dilated cardiomyopathy ([place])    [name] 10/24/2016  . Dysrhythmia 1995   "VT" per patient- states had neg stress test and no problems since  . Heart murmur    "when I was young"  . History of kidney stones   . Hypercholesterolemia     Tobacco Use: Social History   Tobacco Use  Smoking Status Former Smoker  . Packs/day: 1.00  . Years: 12.00  . Pack years: 12.00  . Types: Cigarettes, Cigars  . Last attempt to quit: 02/16/2011  . Years since quitting: 5.8  Smokeless Tobacco Former Systems developer  . Types: Snuff  Tobacco Comment   10/28/2016 'chewed for a couple years after I quit smoking"    Labs: Recent Review Flowsheet Data    Labs for ITP Cardiac and Pulmonary Rehab Latest Ref Rng & Units 09/25/2016 09/25/2016 10/28/2016 10/28/2016   Cholestrol 100 - 199 mg/dL 154 156 - -   LDLCALC 0 - 99 mg/dL 87 89 - -   HDL >39 mg/dL 26(L) 26(L) - -   Trlycerides 0 - 149 mg/dL 203(H) 205(H) - -   Hemoglobin A1c 4.8 - 5.6 % CANCELED 5.3 - -   PHART 7.350 - 7.450 - - -  7.430   PCO2ART 32.0 - 48.0 mmHg - - - 39.1   HCO3 20.0 - 28.0 mmol/L - - 24.9 25.9   TCO2 22 - 32 mmol/L - - 26 27   O2SAT % - - 64.0 95.0      Capillary Blood Glucose: No results found for: GLUCAP   Exercise Target Goals:    Exercise Program Goal: Individual exercise prescription set with THRR, safety & activity barriers. Participant demonstrates ability to understand and report RPE using BORG scale, to self-measure pulse accurately, and to acknowledge the importance of the exercise prescription.  Exercise Prescription Goal: Starting with aerobic activity 30 plus minutes a day, 3 days per week for initial exercise prescription. Provide home exercise prescription and guidelines that participant acknowledges understanding prior to discharge.  Activity Barriers & Risk  Stratification: Activity Barriers & Cardiac Risk Stratification - 11/18/16 1548      Activity Barriers & Cardiac Risk Stratification   Activity Barriers  Other (comment)    Comments  Shoulder/ mid back pain.    Cardiac Risk Stratification  High       6 Minute Walk: 6 Minute Walk    Row Name 11/18/16 1547         6 Minute Walk   Phase  Initial     Distance  1488 feet     Walk Time  6 minutes     # of Rest Breaks  0     MPH  2.82     METS  4.47     RPE  11     VO2 Peak  15.65     Symptoms  No     Resting HR  95 bpm     Resting BP  122/60     Resting Oxygen Saturation   97 %     Exercise Oxygen Saturation  during 6 min walk  96 %     Max Ex. HR  119 bpm     Max Ex. BP  132/70     2 Minute Post BP  120/63        Oxygen Initial Assessment:   Oxygen Re-Evaluation:   Oxygen Discharge (Final Oxygen Re-Evaluation):   Initial Exercise Prescription: Initial Exercise Prescription - 11/18/16 1500      Date of Initial Exercise RX and Referring Provider   Date  11/18/16    Referring Provider  Vernell Leep, MD.      Treadmill   MPH  2.6    Grade  1    Minutes  10    METs  3.35      Bike   Level  1.6    Minutes  10    METs  4.55      NuStep   Level  4    SPM  85    Minutes  10    METs  3      Prescription Details   Frequency (times per week)  3    Duration  Progress to 30 minutes of continuous aerobic without signs/symptoms of physical distress      Intensity   THRR 40-80% of Max Heartrate  67-134    Ratings of Perceived Exertion  11-13    Perceived Dyspnea  0-4      Progression   Progression  Continue to progress workloads to maintain intensity without signs/symptoms of physical distress.      Resistance Training   Training Prescription  Yes    Weight  4lbs  Reps  10-15       Perform Capillary Blood Glucose checks as needed.  Exercise Prescription Changes: Exercise Prescription Changes    Row Name 11/21/16 1603 12/03/16 1559 12/12/16  1700 12/24/16 1600       Response to Exercise   Blood Pressure (Admit)  112/80  120/70  98/59  106/65    Blood Pressure (Exercise)  132/70  118/72  146/76  126/74    Blood Pressure (Exit)  114/70  100/80  94/61  118/60    Heart Rate (Admit)  76 bpm  84 bpm  79 bpm  95 bpm    Heart Rate (Exercise)  111 bpm  116 bpm  118 bpm  125 bpm    Heart Rate (Exit)  66 bpm  80 bpm  76 bpm  75 bpm    Rating of Perceived Exertion (Exercise)  12  12  13  14     Symptoms  pt oriented to exercise equipment  none  none  none    Duration  Continue with 30 min of aerobic exercise without signs/symptoms of physical distress.  Continue with 30 min of aerobic exercise without signs/symptoms of physical distress.  Continue with 30 min of aerobic exercise without signs/symptoms of physical distress.  Continue with 30 min of aerobic exercise without signs/symptoms of physical distress.    Intensity  THRR unchanged  THRR unchanged  THRR unchanged  THRR unchanged      Progression   Progression  Continue to progress workloads to maintain intensity without signs/symptoms of physical distress.  Continue to progress workloads to maintain intensity without signs/symptoms of physical distress.  Continue to progress workloads to maintain intensity without signs/symptoms of physical distress.  Continue to progress workloads to maintain intensity without signs/symptoms of physical distress.    Average METs  2.9  4  4.7  4.8      Resistance Training   Training Prescription  Yes  Yes  Yes  No    Weight  4lbs  4lbs  4lbs  -    Reps  10-15  10-15  10-15  -    Time  10 Minutes  10 Minutes  10 Minutes  -      Treadmill   MPH  2.6  2.7  -  -    Grade  1  2  -  -    Minutes  15  15  -  -    METs  3.35  3.81  -  -      Bike   Level  -  -  1.6  1.8    Minutes  -  -  15  15    METs  -  -  4.53  4.95      NuStep   Level  4  4  5  5     SPM  85  85  85  85    Minutes  15  15  15  15     METs  2.4  4.1  4.9  4.8      Home  Exercise Plan   Plans to continue exercise at  -  -  Home (comment) Walking  Home (comment) Walking     Frequency  -  -  Add 3 additional days to program exercise sessions.  Add 3 additional days to program exercise sessions.    Initial Home Exercises Provided  -  -  12/05/16  12/05/16  Exercise Comments: Exercise Comments    Row Name 11/21/16 1602 12/24/16 1635         Exercise Comments  Pt was oriented to exercise equipment on 11/21/16. Pt did well with first exercise session.  Pt is responding well to exercise prescription.  Will continue to monitor and progress well.          Exercise Goals and Review: Exercise Goals    Row Name 11/18/16 1551             Exercise Goals   Increase Physical Activity  Yes       Intervention  Provide advice, education, support and counseling about physical activity/exercise needs.;Develop an individualized exercise prescription for aerobic and resistive training based on initial evaluation findings, risk stratification, comorbidities and participant's personal goals.       Expected Outcomes  Achievement of increased cardiorespiratory fitness and enhanced flexibility, muscular endurance and strength shown through measurements of functional capacity and personal statement of participant.       Increase Strength and Stamina  Yes       Intervention  Provide advice, education, support and counseling about physical activity/exercise needs.;Develop an individualized exercise prescription for aerobic and resistive training based on initial evaluation findings, risk stratification, comorbidities and participant's personal goals.       Expected Outcomes  Achievement of increased cardiorespiratory fitness and enhanced flexibility, muscular endurance and strength shown through measurements of functional capacity and personal statement of participant.       Able to understand and use rate of perceived exertion (RPE) scale  Yes       Intervention  Provide  education and explanation on how to use RPE scale       Expected Outcomes  Short Term: Able to use RPE daily in rehab to express subjective intensity level;Long Term:  Able to use RPE to guide intensity level when exercising independently       Knowledge and understanding of Target Heart Rate Range (THRR)  Yes       Intervention  Provide education and explanation of THRR including how the numbers were predicted and where they are located for reference       Expected Outcomes  Short Term: Able to state/look up THRR;Long Term: Able to use THRR to govern intensity when exercising independently;Short Term: Able to use daily as guideline for intensity in rehab       Able to check pulse independently  Yes       Intervention  Provide education and demonstration on how to check pulse in carotid and radial arteries.;Review the importance of being able to check your own pulse for safety during independent exercise       Expected Outcomes  Long Term: Able to check pulse independently and accurately;Short Term: Able to explain why pulse checking is important during independent exercise       Understanding of Exercise Prescription  Yes       Intervention  Provide education, explanation, and written materials on patient's individual exercise prescription       Expected Outcomes  Short Term: Able to explain program exercise prescription;Long Term: Able to explain home exercise prescription to exercise independently          Exercise Goals Re-Evaluation : Exercise Goals Re-Evaluation    Row Name 12/04/16 1606 12/24/16 1638           Exercise Goal Re-Evaluation   Exercise Goals Review  Increase Physical Activity;Understanding of Exercise Prescription;Increase Strength and Stamina;Knowledge and  understanding of Target Heart Rate Range (THRR);Able to understand and use rate of perceived exertion (RPE) scale;Able to check pulse independently  Increase Physical Activity      Comments  Pt tolerates WL increases  very well; pt demonstrates proper use of RPE scale.  Pt tolerates WL increases very well; pt demonstrates proper use of RPE scale.      Expected Outcomes  Pt will exercise for 30 minutes without difficulty to improve cardiorespiratory fitness.   Pt will continue to exercise 3x a week in cardiac rehab, in addition to walking at home 2-3 days for 30 minutes.           Discharge Exercise Prescription (Final Exercise Prescription Changes): Exercise Prescription Changes - 12/24/16 1600      Response to Exercise   Blood Pressure (Admit)  106/65    Blood Pressure (Exercise)  126/74    Blood Pressure (Exit)  118/60    Heart Rate (Admit)  95 bpm    Heart Rate (Exercise)  125 bpm    Heart Rate (Exit)  75 bpm    Rating of Perceived Exertion (Exercise)  14    Symptoms  none    Duration  Continue with 30 min of aerobic exercise without signs/symptoms of physical distress.    Intensity  THRR unchanged      Progression   Progression  Continue to progress workloads to maintain intensity without signs/symptoms of physical distress.    Average METs  4.8      Resistance Training   Training Prescription  No      Bike   Level  1.8    Minutes  15    METs  4.95      NuStep   Level  5    SPM  85    Minutes  15    METs  4.8      Home Exercise Plan   Plans to continue exercise at  Home (comment) Walking     Frequency  Add 3 additional days to program exercise sessions.    Initial Home Exercises Provided  12/05/16       Nutrition:  Target Goals: Understanding of nutrition guidelines, daily intake of sodium <1575m, cholesterol <2069m calories 30% from fat and 7% or less from saturated fats, daily to have 5 or more servings of fruits and vegetables.  Biometrics: Pre Biometrics - 11/18/16 1552      Pre Biometrics   Height  5' 10"  (1.778 m)    Weight  189 lb 13.1 oz (86.1 kg)    Waist Circumference  39 inches    Hip Circumference  39.75 inches    Waist to Hip Ratio  0.98 %    BMI  (Calculated)  27.24    Triceps Skinfold  14 mm    % Body Fat  25.9 %    Grip Strength  40.5 kg    Flexibility  11 in    Single Leg Stand  5.5 seconds        Nutrition Therapy Plan and Nutrition Goals:   Nutrition Discharge: Nutrition Scores:   Nutrition Goals Re-Evaluation:   Nutrition Goals Re-Evaluation:   Nutrition Goals Discharge (Final Nutrition Goals Re-Evaluation):   Psychosocial: Target Goals: Acknowledge presence or absence of significant depression and/or stress, maximize coping skills, provide positive support system. Participant is able to verbalize types and ability to use techniques and skills needed for reducing stress and depression.  Initial Review & Psychosocial Screening: Initial Psych Review &  Screening - 11/18/16 1610      Initial Review   Current issues with  None Identified      Family Dynamics   Good Support System?  Yes wife     Comments  upon brief assessment, no psychosocial needs identified, no interventions necessary. pt does exhibit health related anxiety       Barriers   Psychosocial barriers to participate in program  There are no identifiable barriers or psychosocial needs.      Screening Interventions   Interventions  Encouraged to exercise;Provide feedback about the scores to participant       Quality of Life Scores: Quality of Life - 11/18/16 1633      Quality of Life Scores   Health/Function Pre  26.8 %    Socioeconomic Pre  27.43 %    Psych/Spiritual Pre  30 %    Family Pre  28.8 %    GLOBAL Pre  27.88 %       PHQ-9: Recent Review Flowsheet Data    Depression screen [facility] 2/9 11/21/2016 09/30/2016 09/25/2016 09/22/2016   Decreased Interest 0 0 0 0   Down, Depressed, Hopeless 0 0 0 0   PHQ - 2 Score 0 0 0 0     Interpretation of Total Score  Total Score Depression Severity:  1-4 = Minimal depression, 5-9 = Mild depression, 10-14 = Moderate depression, 15-19 = Moderately severe depression, 20-27 = Severe depression    Psychosocial Evaluation and Intervention: Psychosocial Evaluation - 11/21/16 1444      Psychosocial Evaluation & Interventions   Interventions  Encouraged to exercise with the program and follow exercise prescription    Comments  no psyschosocial needs identified, no interventions necessary     Expected Outcomes  pt will exhibit positive outlook with good coping skills.     Continue Psychosocial Services   No Follow up required       Psychosocial Re-Evaluation: Psychosocial Re-Evaluation    [place] Name 12/05/16 1710 01/02/17 1233           Psychosocial Re-Evaluation   Current issues with  None Identified  None Identified      Comments  no psychosocial needs identified, no interventions necessary.   no psychosocial needs identified, no interventions necessary.       Expected Outcomes  pt will exhibit positive outlook with good coping skills.   pt will exhibit positive outlook with good coping skills.       Interventions  Encouraged to attend Cardiac Rehabilitation for the exercise;Stress management education;Relaxation education  Encouraged to attend Cardiac Rehabilitation for the exercise;Stress management education;Relaxation education      Continue Psychosocial Services   No Follow up required  No Follow up required         Psychosocial Discharge (Final Psychosocial Re-Evaluation): Psychosocial Re-Evaluation - 01/02/17 1233      Psychosocial Re-Evaluation   Current issues with  None Identified    Comments  no psychosocial needs identified, no interventions necessary.     Expected Outcomes  pt will exhibit positive outlook with good coping skills.     Interventions  Encouraged to attend Cardiac Rehabilitation for the exercise;Stress management education;Relaxation education    Continue Psychosocial Services   No Follow up required       Vocational Rehabilitation: Provide vocational rehab assistance to qualifying candidates.   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 Participant will state understanding/return demonstration of topics presented.  Learning Barriers/Preferences:   Education Topics: Count Your Pulse:  -Group instruction provided by verbal instruction, demonstration, patient participation and written materials to support subject.  Instructors address importance of being able to find your pulse and how to count your pulse when at home without a heart monitor.  Patients get hands on experience counting their pulse with staff help and individually.   Heart Attack, Angina, and Risk Factor Modification:  -Group instruction provided by verbal instruction, video, and written materials to support subject.  Instructors address signs and symptoms of angina and heart attacks.    Also discuss risk factors for heart disease and how to make changes to improve heart health risk factors.   Functional Fitness:  -Group instruction provided by verbal instruction, demonstration, patient participation, and written materials to support subject.  Instructors address safety measures for doing things around the house.  Discuss how to get up and down off the floor, how to pick things up properly, how to safely get out of a chair without assistance, and balance training.   Meditation and Mindfulness:  -Group instruction provided by verbal instruction, patient participation, and written materials to support subject.  Instructor addresses importance of mindfulness and meditation practice to help reduce stress and improve awareness.  Instructor also leads participants through a meditation exercise.    Stretching for Flexibility and Mobility:  -Group instruction provided  by verbal instruction, patient participation, and written materials to support subject.  Instructors lead participants through series of stretches that are designed to increase flexibility thus improving mobility.  These stretches are additional exercise for major muscle groups that are typically performed during regular warm up and cool down.   Hands Only CPR:  -Group verbal, video, and participation provides a basic overview of AHA guidelines for community CPR. Role-play of emergencies allow participants the opportunity to practice calling for help and chest compression technique with discussion of AED use.   Hypertension: -Group verbal and written instruction that provides a basic overview of hypertension including the most recent diagnostic guidelines, risk factor reduction with self-care instructions and medication management.    Nutrition I class: Heart Healthy Eating:  -Group instruction provided by PowerPoint slides, verbal discussion, and written materials to support subject matter. The instructor gives an explanation and review of the Therapeutic Lifestyle Changes diet recommendations, which includes a discussion on lipid goals, dietary fat, sodium, fiber, plant stanol/sterol esters, sugar, and the components of a well-balanced, healthy diet.   Nutrition II class: Lifestyle Skills:  -Group instruction provided by PowerPoint slides, verbal discussion, and written materials to support subject matter. The instructor gives an explanation and review of label reading, grocery shopping for heart health, heart healthy recipe modifications, and ways to make healthier choices when eating out.   Diabetes Question & Answer:  -Group instruction provided by PowerPoint slides, verbal discussion, and written materials to support subject matter. The instructor gives an explanation and review of diabetes co-morbidities, pre- and post-prandial blood glucose goals, pre-exercise blood glucose goals, signs,  symptoms, and treatment of hypoglycemia and hyperglycemia, and foot care basics.   Diabetes Blitz:  -Group instruction provided by PowerPoint slides, verbal discussion, and written materials to support subject matter. The instructor gives an explanation and review of the physiology behind type 1 and  type 2 diabetes, diabetes medications and rational behind using different medications, pre- and post-prandial blood glucose recommendations and Hemoglobin A1c goals, diabetes diet, and exercise including blood glucose guidelines for exercising safely.    Portion Distortion:  -Group instruction provided by PowerPoint slides, verbal discussion, written materials, and food models to support subject matter. The instructor gives an explanation of serving size versus portion size, changes in portions sizes over the last 20 years, and what consists of a serving from each food group.   CARDIAC REHAB PHASE II EXERCISE from 12/24/2016 in [place]  Date  12/24/16  Educator  RD  Instruction Review Code  2- meets goals/outcomes      Stress Management:  -Group instruction provided by verbal instruction, video, and written materials to support subject matter.  Instructors review role of stress in heart disease and how to cope with stress positively.     Exercising on Your Own:  -Group instruction provided by verbal instruction, power point, and written materials to support subject.  Instructors discuss benefits of exercise, components of exercise, frequency and intensity of exercise, and end points for exercise.  Also discuss use of nitroglycerin and activating EMS.  Review options of places to exercise outside of rehab.  Review guidelines for sex with heart disease.   Cardiac Drugs I:  -Group instruction provided by verbal instruction and written materials to support subject.  Instructor reviews cardiac drug classes: antiplatelets, anticoagulants, beta blockers, and statins.   Instructor discusses reasons, side effects, and lifestyle considerations for each drug class.   Cardiac Drugs II:  -Group instruction provided by verbal instruction and written materials to support subject.  Instructor reviews cardiac drug classes: angiotensin converting enzyme inhibitors (ACE-I), angiotensin II receptor blockers (ARBs), nitrates, and calcium channel blockers.  Instructor discusses reasons, side effects, and lifestyle considerations for each drug class.   CARDIAC REHAB PHASE II EXERCISE from 12/24/2016 in [place]  Date  12/17/16  Instruction Review Code  2- meets goals/outcomes      Anatomy and Physiology of the Circulatory System:  Group verbal and written instruction and models provide basic cardiac anatomy and physiology, with the coronary electrical and arterial systems. Review of: AMI, Angina, Valve disease, Heart Failure, Peripheral Artery Disease, Cardiac Arrhythmia, Pacemakers, and the ICD.   CARDIAC REHAB PHASE II EXERCISE from 12/24/2016 in [place]  Date  12/03/16  Instruction Review Code  2- meets goals/outcomes      Other Education:  -Group or individual verbal, written, or video instructions that support the educational goals of the cardiac rehab program.   Knowledge Questionnaire Score: Knowledge Questionnaire Score - 11/18/16 1553      Knowledge Questionnaire Score   Pre Score  22/24       Core Components/Risk Factors/Patient Goals at Admission: Personal Goals and Risk Factors at Admission - 11/18/16 1441      Core Components/Risk Factors/Patient Goals on Admission    Weight Management  Yes;Weight Maintenance    Intervention  Weight Management: Develop a combined nutrition and exercise program designed to reach desired caloric intake, while maintaining appropriate intake of nutrient and fiber, sodium and fats, and appropriate energy expenditure required for the weight goal.;Weight  Management: Provide education and appropriate resources to help participant work on and attain dietary goals.;Weight Management/Obesity: Establish reasonable short term and long term weight goals.    Expected Outcomes  Short Term: Continue to assess and modify interventions until short  term weight is achieved;Weight Maintenance: Understanding of the daily nutrition guidelines, which includes 25-35% calories from fat, 7% or less cal from saturated fats, less than 23m cholesterol, less than 1.5gm of sodium, & 5 or more servings of fruits and vegetables daily       Core Components/Risk Factors/Patient Goals Review:  Goals and Risk Factor Review    Row Name 12/05/16 1708 01/02/17 1233           Core Components/Risk Factors/Patient Goals Review   Personal Goals Review  Other;Weight Management/Obesity  Other;Weight Management/Obesity      Review  pt with few CAD RF demonstrates eagerness to participate in CR opportunities.   pt with few CAD RF demonstrates eagerness to participate in CR opportunities.       Expected Outcomes  pt will participate in CR exercise, nutrition and lifestyle modification to prevent disease progression.   pt will participate in CR exercise, nutrition and lifestyle modification to prevent disease progression.          Core Components/Risk Factors/Patient Goals at Discharge (Final Review):  Goals and Risk Factor Review - 01/02/17 1233      Core Components/Risk Factors/Patient Goals Review   Personal Goals Review  Other;Weight Management/Obesity    Review  pt with few CAD RF demonstrates eagerness to participate in CR opportunities.     Expected Outcomes  pt will participate in CR exercise, nutrition and lifestyle modification to prevent disease progression.        ITP Comments: ITP Comments    Row Name 11/18/16 1547 12/05/16 1708 01/02/17 1233       ITP Comments  Medical Director- Dr. TFransico Him MD.  30 day ITP review.  pt with good attendance and  participation.   30 day ITP review.  pt with good attendance and participation.         Comments:

## 2017-01-05 ENCOUNTER — Encounter (HOSPITAL_COMMUNITY): Payer: 59

## 2017-01-07 ENCOUNTER — Encounter (HOSPITAL_COMMUNITY)
Admission: RE | Admit: 2017-01-07 | Discharge: 2017-01-07 | Disposition: A | Discharge status: Home / Self Care | Payer: 59 | Source: Ambulatory Visit | Attending: Cardiology | Admitting: Cardiology

## 2017-01-07 DIAGNOSIS — Z79899 Other long term (current) drug therapy: Secondary | ICD-10-CM | POA: Diagnosis not present

## 2017-01-07 DIAGNOSIS — I420 Dilated cardiomyopathy: Secondary | ICD-10-CM | POA: Diagnosis not present

## 2017-01-07 DIAGNOSIS — Z955 Presence of coronary angioplasty implant and graft: Secondary | ICD-10-CM | POA: Diagnosis present

## 2017-01-07 DIAGNOSIS — Z7982 Long term (current) use of aspirin: Secondary | ICD-10-CM | POA: Diagnosis not present

## 2017-01-07 DIAGNOSIS — I509 Heart failure, unspecified: Secondary | ICD-10-CM | POA: Insufficient documentation

## 2017-01-07 DIAGNOSIS — Z87891 Personal history of nicotine dependence: Secondary | ICD-10-CM | POA: Insufficient documentation

## 2017-01-09 ENCOUNTER — Encounter (HOSPITAL_COMMUNITY)
Admission: RE | Admit: 2017-01-09 | Discharge: 2017-01-09 | Disposition: A | Discharge status: Home / Self Care | Payer: 59 | Source: Ambulatory Visit | Attending: Cardiology | Admitting: Cardiology

## 2017-01-09 DIAGNOSIS — Z955 Presence of coronary angioplasty implant and graft: Secondary | ICD-10-CM

## 2017-01-09 DIAGNOSIS — I509 Heart failure, unspecified: Secondary | ICD-10-CM | POA: Diagnosis not present

## 2017-01-12 ENCOUNTER — Encounter (HOSPITAL_COMMUNITY): Payer: 59

## 2017-01-14 ENCOUNTER — Encounter (HOSPITAL_COMMUNITY)
Admission: RE | Admit: 2017-01-14 | Discharge: 2017-01-14 | Disposition: A | Discharge status: Home / Self Care | Payer: 59 | Source: Ambulatory Visit | Attending: Cardiology | Admitting: Cardiology

## 2017-01-14 DIAGNOSIS — Z955 Presence of coronary angioplasty implant and graft: Secondary | ICD-10-CM

## 2017-01-14 DIAGNOSIS — I509 Heart failure, unspecified: Secondary | ICD-10-CM | POA: Diagnosis not present

## 2017-01-14 NOTE — Progress Notes (Signed)
OUTPATIENT CARDIAC REHAB  PMH: CHF  Primary Cardiologist: Dr. Virgina Jock  Pt c/o early satiety and numbness  in right abdomen at times. Pt reports symptoms resolve with improved posture.   Pt reports this has been ongoing since his cardiac hospitalization.  Pt states he has discussed with PCP and Dr. Virgina Jock with no change in regimen. Pt also reports he will be scheduling colonoscopy consult with gastroenterology soon.  Bowel sounds present x4 quadrants.  Abdomen non tender.  Pt does c/o occasional constipation.  Pt instructed to discuss these concerns with PCP or gastroenterologist.   Andi Hence, RN, BSN Cardiac Pulmonary Rehab 01/14/17  4:02 PM

## 2017-01-16 ENCOUNTER — Encounter (HOSPITAL_COMMUNITY): Payer: 59

## 2017-01-19 ENCOUNTER — Encounter (HOSPITAL_COMMUNITY): Payer: 59

## 2017-01-21 ENCOUNTER — Encounter (HOSPITAL_COMMUNITY)
Admission: RE | Admit: 2017-01-21 | Discharge: 2017-01-21 | Disposition: A | Discharge status: Home / Self Care | Payer: 59 | Source: Ambulatory Visit | Attending: Cardiology | Admitting: Cardiology

## 2017-01-21 DIAGNOSIS — I509 Heart failure, unspecified: Secondary | ICD-10-CM | POA: Diagnosis not present

## 2017-01-21 DIAGNOSIS — Z955 Presence of coronary angioplasty implant and graft: Secondary | ICD-10-CM

## 2017-01-23 ENCOUNTER — Encounter (HOSPITAL_COMMUNITY)
Admission: RE | Admit: 2017-01-23 | Discharge: 2017-01-23 | Disposition: A | Discharge status: Home / Self Care | Payer: 59 | Source: Ambulatory Visit | Attending: Cardiology | Admitting: Cardiology

## 2017-01-23 DIAGNOSIS — I509 Heart failure, unspecified: Secondary | ICD-10-CM | POA: Diagnosis not present

## 2017-01-23 DIAGNOSIS — Z955 Presence of coronary angioplasty implant and graft: Secondary | ICD-10-CM

## 2017-01-26 ENCOUNTER — Encounter (HOSPITAL_COMMUNITY): Payer: 59

## 2017-01-28 ENCOUNTER — Encounter (HOSPITAL_COMMUNITY): Admission: RE | Admit: 2017-01-28 | Payer: 59 | Source: Ambulatory Visit

## 2017-01-28 NOTE — Progress Notes (Signed)
Cardiac Individual Treatment Plan  Patient Details  Name: Miguel Pena MRN: [id number] Date of Birth: 09-02-1965 Referring Provider:     CARDIAC REHAB PHASE II ORIENTATION from 11/18/2016 in [place]  Referring Provider  Vernell Leep, MD.      Initial Encounter Date:    CARDIAC REHAB PHASE II ORIENTATION from 11/18/2016 in [place]  Date  11/18/16  Referring Provider  Vernell Leep, MD.      Visit Diagnosis: Stented coronary artery  Patient's Home Medications on Admission:  Current Outpatient Medications:  .  aspirin 81 MG chewable tablet, Chew 1 tablet (81 mg total) by mouth daily., Disp: 90 tablet, Rfl: 3 .  bisoprolol (ZEBETA) 5 MG tablet, Take 1 tablet (5 mg total) by mouth daily., Disp: 30 tablet, Rfl: 1 .  clopidogrel (PLAVIX) 75 MG tablet, Take 1 tablet (75 mg total) by mouth daily., Disp: 90 tablet, Rfl: 2 .  furosemide (LASIX) 40 MG tablet, Take 1 tablet (40 mg total) by mouth as needed for edema., Disp: 30 tablet, Rfl: 0 .  HYDROcodone-acetaminophen (NORCO) 10-325 MG tablet, Take 1 tablet by mouth every 4 (four) hours as needed., Disp: , Rfl:  .  hydrOXYzine (VISTARIL) 100 MG capsule, Take 100 mg by mouth at bedtime., Disp: , Rfl:  .  nicotine polacrilex (NICORETTE) 2 MG gum, Take 2 mg by mouth as needed for smoking cessation., Disp: , Rfl:  .  omega-3 acid ethyl esters (LOVAZA) 1 G capsule, Take 1 g by mouth daily as needed (for vitamin). , Disp: , Rfl:  .  potassium chloride SA (K-DUR,KLOR-CON) 20 MEQ tablet, Take 1 tablet (20 mEq total) by mouth daily., Disp: 30 tablet, Rfl: 0 .  rosuvastatin (CRESTOR) 20 MG tablet, Take 1 tablet (20 mg total) by mouth daily at 6 PM., Disp: 30 tablet, Rfl: 3 .  sacubitril-valsartan (ENTRESTO) 49-51 MG, Take 1 tablet 2 (two) times daily by mouth., Disp: , Rfl:  .  zolpidem (AMBIEN) 5 MG tablet, Take 5 mg by mouth at bedtime., Disp: , Rfl:   Past Medical  History: Past Medical History:  Diagnosis Date  . Bursitis of right shoulder   . CHF (congestive heart failure) ([place])    [name] 10/24/2016  . Dilated cardiomyopathy ([place])    [name] 10/24/2016  . Dysrhythmia 1995   "VT" per patient- states had neg stress test and no problems since  . Heart murmur    "when I was young"  . History of kidney stones   . Hypercholesterolemia     Tobacco Use: Social History   Tobacco Use  Smoking Status Former Smoker  . Packs/day: 1.00  . Years: 12.00  . Pack years: 12.00  . Types: Cigarettes, Cigars  . Last attempt to quit: 02/16/2011  . Years since quitting: 5.9  Smokeless Tobacco Former Systems developer  . Types: Snuff  Tobacco Comment   10/28/2016 'chewed for a couple years after I quit smoking"    Labs: Recent Review Flowsheet Data    Labs for ITP Cardiac and Pulmonary Rehab Latest Ref Rng & Units 09/25/2016 09/25/2016 10/28/2016 10/28/2016   Cholestrol 100 - 199 mg/dL 154 156 - -   LDLCALC 0 - 99 mg/dL 87 89 - -   HDL >39 mg/dL 26(L) 26(L) - -   Trlycerides 0 - 149 mg/dL 203(H) 205(H) - -   Hemoglobin A1c 4.8 - 5.6 % CANCELED 5.3 - -   PHART 7.350 - 7.450 - - -  7.430   PCO2ART 32.0 - 48.0 mmHg - - - 39.1   HCO3 20.0 - 28.0 mmol/L - - 24.9 25.9   TCO2 22 - 32 mmol/L - - 26 27   O2SAT % - - 64.0 95.0      Capillary Blood Glucose: No results found for: GLUCAP   Exercise Target Goals:    Exercise Program Goal: Individual exercise prescription set using results from initial 6 min walk test and THRR while considering  patient's activity barriers and safety.   Exercise Prescription Goal: Initial exercise prescription builds to 30-45 minutes a day of aerobic activity, 2-3 days per week.  Home exercise guidelines will be given to patient during program as part of exercise prescription that the participant will acknowledge.  Activity Barriers & Risk Stratification: Activity Barriers & Cardiac Risk Stratification - 11/18/16 1548      Activity  Barriers & Cardiac Risk Stratification   Activity Barriers  Other (comment)    Comments  Shoulder/ mid back pain.    Cardiac Risk Stratification  High       6 Minute Walk: 6 Minute Walk    Row Name 11/18/16 1547         6 Minute Walk   Phase  Initial     Distance  1488 feet     Walk Time  6 minutes     # of Rest Breaks  0     MPH  2.82     METS  4.47     RPE  11     VO2 Peak  15.65     Symptoms  No     Resting HR  95 bpm     Resting BP  122/60     Resting Oxygen Saturation   97 %     Exercise Oxygen Saturation  during 6 min walk  96 %     Max Ex. HR  119 bpm     Max Ex. BP  132/70     2 Minute Post BP  120/63        Oxygen Initial Assessment:   Oxygen Re-Evaluation:   Oxygen Discharge (Final Oxygen Re-Evaluation):   Initial Exercise Prescription: Initial Exercise Prescription - 11/18/16 1500      Date of Initial Exercise RX and Referring Provider   Date  11/18/16    Referring Provider  Vernell Leep, MD.      Treadmill   MPH  2.6    Grade  1    Minutes  10    METs  3.35      Bike   Level  1.6    Minutes  10    METs  4.55      NuStep   Level  4    SPM  85    Minutes  10    METs  3      Prescription Details   Frequency (times per week)  3    Duration  Progress to 30 minutes of continuous aerobic without signs/symptoms of physical distress      Intensity   THRR 40-80% of Max Heartrate  67-134    Ratings of Perceived Exertion  11-13    Perceived Dyspnea  0-4      Progression   Progression  Continue to progress workloads to maintain intensity without signs/symptoms of physical distress.      Resistance Training   Training Prescription  Yes    Weight  4lbs    Reps  10-15       Perform Capillary Blood Glucose checks as needed.  Exercise Prescription Changes: Exercise Prescription Changes    Row Name 11/21/16 1603 12/03/16 1559 12/12/16 1700 12/24/16 1600 01/09/17 1254     Response to Exercise   Blood Pressure (Admit)  112/80   120/70  98/59  106/65  118/70   Blood Pressure (Exercise)  132/70  118/72  146/76  126/74  126/60   Blood Pressure (Exit)  114/70  100/80  94/61  118/60  106/64   Heart Rate (Admit)  76 bpm  84 bpm  79 bpm  95 bpm  75 bpm   Heart Rate (Exercise)  111 bpm  116 bpm  118 bpm  125 bpm  116 bpm   Heart Rate (Exit)  66 bpm  80 bpm  76 bpm  75 bpm  80 bpm   Rating of Perceived Exertion (Exercise)  12  12  13  14  14    Symptoms  pt oriented to exercise equipment  none  none  none  none   Duration  Continue with 30 min of aerobic exercise without signs/symptoms of physical distress.  Continue with 30 min of aerobic exercise without signs/symptoms of physical distress.  Continue with 30 min of aerobic exercise without signs/symptoms of physical distress.  Continue with 30 min of aerobic exercise without signs/symptoms of physical distress.  Continue with 30 min of aerobic exercise without signs/symptoms of physical distress.   Intensity  THRR unchanged  THRR unchanged  THRR unchanged  THRR unchanged  THRR unchanged     Progression   Progression  Continue to progress workloads to maintain intensity without signs/symptoms of physical distress.  Continue to progress workloads to maintain intensity without signs/symptoms of physical distress.  Continue to progress workloads to maintain intensity without signs/symptoms of physical distress.  Continue to progress workloads to maintain intensity without signs/symptoms of physical distress.  Continue to progress workloads to maintain intensity without signs/symptoms of physical distress.   Average METs  2.9  4  4.7  4.8  4.5     Resistance Training   Training Prescription  Yes  Yes  Yes  No  Yes   Weight  4lbs  4lbs  4lbs  -  5lbs   Reps  10-15  10-15  10-15  -  10-15   Time  10 Minutes  10 Minutes  10 Minutes  -  10 Minutes     Treadmill   MPH  2.6  2.7  -  -  3   Grade  1  2  -  -  2   Minutes  15  15  -  -  15   METs  3.35  3.81  -  -  4.12     Bike    Level  -  -  1.6  1.8  1.8   Minutes  -  -  15  15  15    METs  -  -  4.53  4.95  4.95     NuStep   Level  4  4  5  5   -   SPM  85  85  85  85  -   Minutes  15  15  15  15   -   METs  2.4  4.1  4.9  4.8  -     Home Exercise Plan   Plans to continue exercise at  -  -  Home (comment) Walking  Home (comment) Walking   Home (comment) Walking    Frequency  -  -  Add 3 additional days to program exercise sessions.  Add 3 additional days to program exercise sessions.  Add 3 additional days to program exercise sessions.   Initial Home Exercises Provided  -  -  12/05/16  12/05/16  12/05/16   Row Name 01/23/17 1029             Response to Exercise   Blood Pressure (Admit)  96/54       Blood Pressure (Exercise)  118/50       Blood Pressure (Exit)  94/52 asymptomatic       Heart Rate (Admit)  76 bpm       Heart Rate (Exercise)  123 bpm       Heart Rate (Exit)  76 bpm       Rating of Perceived Exertion (Exercise)  16       Symptoms  none       Duration  Continue with 30 min of aerobic exercise without signs/symptoms of physical distress.       Intensity  THRR unchanged         Progression   Progression  Continue to progress workloads to maintain intensity without signs/symptoms of physical distress.       Average METs  4.8         Resistance Training   Training Prescription  Yes       Weight  5lb       Reps  10-15       Time  10 Minutes         Treadmill   MPH  3       Grade  2       Minutes  15       METs  4.12         Bike   Level  -       Minutes  -       METs  -         NuStep   Level  5       SPM  85       Minutes  15       METs  4.5         Home Exercise Plan   Plans to continue exercise at  Home (comment) Walking        Frequency  Add 3 additional days to program exercise sessions.       Initial Home Exercises Provided  12/05/16          Exercise Comments: Exercise Comments    Row Name 11/21/16 1602 12/24/16 1635 01/28/17 1257       Exercise Comments  Pt  was oriented to exercise equipment on 11/21/16. Pt did well with first exercise session.  Pt is responding well to exercise prescription.  Will continue to monitor and progress well.   Reviewed METs and goals. Pt is tolerating exercise prescription very well without compliants of CP, dizziness or SOB. Will continue to progress activity levels as tolerated        Exercise Goals and Review: Exercise Goals    Row Name 11/18/16 1551             Exercise Goals   Increase Physical Activity  Yes       Intervention  Provide advice, education, support and counseling about physical activity/exercise needs.;Develop an individualized exercise prescription for aerobic  and resistive training based on initial evaluation findings, risk stratification, comorbidities and participant's personal goals.       Expected Outcomes  Achievement of increased cardiorespiratory fitness and enhanced flexibility, muscular endurance and strength shown through measurements of functional capacity and personal statement of participant.       Increase Strength and Stamina  Yes       Intervention  Provide advice, education, support and counseling about physical activity/exercise needs.;Develop an individualized exercise prescription for aerobic and resistive training based on initial evaluation findings, risk stratification, comorbidities and participant's personal goals.       Expected Outcomes  Achievement of increased cardiorespiratory fitness and enhanced flexibility, muscular endurance and strength shown through measurements of functional capacity and personal statement of participant.       Able to understand and use rate of perceived exertion (RPE) scale  Yes       Intervention  Provide education and explanation on how to use RPE scale       Expected Outcomes  Short Term: Able to use RPE daily in rehab to express subjective intensity level;Long Term:  Able to use RPE to guide intensity level when exercising independently        Knowledge and understanding of Target Heart Rate Range (THRR)  Yes       Intervention  Provide education and explanation of THRR including how the numbers were predicted and where they are located for reference       Expected Outcomes  Short Term: Able to state/look up THRR;Long Term: Able to use THRR to govern intensity when exercising independently;Short Term: Able to use daily as guideline for intensity in rehab       Able to check pulse independently  Yes       Intervention  Provide education and demonstration on how to check pulse in carotid and radial arteries.;Review the importance of being able to check your own pulse for safety during independent exercise       Expected Outcomes  Long Term: Able to check pulse independently and accurately;Short Term: Able to explain why pulse checking is important during independent exercise       Understanding of Exercise Prescription  Yes       Intervention  Provide education, explanation, and written materials on patient's individual exercise prescription       Expected Outcomes  Short Term: Able to explain program exercise prescription;Long Term: Able to explain home exercise prescription to exercise independently          Exercise Goals Re-Evaluation : Exercise Goals Re-Evaluation    Row Name 12/04/16 1606 12/24/16 1638 01/28/17 1255         Exercise Goal Re-Evaluation   Exercise Goals Review  Increase Physical Activity;Understanding of Exercise Prescription;Increase Strength and Stamina;Knowledge and understanding of Target Heart Rate Range (THRR);Able to understand and use rate of perceived exertion (RPE) scale;Able to check pulse independently  Increase Physical Activity  Increase Physical Activity;Understanding of Exercise Prescription;Increase Strength and Stamina;Knowledge and understanding of Target Heart Rate Range (THRR);Able to understand and use rate of perceived exertion (RPE) scale;Able to check pulse independently     Comments  Pt  tolerates WL increases very well; pt demonstrates proper use of RPE scale.  Pt tolerates WL increases very well; pt demonstrates proper use of RPE scale.  Pt is making great progress in cardiac rehab and tolerates WL increases very well. Pt stated "feeling better" and "having more energy." Pt is also compliant with home exercise.  Expected Outcomes  Pt will exercise for 30 minutes without difficulty to improve cardiorespiratory fitness.   Pt will continue to exercise 3x a week in cardiac rehab, in addition to walking at home 2-3 days for 30 minutes.   Pt will continue to exercise 3x a week in cardiac rehab, in addition to walking at home 2-3 days for 30 minutes.          Discharge Exercise Prescription (Final Exercise Prescription Changes): Exercise Prescription Changes - 01/23/17 1029      Response to Exercise   Blood Pressure (Admit)  96/54    Blood Pressure (Exercise)  118/50    Blood Pressure (Exit)  94/52 asymptomatic    Heart Rate (Admit)  76 bpm    Heart Rate (Exercise)  123 bpm    Heart Rate (Exit)  76 bpm    Rating of Perceived Exertion (Exercise)  16    Symptoms  none    Duration  Continue with 30 min of aerobic exercise without signs/symptoms of physical distress.    Intensity  THRR unchanged      Progression   Progression  Continue to progress workloads to maintain intensity without signs/symptoms of physical distress.    Average METs  4.8      Resistance Training   Training Prescription  Yes    Weight  5lb    Reps  10-15    Time  10 Minutes      Treadmill   MPH  3    Grade  2    Minutes  15    METs  4.12      Bike   Level  --    Minutes  --    METs  --      NuStep   Level  5    SPM  85    Minutes  15    METs  4.5      Home Exercise Plan   Plans to continue exercise at  Home (comment) Walking     Frequency  Add 3 additional days to program exercise sessions.    Initial Home Exercises Provided  12/05/16       Nutrition:  Target Goals:  Understanding of nutrition guidelines, daily intake of sodium <1558m, cholesterol <2045m calories 30% from fat and 7% or less from saturated fats, daily to have 5 or more servings of fruits and vegetables.  Biometrics: Pre Biometrics - 11/18/16 1552      Pre Biometrics   Height  5' 10"  (1.778 m)    Weight  189 lb 13.1 oz (86.1 kg)    Waist Circumference  39 inches    Hip Circumference  39.75 inches    Waist to Hip Ratio  0.98 %    BMI (Calculated)  27.24    Triceps Skinfold  14 mm    % Body Fat  25.9 %    Grip Strength  40.5 kg    Flexibility  11 in    Single Leg Stand  5.5 seconds        Nutrition Therapy Plan and Nutrition Goals:   Nutrition Assessments:   Nutrition Goals Re-Evaluation:   Nutrition Goals Re-Evaluation:   Nutrition Goals Discharge (Final Nutrition Goals Re-Evaluation):   Psychosocial: Target Goals: Acknowledge presence or absence of significant depression and/or stress, maximize coping skills, provide positive support system. Participant is able to verbalize types and ability to use techniques and skills needed for reducing stress and depression.  Initial Review & Psychosocial  Screening: Initial Psych Review & Screening - 11/18/16 1610      Initial Review   Current issues with  None Identified      Family Dynamics   Good Support System?  Yes wife     Comments  upon brief assessment, no psychosocial needs identified, no interventions necessary. pt does exhibit health related anxiety       Barriers   Psychosocial barriers to participate in program  There are no identifiable barriers or psychosocial needs.      Screening Interventions   Interventions  Encouraged to exercise;Provide feedback about the scores to participant       Quality of Life Scores: Quality of Life - 01/23/17 1635      Quality of Life Scores   Health/Function Pre  26.8 %    Socioeconomic Pre  27.43 %    Psych/Spiritual Pre  30 %    Family Pre  28.8 %    GLOBAL Pre   27.88 % overall pt demonstrates positive outlook with good coping skills.       Scores of 19 and below usually indicate a poorer quality of life in these areas.  A difference of  2-3 points is a clinically meaningful difference.  A difference of 2-3 points in the total score of the Quality of Life Index has been associated with significant improvement in overall quality of life, self-image, physical symptoms, and general health in studies assessing change in quality of life.  PHQ-9: Recent Review Flowsheet Data    Depression screen [facility] 2/9 11/21/2016 09/30/2016 09/25/2016 09/22/2016   Decreased Interest 0 0 0 0   Down, Depressed, Hopeless 0 0 0 0   PHQ - 2 Score 0 0 0 0     Interpretation of Total Score  Total Score Depression Severity:  1-4 = Minimal depression, 5-9 = Mild depression, 10-14 = Moderate depression, 15-19 = Moderately severe depression, 20-27 = Severe depression   Psychosocial Evaluation and Intervention: Psychosocial Evaluation - 11/21/16 1444      Psychosocial Evaluation & Interventions   Interventions  Encouraged to exercise with the program and follow exercise prescription    Comments  no psyschosocial needs identified, no interventions necessary     Expected Outcomes  pt will exhibit positive outlook with good coping skills.     Continue Psychosocial Services   No Follow up required       Psychosocial Re-Evaluation: Psychosocial Re-Evaluation    [place] Name 12/05/16 1710 01/02/17 1233 01/23/17 1634         Psychosocial Re-Evaluation   Current issues with  None Identified  None Identified  None Identified     Comments  no psychosocial needs identified, no interventions necessary.   no psychosocial needs identified, no interventions necessary.   no psychosocial needs identified, no interventions necessary. pt excited about new hobby [place].  He is pleased it will be relaxing activity that includes physical activity.      Expected Outcomes  pt will exhibit  positive outlook with good coping skills.   pt will exhibit positive outlook with good coping skills.   pt will exhibit positive outlook with good coping skills.      Interventions  Encouraged to attend Cardiac Rehabilitation for the exercise;Stress management education;Relaxation education  Encouraged to attend Cardiac Rehabilitation for the exercise;Stress management education;Relaxation education  Encouraged to attend Cardiac Rehabilitation for the exercise;Stress management education;Relaxation education     Continue Psychosocial Services   No Follow up required  No  Follow up required  No Follow up required        Psychosocial Discharge (Final Psychosocial Re-Evaluation): Psychosocial Re-Evaluation - 01/23/17 1634      Psychosocial Re-Evaluation   Current issues with  None Identified    Comments  no psychosocial needs identified, no interventions necessary. pt excited about new hobby [place].  He is pleased it will be relaxing activity that includes physical activity.     Expected Outcomes  pt will exhibit positive outlook with good coping skills.     Interventions  Encouraged to attend Cardiac Rehabilitation for the exercise;Stress management education;Relaxation education    Continue Psychosocial Services   No Follow up required       Vocational Rehabilitation: Provide vocational rehab assistance to qualifying candidates.   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 Participant will state understanding/return demonstration of topics presented.  Learning Barriers/Preferences:   Education Topics: Count Your Pulse:  -Group instruction provided by verbal instruction, demonstration, patient  participation and written materials to support subject.  Instructors address importance of being able to find your pulse and how to count your pulse when at home without a heart monitor.  Patients get hands on experience counting their pulse with staff help and individually.   Heart Attack, Angina, and Risk Factor Modification:  -Group instruction provided by verbal instruction, video, and written materials to support subject.  Instructors address signs and symptoms of angina and heart attacks.    Also discuss risk factors for heart disease and how to make changes to improve heart health risk factors.   CARDIAC REHAB PHASE II EXERCISE from 01/23/2017 in [place]  Date  01/07/17  Instruction Review Code  2- meets goals/outcomes      Functional Fitness:  -Group instruction provided by verbal instruction, demonstration, patient participation, and written materials to support subject.  Instructors address safety measures for doing things around the house.  Discuss how to get up and down off the floor, how to pick things up properly, how to safely get out of a chair without assistance, and balance training.   Meditation and Mindfulness:  -Group instruction provided by verbal instruction, patient participation, and written materials to support subject.  Instructor addresses importance of mindfulness and meditation practice to help reduce stress and improve awareness.  Instructor also leads participants through a meditation exercise.    Stretching for Flexibility and Mobility:  -Group instruction provided by verbal instruction, patient participation, and written materials to support subject.  Instructors lead participants through series of stretches that are designed to increase flexibility thus improving mobility.  These stretches are additional exercise for major muscle groups that are typically performed during regular warm up and cool down.   Hands Only CPR:   -Group verbal, video, and participation provides a basic overview of AHA guidelines for community CPR. Role-play of emergencies allow participants the opportunity to practice calling for help and chest compression technique with discussion of AED use.   Hypertension: -Group verbal and written instruction that provides a basic overview of hypertension including the most recent diagnostic guidelines, risk factor reduction with self-care instructions and medication management.    Nutrition I class: Heart Healthy Eating:  -Group instruction  provided by PowerPoint slides, verbal discussion, and written materials to support subject matter. The instructor gives an explanation and review of the Therapeutic Lifestyle Changes diet recommendations, which includes a discussion on lipid goals, dietary fat, sodium, fiber, plant stanol/sterol esters, sugar, and the components of a well-balanced, healthy diet.   CARDIAC REHAB PHASE II EXERCISE from 01/23/2017 in [place]  Date  01/02/17  Educator  RD  Instruction Review Code  Not applicable      Nutrition II class: Lifestyle Skills:  -Group instruction provided by PowerPoint slides, verbal discussion, and written materials to support subject matter. The instructor gives an explanation and review of label reading, grocery shopping for heart health, heart healthy recipe modifications, and ways to make healthier choices when eating out.   CARDIAC REHAB PHASE II EXERCISE from 01/23/2017 in [place]  Date  01/02/17  Educator  RD  Instruction Review Code  Not applicable      Diabetes Question & Answer:  -Group instruction provided by PowerPoint slides, verbal discussion, and written materials to support subject matter. The instructor gives an explanation and review of diabetes co-morbidities, pre- and post-prandial blood glucose goals, pre-exercise blood glucose goals, signs, symptoms, and  treatment of hypoglycemia and hyperglycemia, and foot care basics.   CARDIAC REHAB PHASE II EXERCISE from 01/23/2017 in [place]  Date  01/23/17  Educator  RD  Instruction Review Code  2- meets goals/outcomes      Diabetes Blitz:  -Group instruction provided by PowerPoint slides, verbal discussion, and written materials to support subject matter. The instructor gives an explanation and review of the physiology behind type 1 and type 2 diabetes, diabetes medications and rational behind using different medications, pre- and post-prandial blood glucose recommendations and Hemoglobin A1c goals, diabetes diet, and exercise including blood glucose guidelines for exercising safely.    Portion Distortion:  -Group instruction provided by PowerPoint slides, verbal discussion, written materials, and food models to support subject matter. The instructor gives an explanation of serving size versus portion size, changes in portions sizes over the last 20 years, and what consists of a serving from each food group.   CARDIAC REHAB PHASE II EXERCISE from 01/23/2017 in [place]  Date  12/24/16  Educator  RD  Instruction Review Code  2- meets goals/outcomes      Stress Management:  -Group instruction provided by verbal instruction, video, and written materials to support subject matter.  Instructors review role of stress in heart disease and how to cope with stress positively.     Exercising on Your Own:  -Group instruction provided by verbal instruction, power point, and written materials to support subject.  Instructors discuss benefits of exercise, components of exercise, frequency and intensity of exercise, and end points for exercise.  Also discuss use of nitroglycerin and activating EMS.  Review options of places to exercise outside of rehab.  Review guidelines for sex with heart disease.   CARDIAC REHAB PHASE II EXERCISE from 01/23/2017  in [place]  Date  01/21/17  Educator  EP  Instruction Review Code  2- meets goals/outcomes      Cardiac Drugs I:  -Group instruction provided by verbal instruction and written materials to support subject.  Instructor reviews cardiac drug classes: antiplatelets, anticoagulants, beta blockers, and statins.  Instructor discusses reasons, side effects, and lifestyle considerations for each drug class.   Cardiac Drugs  II:  -Group instruction provided by verbal instruction and written materials to support subject.  Instructor reviews cardiac drug classes: angiotensin converting enzyme inhibitors (ACE-I), angiotensin II receptor blockers (ARBs), nitrates, and calcium channel blockers.  Instructor discusses reasons, side effects, and lifestyle considerations for each drug class.   CARDIAC REHAB PHASE II EXERCISE from 01/23/2017 in [place]  Date  12/17/16  Instruction Review Code  2- meets goals/outcomes      Anatomy and Physiology of the Circulatory System:  Group verbal and written instruction and models provide basic cardiac anatomy and physiology, with the coronary electrical and arterial systems. Review of: AMI, Angina, Valve disease, Heart Failure, Peripheral Artery Disease, Cardiac Arrhythmia, Pacemakers, and the ICD.   CARDIAC REHAB PHASE II EXERCISE from 01/23/2017 in [place]  Date  12/03/16  Instruction Review Code  2- meets goals/outcomes      Other Education:  -Group or individual verbal, written, or video instructions that support the educational goals of the cardiac rehab program.   Knowledge Questionnaire Score: Knowledge Questionnaire Score - 11/18/16 1553      Knowledge Questionnaire Score   Pre Score  22/24       Core Components/Risk Factors/Patient Goals at Admission: Personal Goals and Risk Factors at Admission - 11/18/16 1441      Core Components/Risk  Factors/Patient Goals on Admission    Weight Management  Yes;Weight Maintenance    Intervention  Weight Management: Develop a combined nutrition and exercise program designed to reach desired caloric intake, while maintaining appropriate intake of nutrient and fiber, sodium and fats, and appropriate energy expenditure required for the weight goal.;Weight Management: Provide education and appropriate resources to help participant work on and attain dietary goals.;Weight Management/Obesity: Establish reasonable short term and long term weight goals.    Expected Outcomes  Short Term: Continue to assess and modify interventions until short term weight is achieved;Weight Maintenance: Understanding of the daily nutrition guidelines, which includes 25-35% calories from fat, 7% or less cal from saturated fats, less than 270m cholesterol, less than 1.5gm of sodium, & 5 or more servings of fruits and vegetables daily       Core Components/Risk Factors/Patient Goals Review:  Goals and Risk Factor Review    Row Name 12/05/16 1708 01/02/17 1233 01/23/17 1633         Core Components/Risk Factors/Patient Goals Review   Personal Goals Review  Other;Weight Management/Obesity  Other;Weight Management/Obesity  Other;Weight Management/Obesity     Review  pt with few CAD RF demonstrates eagerness to participate in CR opportunities.   pt with few CAD RF demonstrates eagerness to participate in CR opportunities.   pt with few CAD RF demonstrates eagerness to participate in CR opportunities. pt looking forward to repeat ECHO with hope for mproved results.       Expected Outcomes  pt will participate in CR exercise, nutrition and lifestyle modification to prevent disease progression.   pt will participate in CR exercise, nutrition and lifestyle modification to prevent disease progression.   pt will participate in CR exercise, nutrition and lifestyle modification to prevent disease progression.         Core  Components/Risk Factors/Patient Goals at Discharge (Final Review):  Goals and Risk Factor Review - 01/23/17 1633      Core Components/Risk Factors/Patient Goals Review   Personal Goals Review  Other;Weight Management/Obesity    Review  pt with few CAD RF demonstrates eagerness to participate in CR opportunities. pt  looking forward to repeat ECHO with hope for mproved results.      Expected Outcomes  pt will participate in CR exercise, nutrition and lifestyle modification to prevent disease progression.        ITP Comments: ITP Comments    Row Name 11/18/16 1547 12/05/16 1708 01/02/17 1233 01/23/17 1632     ITP Comments  Medical Director- Dr. Fransico Him, MD.  30 day ITP review.  pt with good attendance and participation.   30 day ITP review.  pt with good attendance and participation.   30 day ITP review.  pt with good attendance and participation.        Comments:

## 2017-01-30 ENCOUNTER — Encounter (HOSPITAL_COMMUNITY)
Admission: RE | Admit: 2017-01-30 | Discharge: 2017-01-30 | Disposition: A | Discharge status: Home / Self Care | Payer: 59 | Source: Ambulatory Visit | Attending: Cardiology | Admitting: Cardiology

## 2017-01-30 DIAGNOSIS — I509 Heart failure, unspecified: Secondary | ICD-10-CM | POA: Diagnosis not present

## 2017-01-30 DIAGNOSIS — Z955 Presence of coronary angioplasty implant and graft: Secondary | ICD-10-CM

## 2017-02-02 ENCOUNTER — Encounter (HOSPITAL_COMMUNITY): Payer: 59

## 2017-02-04 ENCOUNTER — Encounter (HOSPITAL_COMMUNITY): Payer: 59

## 2017-02-06 ENCOUNTER — Encounter (HOSPITAL_COMMUNITY)
Admission: RE | Admit: 2017-02-06 | Discharge: 2017-02-06 | Disposition: A | Discharge status: Home / Self Care | Payer: 59 | Source: Ambulatory Visit | Attending: Cardiology | Admitting: Cardiology

## 2017-02-06 DIAGNOSIS — Z87891 Personal history of nicotine dependence: Secondary | ICD-10-CM | POA: Insufficient documentation

## 2017-02-06 DIAGNOSIS — Z955 Presence of coronary angioplasty implant and graft: Secondary | ICD-10-CM | POA: Insufficient documentation

## 2017-02-06 DIAGNOSIS — I509 Heart failure, unspecified: Secondary | ICD-10-CM | POA: Diagnosis not present

## 2017-02-06 DIAGNOSIS — I420 Dilated cardiomyopathy: Secondary | ICD-10-CM | POA: Insufficient documentation

## 2017-02-06 DIAGNOSIS — Z7982 Long term (current) use of aspirin: Secondary | ICD-10-CM | POA: Insufficient documentation

## 2017-02-06 DIAGNOSIS — Z79899 Other long term (current) drug therapy: Secondary | ICD-10-CM | POA: Diagnosis not present

## 2017-02-09 ENCOUNTER — Encounter (HOSPITAL_COMMUNITY): Payer: 59

## 2017-02-11 ENCOUNTER — Encounter (HOSPITAL_COMMUNITY)
Admission: RE | Admit: 2017-02-11 | Discharge: 2017-02-11 | Disposition: A | Discharge status: Home / Self Care | Payer: 59 | Source: Ambulatory Visit | Attending: Cardiology | Admitting: Cardiology

## 2017-02-11 DIAGNOSIS — I509 Heart failure, unspecified: Secondary | ICD-10-CM | POA: Diagnosis not present

## 2017-02-11 DIAGNOSIS — Z955 Presence of coronary angioplasty implant and graft: Secondary | ICD-10-CM

## 2017-02-13 ENCOUNTER — Encounter (HOSPITAL_COMMUNITY)
Admission: RE | Admit: 2017-02-13 | Discharge: 2017-02-13 | Disposition: A | Discharge status: Home / Self Care | Payer: 59 | Source: Ambulatory Visit | Attending: Cardiology | Admitting: Cardiology

## 2017-02-13 DIAGNOSIS — Z955 Presence of coronary angioplasty implant and graft: Secondary | ICD-10-CM

## 2017-02-13 DIAGNOSIS — I509 Heart failure, unspecified: Secondary | ICD-10-CM | POA: Diagnosis not present

## 2017-02-19 ENCOUNTER — Encounter: Payer: Self-pay | Admitting: Internal Medicine

## 2017-02-25 ENCOUNTER — Ambulatory Visit: Payer: 59 | Admitting: Internal Medicine

## 2017-02-25 ENCOUNTER — Encounter (INDEPENDENT_AMBULATORY_CARE_PROVIDER_SITE_OTHER): Payer: Self-pay

## 2017-02-25 VITALS — BP 108/60 | HR 68 | Ht 70.0 in | Wt 194.0 lb

## 2017-02-25 DIAGNOSIS — I447 Left bundle-branch block, unspecified: Secondary | ICD-10-CM | POA: Diagnosis not present

## 2017-02-25 NOTE — Patient Instructions (Addendum)
Medication Instructions:  Your physician recommends that you continue on your current medications as directed. Please refer to the Current Medication list given to you today.  Labwork: You will need to have labs drawn today: BMP CBC  Testing/Procedures: Your physician has recommended that you have a defibrillator inserted. An implantable cardioverter defibrillator (ICD) is a small device that is placed in your chest or, in rare cases, your abdomen. This device uses electrical pulses or shocks to help control life-threatening, irregular heartbeats that could lead the heart to suddenly stop beating (sudden cardiac arrest). Leads are attached to the ICD that goes into your heart. This is done in the hospital and usually requires an overnight stay. Please see the instruction sheet given to you today for more information.  Please arrive at the [facility] main entrance of [place] hospital at:   March 11 2017 at 1130am; your procedure is scheduled for 1:30pm Do not eat or drink after midnight prior to procedure Take all your medications as normal EXCEPT Lasix and Spironolactone. Plan for one night stay You will need someone to drive you home at discharge  Follow directions for your surgical scrub the night before and day of your procedure.   Follow-Up: Your physician recommends that you schedule a follow-up appointment in:  10-14 days with the device clinic for a wound check 91 days with Dr [name]  Any Other Special Instructions Will Be Listed Below (If Applicable).    Cardioverter Defibrillator Implantation An implantable cardioverter defibrillator (ICD) is a small device that is placed under the skin in the chest or abdomen. An ICD consists of a battery, a small computer (pulse generator), and wires (leads) that go into the heart. An ICD is used to detect and correct two types of dangerous irregular heartbeats (arrhythmias):  A rapid heart rhythm (tachycardia).  An arrhythmia in which  the lower chambers of the heart (ventricles) contract in an uncoordinated way (fibrillation).  When an ICD detects tachycardia, it sends a low-energy shock to the heart to restore the heartbeat to normal (cardioversion). This signal is usually painless. If cardioversion does not work or if the ICD detects fibrillation, it delivers a high-energy shock to the heart (defibrillation) to restart the heart. This shock may feel like a strong jolt in the chest. Your health care provider may prescribe an ICD if:  You have had an arrhythmia that originated in the ventricles.  Your heart has been damaged by a disease or heart condition.  Sometimes, ICDs are programmed to act as a device called a pacemaker. Pacemakers can be used to treat a slow heartbeat (bradycardia) or tachycardia by taking over the heart rate with electrical impulses. Tell a health care provider about:  Any allergies you have.  All medicines you are taking, including vitamins, herbs, eye drops, creams, and over-the-counter medicines.  Any problems you or family members have had with anesthetic medicines.  Any blood disorders you have.  Any surgeries you have had.  Any medical conditions you have.  Whether you are pregnant or may be pregnant. What are the risks? Generally, this is a safe procedure. However, problems may occur, including:  Swelling, bleeding, or bruising.  Infection.  Blood clots.  Damage to other structures or organs, such as nerves, blood vessels, or the heart.  Allergic reactions to medicines used during the procedure.  What happens before the procedure? Staying hydrated Follow instructions from your health care provider about hydration, which may include:  Up to 2 hours before  the procedure - you may continue to drink clear liquids, such as water, clear fruit juice, black coffee, and plain tea.  Eating and drinking restrictions Follow instructions from your health care provider about eating and  drinking, which may include:  8 hours before the procedure - stop eating heavy meals or foods such as meat, fried foods, or fatty foods.  6 hours before the procedure - stop eating light meals or foods, such as toast or cereal.  6 hours before the procedure - stop drinking milk or drinks that contain milk.  2 hours before the procedure - stop drinking clear liquids.  Medicine Ask your health care provider about:  Changing or stopping your normal medicines. This is important if you take diabetes medicines or blood thinners.  Taking medicines such as aspirin and ibuprofen. These medicines can thin your blood. Do not take these medicines before your procedure if your doctor tells you not to.  Tests  You may have blood tests.  You may have a test to check the electrical signals in your heart (electrocardiogram, ECG).  You may have imaging tests, such as a chest X-ray. General instructions  For 24 hours before the procedure, stop using products that contain nicotine or tobacco, such as cigarettes and e-cigarettes. If you need help quitting, ask your health care provider.  Plan to have someone take you home from the hospital or clinic.  You may be asked to shower with a germ-killing soap. What happens during the procedure?  To reduce your risk of infection: ? Your health care team will wash or sanitize their hands. ? Your skin will be washed with soap. ? Hair may be removed from the surgical area.  Small monitors will be put on your body. They will be used to check your heart, blood pressure, and oxygen level.  An IV tube will be inserted into one of your veins.  You will be given one or more of the following: ? A medicine to help you relax (sedative). ? A medicine to numb the area (local anesthetic). ? A medicine to make you fall asleep (general anesthetic).  Leads will be guided through a blood vessel into your heart and attached to your heart muscles. Depending on the ICD,  the leads may go into one ventricle or they may go into both ventricles and into an upper chamber of the heart. An X-ray machine (fluoroscope) will be usedto help guide the leads.  A small incision will be made to create a deep pocket under your skin.  The pulse generator will be placed into the pocket.  The ICD will be tested.  The incision will be closed with stitches (sutures), skin glue, or staples.  A bandage (dressing) will be placed over the incision. This procedure may vary among health care providers and hospitals. What happens after the procedure?  Your blood pressure, heart rate, breathing rate, and blood oxygen level will be monitored often until the medicines you were given have worn off.  A chest X-ray will be taken to check that the ICD is in the right place.  You will need to stay in the hospital for 1-2 days so your health care provider can make sure your ICD is working.  Do not drive for 24 hours if you received a sedative. Ask your health care provider when it is safe for you to drive.  You may be given an identification card explaining that you have an ICD. Summary  An implantable cardioverter defibrillator (  ICD) is a small device that is placed under the skin in the chest or abdomen. It is used to detect and correct dangerous irregular heartbeats (arrhythmias).  An ICD consists of a battery, a small computer (pulse generator), and wires (leads) that go into the heart.  When an ICD detects rapid heart rhythm (tachycardia), it sends a low-energy shock to the heart to restore the heartbeat to normal (cardioversion). If cardioversion does not work or if the ICD detects uncoordinated heart contractions (fibrillation), it delivers a high-energy shock to the heart (defibrillation) to restart the heart.  You will need to stay in the hospital for 1-2 days to make sure your ICD is working. This information is not intended to replace advice given to you by your health care  provider. Make sure you discuss any questions you have with your health care provider. Document Released: 09/14/2001 Document Revised: 01/02/2016 Document Reviewed: 01/02/2016 Elsevier Interactive Patient Education  2017 [employer].    Biventricular Pacemaker Implantation A biventricular pacemaker implantation is a procedure to place (implant) a pacemaker into both of the lower chambers (ventricles) of the heart. A pacemaker is a small, battery-powered device that helps control the heartbeat. If the heart beats irregularly or too slowly (bradycardia), the pacemaker will pace the heart so that it beats at a normal rate or a programmed rate. The parts of a biventricular pacemaker include:  The pulse generator. The pulse generator contains a small computer and a memory system that is programmed to keep the heart beating at a certain rate. The pulse generator also produces the electrical signal that triggers the heart to beat. This is implanted under the skin of the upper chest, near the collarbone.  Wires (leads). The leads are placed in the left and right ventricles of the heart. The leads are connected to the pulse generator. They transmit electrical pulses from the pulse generator to the heart.  This procedure may be done to treat:  Bradycardia.  Symptoms of severe heart failure, such as shortness of breath (dyspnea).  Loss of consciousness that happens repeatedly (syncope) because of an irregular heart rate.  Tell a health care provider about:  Any allergies you have.  All medicines you are taking, including vitamins, herbs, eye drops, creams, and over-the-counter medicines.  Any problems you or family members have had with anesthetic medicines.  Any blood disorders you have.  Any surgeries you have had.  Any medical conditions you have.  Whether you are pregnant or may be pregnant. What are the risks? Generally, this is a safe procedure. However, problems may occur,  including:  Infection.  Bleeding.  Allergic reactions to medicines or dyes.  Damage to other structures or organs, such as your blood vessels, lungs, or heart.  Failure of the pacemaker to improve your condition.  What happens before the procedure?  Ask your health care provider about: ? Changing or stopping your regular medicines. This is especially important if you are taking diabetes medicines or blood thinners. ? Taking medicines such as aspirin and ibuprofen. These medicines can thin your blood. Do not take these medicines before your procedure if your health care provider instructs you not to.  Follow instructions from your health care provider about eating or drinking restrictions.  Do not use any tobacco products for at least 24 hours before your procedure. This includes cigarettes, chewing tobacco, or e-cigarettes.  Ask your health care provider how your surgical site will be marked or identified.  You may be given antibiotic  medicine to help prevent infection.  You may have tests, including: ? Blood tests. ? Chest X-rays.  Plan to have someone take you home after the procedure.  If you go home right after the procedure, plan to have someone with you for 24 hours. What happens during the procedure?  To reduce your risk of infection: ? Your health care team will wash or sanitize their hands. ? Your skin will be washed with soap. ? Hair may be removed from your surgical area.  An IV tube will be inserted into one of your veins.  You will be given one or more of the following: ? A medicine to help you relax (sedative). ? A medicine to make you fall asleep (general anesthetic). ? A medicine that is injected into your spine to numb the area below and slightly above the injection site (spinal anesthetic). ? A medicine that is injected into an area of your body to numb everything below the injection site (regional anesthetic).  An incision will be made in your upper  chest, near your heart.  The leads will be guided into your incision, through your blood vessels, and into your ventricles. Your surgeon will use an X-ray machine (fluoroscope) to guide the leads into your heart.  The leads will be attached to your heart muscles and to the pulse generator.  The leads will be tested to make sure that they work correctly.  The pulse generator will be implanted under your skin, near your incision.  Your incision will be closed with stitches (sutures), skin glue, or adhesive tape.  A bandage (dressing) will be placed over your incision. The procedure may vary among health care providers and hospitals. What happens after the procedure?  Your blood pressure, heart rate, breathing rate, and blood oxygen level will be monitored often until the medicines you were given have worn off.  You may continue to receive fluids and medicines through an IV tube.  You will have some pain. Pain medicines will be available to help you.  You will have a chest X-ray done. This is to make sure that your pacemaker is in the right place.  You may have to wear compression stockings. These stockings help to prevent blood clots and reduce swelling in your legs.  You will be given a pacemaker identification card. This card lists the implant date, device model, and manufacturer of your pacemaker.  Do not drive for 24 hours if you received a sedative. This information is not intended to replace advice given to you by your health care provider. Make sure you discuss any questions you have with your health care provider. Document Released: 09/17/2011 Document Revised: 05/31/2015 Document Reviewed: 09/17/2014 Elsevier Interactive Patient Education  [employer].      If you need a refill on your cardiac medications before your next appointment, please call your pharmacy.

## 2017-02-25 NOTE — H&P (View-Only) (Signed)
ELECTROPHYSIOLOGY CONSULT NOTE  Patient ID: Miguel Pena, MRN: [id number], DOB/AGE: [birth year] 53 y.o. Admit date: (Not on file) Date of Consult: 02/25/2017  Primary Physician: [name], MD Primary Cardiologist: PM     Miguel Pena is a 52 y.o. male who is being seen today for the evaluation of ICD_CRT  at the request of PM.    HPI Miguel Pena is a 52 y.o. male with a nonischemic cardiomyopathy but concomitant coronary disease who presented with heart failure October 2018.  He had developed dyspnea on exertion, peripheral edema early satiety.  Evaluation had demonstrated an ejection fraction of 25%.  He underwent catheterization as noted below.  He was started on guideline directed medical therapy.  His ejection fraction has not changed as noted below.  He has modest dyspnea on exertion.  Satiety is improved as he has been taking his medicines.  He does not have chest pain.  Does not have nocturnal dyspnea orthopnea or peripheral edema.  No sleep disturbance.  No palpitations and no syncope.  This is been a major struggle for him.  Depression and sadness  DATE TEST EF   10/18 Cath   % DES OM1  2/19 Echo  20% Mod-severe MR            Past Medical History:  Diagnosis Date  . Bursitis of right shoulder   . CHF (congestive heart failure) ([place])    [name] 10/24/2016  . Dilated cardiomyopathy ([place])    [name] 10/24/2016  . Dysrhythmia 1995   "VT" per patient- states had neg stress test and no problems since  . Heart murmur    "when I was young"  . History of kidney stones   . Hypercholesterolemia       Surgical History:  Past Surgical History:  Procedure Laterality Date  . CORONARY ANGIOPLASTY WITH STENT PLACEMENT  10/28/2016  . CORONARY STENT INTERVENTION N/A 10/28/2016   Procedure: CORONARY STENT INTERVENTION;  Surgeon: [name], MD;  Location: [facility];  Service: Cardiovascular;  Laterality: N/A;  . INGUINAL HERNIA REPAIR  Right 02/16/2013   Procedure: RIGHT INGUINAL HERNIA REPAIR ;  Surgeon: [name], MD;  Location: [facility];  Service: General;  Laterality: Right;  With MESH  . INGUINAL HERNIA REPAIR Right 02/16/2013  . INTRAVASCULAR PRESSURE WIRE/FFR STUDY N/A 10/28/2016   Procedure: INTRAVASCULAR PRESSURE WIRE/FFR STUDY;  Surgeon: [name], MD;  Location: [facility];  Service: Cardiovascular;  Laterality: N/A;  . RIGHT/LEFT HEART CATH AND CORONARY ANGIOGRAPHY N/A 10/28/2016   Procedure: RIGHT/LEFT HEART CATH AND CORONARY ANGIOGRAPHY;  Surgeon: [name], MD;  Location: [facility];  Service: Cardiovascular;  Laterality: N/A;  . ULTRASOUND GUIDANCE FOR VASCULAR ACCESS  10/28/2016   Procedure: Ultrasound Guidance For Vascular Access;  Surgeon: [name], MD;  Location: [facility];  Service: Cardiovascular;;     Home Meds: Prior to Admission medications   Medication Sig Start Date End Date Taking? Authorizing Provider  aspirin 81 MG chewable tablet Chew 1 tablet (81 mg total) by mouth daily. 10/29/16   Patwardhan, Reynold Bowen, MD  bisoprolol (ZEBETA) 5 MG tablet Take 1 tablet (5 mg total) by mouth daily. 10/28/16   Patwardhan, Reynold Bowen, MD  clopidogrel (PLAVIX) 75 MG tablet Take 1 tablet (75 mg total) by mouth daily. 10/29/16   Patwardhan, Reynold Bowen, MD  furosemide (LASIX) 40 MG tablet Take 1 tablet (40 mg  total) by mouth as needed for edema. 10/28/16   Patwardhan, Reynold Bowen, MD  HYDROcodone-acetaminophen (NORCO) 10-325 MG tablet Take 1 tablet by mouth every 4 (four) hours as needed. 09/11/16   [provider]  hydrOXYzine (VISTARIL) 100 MG capsule Take 100 mg by mouth at bedtime. 08/27/16   [provider]  nicotine polacrilex (NICORETTE) 2 MG gum Take 2 mg by mouth as needed for smoking cessation.    [provider]  omega-3 acid ethyl esters (LOVAZA) 1 G capsule Take 1 g by mouth daily as needed (for vitamin).     [provider]  potassium chloride SA (K-DUR,KLOR-CON) 20 MEQ tablet Take 1 tablet (20 mEq total) by mouth daily. 09/22/16   Tereasa Coop, PA-C  rosuvastatin (CRESTOR) 20 MG tablet Take 1 tablet (20 mg total) by mouth daily at 6 PM. 10/29/16   Patwardhan, Manish J, MD  sacubitril-valsartan (ENTRESTO) 49-51 MG Take 1 tablet 2 (two) times daily by mouth.    [provider]  zolpidem (AMBIEN) 5 MG tablet Take 5 mg by mouth at bedtime. 09/04/16   [provider]    Allergies: No Known Allergies  Social History   Socioeconomic History  . Marital status: Married    Spouse name: Not on file  . Number of children: Not on file  . Years of education: Not on file  . Highest education level: Not on file  Social Needs  . Financial resource strain: Not on file  . Food insecurity - worry: Not on file  . Food insecurity - inability: Not on file  . Transportation needs - medical: Not on file  . Transportation needs - non-medical: Not on file  Occupational History  . Not on file  Tobacco Use  . Smoking status: Former Smoker    Packs/day: 1.00    Years: 12.00    Pack years: 12.00    Types: Cigarettes, Cigars    Last attempt to quit: 02/16/2011    Years since quitting: 6.0  . Smokeless tobacco: Former Systems developer    Types: Snuff  . Tobacco comment: 10/28/2016 'chewed for a couple years after I quit smoking"  Substance and Sexual Activity  . Alcohol use: No    Comment: none since 9/11  . Drug use: No  . Sexual activity: Not Currently  Other Topics Concern  . Not on file  Social History Narrative  . Not on file     No family history on file.   ROS:  Please see the history of present illness.     All other systems reviewed and negative.    Physical Exam:  Blood pressure 108/60, pulse 68, height 5' 10"  (1.778 m), weight 194 lb (88 kg). General: Well developed, well nourished male in no acute distress. Head: Normocephalic, atraumatic, sclera non-icteric, no xanthomas, nares  are without discharge. EENT: normal  Lymph Nodes:  none Neck: Negative for carotid bruits. JVD not elevated. Back:without scoliosis kyphosis Lungs: Clear bilaterally to auscultation without wheezes, rales, or rhonchi. Breathing is unlabored. Heart: RRR with S1 S2.  2/6 systolic murmur at the apex murmur . No rubs, or gallops appreciated. Abdomen: Soft, non-tender, non-distended with normoactive bowel sounds. No hepatomegaly. No rebound/guarding. No obvious abdominal masses. Msk:  Strength and tone appear normal for age. Extremities: No clubbing or cyanosis. No edema.  Distal pedal pulses are 2+ and equal bilaterally. Skin: Warm and Dry Neuro: Alert and oriented X 3. CN III-XII intact Grossly normal sensory and motor function .  Psych:  Responds to questions appropriately with a normal affect.      Labs: Cardiac Enzymes No results for input(s): CKTOTAL, CKMB, TROPONINI in the last 72 hours. CBC Lab Results  Component Value Date   WBC 6.4 10/29/2016   HGB 15.3 10/29/2016   HCT 45.7 10/29/2016   MCV 85.9 10/29/2016   PLT 150 10/29/2016   PROTIME: No results for input(s): LABPROT, INR in the last 72 hours. Chemistry No results for input(s): NA, K, CL, CO2, BUN, CREATININE, CALCIUM, PROT, BILITOT, ALKPHOS, ALT, AST, GLUCOSE in the last 168 hours.  Invalid input(s): LABALBU Lipids Lab Results  Component Value Date   CHOL 156 09/25/2016   HDL 26 (L) 09/25/2016   LDLCALC 89 09/25/2016   TRIG 205 (H) 09/25/2016   BNP No results found for: PROBNP Thyroid Function Tests: No results for input(s): TSH, T4TOTAL, T3FREE, THYROIDAB in the last 72 hours.  Invalid input(s): FREET3 Miscellaneous Lab Results  Component Value Date   DDIMER 0.92 (H) 09/25/2016    Radiology/Studies:  No results found.  EKG: Sinus rhythm at 68 Interval 16/18/47  Left bundle branch block    Assessment and Plan:   Nonischemic cardiomyopathy  Coronary artery disease status post DES  stenting  Congestive heart failure-chronic-systolic class IIb  Left bundle branch block  Mitral regurgitation    The patient has persistent left ventricular dysfunction despite guideline directed medical therapy.  He also has ongoing symptoms of congestive heart failure despite salt restriction and good medications.  He is appropriately considered for ICD implantation for primary prevention of sudden death as well as resynchronization to further attenuate sudden death risk as well as to potentially reverse LV dysfunction and improve functional status.  We have discussed extensively the physiology of cardiomyopathy its attendant risks.  We have further discussed the implications of left bundle branch block and the potential role of resynchronization  Perhaps also with structural changes in the LV geometry there may be reduction in his mitral regurgitation  Have reviewed the potential benefits and risks of ICD implantation including but not limited to death, perforation of heart or lung, lead dislodgement, infection,  device malfunction and inappropriate shocks.  The patient and family express understanding  and are willing to proceed.        Virl Axe

## 2017-02-25 NOTE — Progress Notes (Signed)
ELECTROPHYSIOLOGY CONSULT NOTE  Patient ID: Miguel Pena, MRN: [id number], DOB/AGE: 1965/09/17 52 y.o. Admit date: (Not on file) Date of Consult: 02/25/2017  Primary Physician: [name], MD Primary Cardiologist: PM     [name] is a 52 y.o. male who is being seen today for the evaluation of ICD_CRT  at the request of PM.    HPI Miguel Pena is a 52 y.o. male with a nonischemic cardiomyopathy but concomitant coronary disease who presented with heart failure October 2018.  He had developed dyspnea on exertion, peripheral edema early satiety.  Evaluation had demonstrated an ejection fraction of 25%.  He underwent catheterization as noted below.  He was started on guideline directed medical therapy.  His ejection fraction has not changed as noted below.  He has modest dyspnea on exertion.  Satiety is improved as he has been taking his medicines.  He does not have chest pain.  Does not have nocturnal dyspnea orthopnea or peripheral edema.  No sleep disturbance.  No palpitations and no syncope.  This is been a major struggle for him.  Depression and sadness  DATE TEST EF   10/18 Cath   % DES OM1  2/19 Echo  20% Mod-severe MR            Past Medical History:  Diagnosis Date  . Bursitis of right shoulder   . CHF (congestive heart failure) ([place])    Miguel Pena 10/24/2016  . Dilated cardiomyopathy ([place])    Miguel Pena 10/24/2016  . Dysrhythmia 1995   "VT" per patient- states had neg stress test and no problems since  . Heart murmur    "when I was young"  . History of kidney stones   . Hypercholesterolemia       Surgical History:  Past Surgical History:  Procedure Laterality Date  . CORONARY ANGIOPLASTY WITH STENT PLACEMENT  10/28/2016  . CORONARY STENT INTERVENTION N/A 10/28/2016   Procedure: CORONARY STENT INTERVENTION;  Surgeon: [name], MD;  Location: [facility];  Service: Cardiovascular;  Laterality: N/A;  . INGUINAL HERNIA REPAIR  Right 02/16/2013   Procedure: RIGHT INGUINAL HERNIA REPAIR ;  Surgeon: [name], MD;  [facility]: [facility];  Service: General;  Laterality: Right;  With MESH  . INGUINAL HERNIA REPAIR Right 02/16/2013  . INTRAVASCULAR PRESSURE WIRE/FFR STUDY N/A 10/28/2016   Procedure: INTRAVASCULAR PRESSURE WIRE/FFR STUDY;  Surgeon: [name], MD;  Location: [facility];  Service: Cardiovascular;  Laterality: N/A;  . RIGHT/LEFT HEART CATH AND CORONARY ANGIOGRAPHY N/A 10/28/2016   Procedure: RIGHT/LEFT HEART CATH AND CORONARY ANGIOGRAPHY;  Surgeon: [name], MD;  Location: [facility];  Service: Cardiovascular;  Laterality: N/A;  . ULTRASOUND GUIDANCE FOR VASCULAR ACCESS  10/28/2016   Procedure: Ultrasound Guidance For Vascular Access;  Surgeon: [name], MD;  Location: [facility];  Service: Cardiovascular;;     Home Meds: Prior to Admission medications   Medication Sig Start Date End Date Taking? Authorizing Provider  aspirin 81 MG chewable tablet Chew 1 tablet (81 mg total) by mouth daily. 10/29/16   Patwardhan, Reynold Bowen, MD  bisoprolol (ZEBETA) 5 MG tablet Take 1 tablet (5 mg total) by mouth daily. 10/28/16   Patwardhan, Reynold Bowen, MD  clopidogrel (PLAVIX) 75 MG tablet Take 1 tablet (75 mg total) by mouth daily. 10/29/16   Patwardhan, Reynold Bowen, MD  furosemide (LASIX) 40 MG tablet Take 1 tablet (40 mg  total) by mouth as needed for edema. 10/28/16   Patwardhan, Reynold Bowen, MD  HYDROcodone-acetaminophen (NORCO) 10-325 MG tablet Take 1 tablet by mouth every 4 (four) hours as needed. 09/11/16   [provider]  hydrOXYzine (VISTARIL) 100 MG capsule Take 100 mg by mouth at bedtime. 08/27/16   [provider]  nicotine polacrilex (NICORETTE) 2 MG gum Take 2 mg by mouth as needed for smoking cessation.    [provider]  omega-3 acid ethyl esters (LOVAZA) 1 G capsule Take 1 g by mouth daily as needed (for vitamin).     [provider]  potassium chloride SA (K-DUR,KLOR-CON) 20 MEQ tablet Take 1 tablet (20 mEq total) by mouth daily. 09/22/16   Tereasa Coop, PA-C  rosuvastatin (CRESTOR) 20 MG tablet Take 1 tablet (20 mg total) by mouth daily at 6 PM. 10/29/16   Patwardhan, Manish J, MD  sacubitril-valsartan (ENTRESTO) 49-51 MG Take 1 tablet 2 (two) times daily by mouth.    [provider]  zolpidem (AMBIEN) 5 MG tablet Take 5 mg by mouth at bedtime. 09/04/16   [provider]    Allergies: No Known Allergies  Social History   Socioeconomic History  . Marital status: Married    Spouse name: Not on file  . Number of children: Not on file  . Years of education: Not on file  . Highest education level: Not on file  Social Needs  . Financial resource strain: Not on file  . Food insecurity - worry: Not on file  . Food insecurity - inability: Not on file  . Transportation needs - medical: Not on file  . Transportation needs - non-medical: Not on file  Occupational History  . Not on file  Tobacco Use  . Smoking status: Former Smoker    Packs/day: 1.00    Years: 12.00    Pack years: 12.00    Types: Cigarettes, Cigars    Last attempt to quit: 02/16/2011    Years since quitting: 6.0  . Smokeless tobacco: Former Systems developer    Types: Snuff  . Tobacco comment: 10/28/2016 'chewed for a couple years after I quit smoking"  Substance and Sexual Activity  . Alcohol use: No    Comment: none since 9/11  . Drug use: No  . Sexual activity: Not Currently  Other Topics Concern  . Not on file  Social History Narrative  . Not on file     No family history on file.   ROS:  Please see the history of present illness.     All other systems reviewed and negative.    Physical Exam:  Blood pressure 108/60, pulse 68, height 5' 10"  (1.778 m), weight 194 lb (88 kg). General: Well developed, well nourished male in no acute distress. Head: Normocephalic, atraumatic, sclera non-icteric, no xanthomas, nares  are without discharge. EENT: normal  Lymph Nodes:  none Neck: Negative for carotid bruits. JVD not elevated. Back:without scoliosis kyphosis Lungs: Clear bilaterally to auscultation without wheezes, rales, or rhonchi. Breathing is unlabored. Heart: RRR with S1 S2.  2/6 systolic murmur at the apex murmur . No rubs, or gallops appreciated. Abdomen: Soft, non-tender, non-distended with normoactive bowel sounds. No hepatomegaly. No rebound/guarding. No obvious abdominal masses. Msk:  Strength and tone appear normal for age. Extremities: No clubbing or cyanosis. No edema.  Distal pedal pulses are 2+ and equal bilaterally. Skin: Warm and Dry Neuro: Alert and oriented X 3. CN III-XII intact Grossly normal sensory and motor function .  Psych:  Responds to questions appropriately with a normal affect.      Labs: Cardiac Enzymes No results for input(s): CKTOTAL, CKMB, TROPONINI in the last 72 hours. CBC Lab Results  Component Value Date   WBC 6.4 10/29/2016   HGB 15.3 10/29/2016   HCT 45.7 10/29/2016   MCV 85.9 10/29/2016   PLT 150 10/29/2016   PROTIME: No results for input(s): LABPROT, INR in the last 72 hours. Chemistry No results for input(s): NA, K, CL, CO2, BUN, CREATININE, CALCIUM, PROT, BILITOT, ALKPHOS, ALT, AST, GLUCOSE in the last 168 hours.  Invalid input(s): LABALBU Lipids Lab Results  Component Value Date   CHOL 156 09/25/2016   HDL 26 (L) 09/25/2016   LDLCALC 89 09/25/2016   TRIG 205 (H) 09/25/2016   BNP No results found for: PROBNP Thyroid Function Tests: No results for input(s): TSH, T4TOTAL, T3FREE, THYROIDAB in the last 72 hours.  Invalid input(s): FREET3 Miscellaneous Lab Results  Component Value Date   DDIMER 0.92 (H) 09/25/2016    Radiology/Studies:  No results found.  EKG: Sinus rhythm at 68 Interval 16/18/47  Left bundle branch block    Assessment and Plan:   Nonischemic cardiomyopathy  Coronary artery disease status post DES  stenting  Congestive heart failure-chronic-systolic class IIb  Left bundle branch block  Mitral regurgitation    The patient has persistent left ventricular dysfunction despite guideline directed medical therapy.  He also has ongoing symptoms of congestive heart failure despite salt restriction and good medications.  He is appropriately considered for ICD implantation for primary prevention of sudden death as well as resynchronization to further attenuate sudden death risk as well as to potentially reverse LV dysfunction and improve functional status.  We have discussed extensively the physiology of cardiomyopathy its attendant risks.  We have further discussed the implications of left bundle branch block and the potential role of resynchronization  Perhaps also with structural changes in the LV geometry there may be reduction in his mitral regurgitation  Have reviewed the potential benefits and risks of ICD implantation including but not limited to death, perforation of heart or lung, lead dislodgement, infection,  device malfunction and inappropriate shocks.  The patient and family express understanding  and are willing to proceed.        Virl Axe

## 2017-02-26 ENCOUNTER — Telehealth (HOSPITAL_COMMUNITY): Payer: Self-pay | Admitting: Cardiac Rehabilitation

## 2017-02-26 ENCOUNTER — Encounter (HOSPITAL_COMMUNITY): Payer: Self-pay

## 2017-02-26 LAB — BASIC METABOLIC PANEL
BUN / CREAT RATIO: 15 (ref 9–20)
BUN: 15 mg/dL (ref 6–24)
CHLORIDE: 107 mmol/L — AB (ref 96–106)
CO2: 22 mmol/L (ref 20–29)
Calcium: 9.6 mg/dL (ref 8.7–10.2)
Creatinine, Ser: 1 mg/dL (ref 0.76–1.27)
GFR calc non Af Amer: 86 mL/min/{1.73_m2} (ref >59)
GFR, EST AFRICAN AMERICAN: 100 mL/min/{1.73_m2} (ref >59)
Glucose: 107 mg/dL — ABNORMAL HIGH (ref 65–99)
Potassium: 4.9 mmol/L (ref 3.5–5.2)
SODIUM: 142 mmol/L (ref 134–144)

## 2017-02-26 LAB — CBC WITH DIFFERENTIAL/PLATELET
BASOS: 0 %
Basophils Absolute: 0 10*3/uL (ref 0.0–0.2)
EOS (ABSOLUTE): 0.1 10*3/uL (ref 0.0–0.4)
Eos: 1 %
Hematocrit: 34.4 % — ABNORMAL LOW (ref 37.5–51.0)
Hemoglobin: 11.6 g/dL — ABNORMAL LOW (ref 13.0–17.7)
IMMATURE GRANS (ABS): 0.1 10*3/uL (ref 0.0–0.1)
Immature Granulocytes: 1 %
LYMPHS: 26 %
Lymphocytes Absolute: 1.8 10*3/uL (ref 0.7–3.1)
MCH: 29.7 pg (ref 26.6–33.0)
MCHC: 33.7 g/dL (ref 31.5–35.7)
MCV: 88 fL (ref 79–97)
MONOS ABS: 0.5 10*3/uL (ref 0.1–0.9)
Monocytes: 7 %
Neutrophils Absolute: 4.7 10*3/uL (ref 1.4–7.0)
Neutrophils: 65 %
PLATELETS: 218 10*3/uL (ref 150–379)
RBC: 3.9 x10E6/uL — ABNORMAL LOW (ref 4.14–5.80)
RDW: 14.1 % (ref 12.3–15.4)
WBC: 7.1 10*3/uL (ref 3.4–10.8)

## 2017-02-26 NOTE — Progress Notes (Signed)
Cardiac Individual Treatment Plan  Patient Details  Name: Miguel Pena MRN: [id number] Date of Birth: [birth year] Referring Provider:     CARDIAC REHAB PHASE II ORIENTATION from 11/18/2016 in [place]  Referring Provider  Vernell Leep, MD.      Initial Encounter Date:    CARDIAC REHAB PHASE II ORIENTATION from 11/18/2016 in [place]  Date  11/18/16  Referring Provider  Vernell Leep, MD.      Visit Diagnosis: Stented coronary artery  Patient's Home Medications on Admission:  Current Outpatient Medications:  .  aspirin 81 MG chewable tablet, Chew 1 tablet (81 mg total) by mouth daily., Disp: 90 tablet, Rfl: 3 .  bisoprolol (ZEBETA) 5 MG tablet, Take 1 tablet (5 mg total) by mouth daily., Disp: 30 tablet, Rfl: 1 .  clopidogrel (PLAVIX) 75 MG tablet, Take 1 tablet (75 mg total) by mouth daily., Disp: 90 tablet, Rfl: 2 .  furosemide (LASIX) 40 MG tablet, Take 1 tablet (40 mg total) by mouth as needed for edema., Disp: 30 tablet, Rfl: 0 .  HYDROcodone-acetaminophen (NORCO) 10-325 MG tablet, Take 1 tablet by mouth every 4 (four) hours as needed., Disp: , Rfl:  .  hydrOXYzine (VISTARIL) 100 MG capsule, Take 100 mg by mouth at bedtime., Disp: , Rfl:  .  nicotine polacrilex (NICORETTE) 2 MG gum, Take 2 mg by mouth as needed for smoking cessation., Disp: , Rfl:  .  omega-3 acid ethyl esters (LOVAZA) 1 G capsule, Take 1 g by mouth daily as needed (for vitamin). , Disp: , Rfl:  .  potassium chloride SA (K-DUR,KLOR-CON) 20 MEQ tablet, Take 1 tablet (20 mEq total) by mouth daily., Disp: 30 tablet, Rfl: 0 .  rosuvastatin (CRESTOR) 20 MG tablet, Take 1 tablet (20 mg total) by mouth daily at 6 PM., Disp: 30 tablet, Rfl: 3 .  sacubitril-valsartan (ENTRESTO) 49-51 MG, Take 1 tablet 2 (two) times daily by mouth., Disp: , Rfl:  .  spironolactone (ALDACTONE) 25 MG tablet, Take 25 mg by mouth daily., Disp: , Rfl: 0 .  zolpidem  (AMBIEN) 5 MG tablet, Take 5 mg by mouth at bedtime., Disp: , Rfl:   Past Medical History: Past Medical History:  Diagnosis Date  . Bursitis of right shoulder   . CHF (congestive heart failure) ([place])    [name] 10/24/2016  . Dilated cardiomyopathy ([place])    [name] 10/24/2016  . Dysrhythmia 1995   "VT" per patient- states had neg stress test and no problems since  . Heart murmur    "when I was young"  . History of kidney stones   . Hypercholesterolemia     Tobacco Use: Social History   Tobacco Use  Smoking Status Former Smoker  . Packs/day: 1.00  . Years: 12.00  . Pack years: 12.00  . Types: Cigarettes, Cigars  . Last attempt to quit: 02/16/2011  . Years since quitting: 6.0  Smokeless Tobacco Former Systems developer  . Types: Snuff  Tobacco Comment   10/28/2016 'chewed for a couple years after I quit smoking"    Labs: Recent Review Flowsheet Data    Labs for ITP Cardiac and Pulmonary Rehab Latest Ref Rng & Units 09/25/2016 09/25/2016 10/28/2016 10/28/2016   Cholestrol 100 - 199 mg/dL 154 156 - -   LDLCALC 0 - 99 mg/dL 87 89 - -   HDL >39 mg/dL 26(L) 26(L) - -   Trlycerides 0 - 149 mg/dL 203(H) 205(H) - -   Hemoglobin  A1c 4.8 - 5.6 % CANCELED 5.3 - -   PHART 7.350 - 7.450 - - - 7.430   PCO2ART 32.0 - 48.0 mmHg - - - 39.1   HCO3 20.0 - 28.0 mmol/L - - 24.9 25.9   TCO2 22 - 32 mmol/L - - 26 27   O2SAT % - - 64.0 95.0      Capillary Blood Glucose: No results found for: GLUCAP   Exercise Target Goals:    Exercise Program Goal: Individual exercise prescription set using results from initial 6 min walk test and THRR while considering  patient's activity barriers and safety.   Exercise Prescription Goal: Initial exercise prescription builds to 30-45 minutes a day of aerobic activity, 2-3 days per week.  Home exercise guidelines will be given to patient during program as part of exercise prescription that the participant will acknowledge.  Activity Barriers & Risk  Stratification: Activity Barriers & Cardiac Risk Stratification - 11/18/16 1548      Activity Barriers & Cardiac Risk Stratification   Activity Barriers  Other (comment)    Comments  Shoulder/ mid back pain.    Cardiac Risk Stratification  High       6 Minute Walk: 6 Minute Walk    Row Name 11/18/16 1547         6 Minute Walk   Phase  Initial     Distance  1488 feet     Walk Time  6 minutes     # of Rest Breaks  0     MPH  2.82     METS  4.47     RPE  11     VO2 Peak  15.65     Symptoms  No     Resting HR  95 bpm     Resting BP  122/60     Resting Oxygen Saturation   97 %     Exercise Oxygen Saturation  during 6 min walk  96 %     Max Ex. HR  119 bpm     Max Ex. BP  132/70     2 Minute Post BP  120/63        Oxygen Initial Assessment:   Oxygen Re-Evaluation:   Oxygen Discharge (Final Oxygen Re-Evaluation):   Initial Exercise Prescription: Initial Exercise Prescription - 11/18/16 1500      Date of Initial Exercise RX and Referring Provider   Date  11/18/16    Referring Provider  Vernell Leep, MD.      Treadmill   MPH  2.6    Grade  1    Minutes  10    METs  3.35      Bike   Level  1.6    Minutes  10    METs  4.55      NuStep   Level  4    SPM  85    Minutes  10    METs  3      Prescription Details   Frequency (times per week)  3    Duration  Progress to 30 minutes of continuous aerobic without signs/symptoms of physical distress      Intensity   THRR 40-80% of Max Heartrate  67-134    Ratings of Perceived Exertion  11-13    Perceived Dyspnea  0-4      Progression   Progression  Continue to progress workloads to maintain intensity without signs/symptoms of physical distress.      Resistance  Training   Training Prescription  Yes    Weight  4lbs    Reps  10-15       Perform Capillary Blood Glucose checks as needed.  Exercise Prescription Changes: Exercise Prescription Changes    Row Name 11/21/16 1603 12/03/16 1559 12/12/16  1700 12/24/16 1600 01/09/17 1254     Response to Exercise   Blood Pressure (Admit)  112/80  120/70  98/59  106/65  118/70   Blood Pressure (Exercise)  132/70  118/72  146/76  126/74  126/60   Blood Pressure (Exit)  114/70  100/80  94/61  118/60  106/64   Heart Rate (Admit)  76 bpm  84 bpm  79 bpm  95 bpm  75 bpm   Heart Rate (Exercise)  111 bpm  116 bpm  118 bpm  125 bpm  116 bpm   Heart Rate (Exit)  66 bpm  80 bpm  76 bpm  75 bpm  80 bpm   Rating of Perceived Exertion (Exercise)  12  12  13  14  14    Symptoms  pt oriented to exercise equipment  none  none  none  none   Duration  Continue with 30 min of aerobic exercise without signs/symptoms of physical distress.  Continue with 30 min of aerobic exercise without signs/symptoms of physical distress.  Continue with 30 min of aerobic exercise without signs/symptoms of physical distress.  Continue with 30 min of aerobic exercise without signs/symptoms of physical distress.  Continue with 30 min of aerobic exercise without signs/symptoms of physical distress.   Intensity  THRR unchanged  THRR unchanged  THRR unchanged  THRR unchanged  THRR unchanged     Progression   Progression  Continue to progress workloads to maintain intensity without signs/symptoms of physical distress.  Continue to progress workloads to maintain intensity without signs/symptoms of physical distress.  Continue to progress workloads to maintain intensity without signs/symptoms of physical distress.  Continue to progress workloads to maintain intensity without signs/symptoms of physical distress.  Continue to progress workloads to maintain intensity without signs/symptoms of physical distress.   Average METs  2.9  4  4.7  4.8  4.5     Resistance Training   Training Prescription  Yes  Yes  Yes  No  Yes   Weight  4lbs  4lbs  4lbs  -  5lbs   Reps  10-15  10-15  10-15  -  10-15   Time  10 Minutes  10 Minutes  10 Minutes  -  10 Minutes     Treadmill   MPH  2.6  2.7  -  -  3    Grade  1  2  -  -  2   Minutes  15  15  -  -  15   METs  3.35  3.81  -  -  4.12     Bike   Level  -  -  1.6  1.8  1.8   Minutes  -  -  15  15  15    METs  -  -  4.53  4.95  4.95     NuStep   Level  4  4  5  5   -   SPM  85  85  85  85  -   Minutes  15  15  15  15   -   METs  2.4  4.1  4.9  4.8  -  Home Exercise Plan   Plans to continue exercise at  -  -  Home (comment) Walking  Home (comment) Walking   Home (comment) Walking    Frequency  -  -  Add 3 additional days to program exercise sessions.  Add 3 additional days to program exercise sessions.  Add 3 additional days to program exercise sessions.   Initial Home Exercises Provided  -  -  12/05/16  12/05/16  12/05/16   Row Name 01/23/17 1029 02/13/17 1611           Response to Exercise   Blood Pressure (Admit)  96/54  112/64      Blood Pressure (Exercise)  118/50  104/52      Blood Pressure (Exit)  94/52 asymptomatic  100/63      Heart Rate (Admit)  76 bpm  71 bpm      Heart Rate (Exercise)  123 bpm  117 bpm      Heart Rate (Exit)  76 bpm  80 bpm      Rating of Perceived Exertion (Exercise)  16  12      Symptoms  none  none      Duration  Continue with 30 min of aerobic exercise without signs/symptoms of physical distress.  Continue with 30 min of aerobic exercise without signs/symptoms of physical distress.      Intensity  THRR unchanged  THRR unchanged        Progression   Progression  Continue to progress workloads to maintain intensity without signs/symptoms of physical distress.  Continue to progress workloads to maintain intensity without signs/symptoms of physical distress.      Average METs  4.8  4.5        Resistance Training   Training Prescription  Yes  Yes      Weight  5lb  5lb      Reps  10-15  10-15      Time  10 Minutes  10 Minutes        Treadmill   MPH  3  3.2      Grade  2  2      Minutes  15  15      METs  4.12  4.33        Bike   Level  -  -      Minutes  -  -      METs  -  -        NuStep    Level  5  -      SPM  85  -      Minutes  15  -      METs  4.5  -        Rower   Level  -  3      Watts  -  27      Minutes  -  15      METs  -  4.7        Home Exercise Plan   Plans to continue exercise at  Home (comment) Walking   Home (comment) Walking       Frequency  Add 3 additional days to program exercise sessions.  Add 3 additional days to program exercise sessions.      Initial Home Exercises Provided  12/05/16  12/05/16         Exercise Comments: Exercise Comments    Row Name 11/21/16 1602 12/24/16 1635 01/28/17 1257 02/24/17 1613  Exercise Comments  Pt was oriented to exercise equipment on 11/21/16. Pt did well with first exercise session.  Pt is responding well to exercise prescription.  Will continue to monitor and progress well.   Reviewed METs and goals. Pt is tolerating exercise prescription very well without compliants of CP, dizziness or SOB. Will continue to progress activity levels as tolerated  Reviewed METs and goals. Pt is tolerating exercise prescription very well without compliants of CP, dizziness or SOB. Will continue to progress activity levels as tolerated       Exercise Goals and Review: Exercise Goals    Row Name 11/18/16 1551             Exercise Goals   Increase Physical Activity  Yes       Intervention  Provide advice, education, support and counseling about physical activity/exercise needs.;Develop an individualized exercise prescription for aerobic and resistive training based on initial evaluation findings, risk stratification, comorbidities and participant's personal goals.       Expected Outcomes  Achievement of increased cardiorespiratory fitness and enhanced flexibility, muscular endurance and strength shown through measurements of functional capacity and personal statement of participant.       Increase Strength and Stamina  Yes       Intervention  Provide advice, education, support and counseling about physical activity/exercise  needs.;Develop an individualized exercise prescription for aerobic and resistive training based on initial evaluation findings, risk stratification, comorbidities and participant's personal goals.       Expected Outcomes  Achievement of increased cardiorespiratory fitness and enhanced flexibility, muscular endurance and strength shown through measurements of functional capacity and personal statement of participant.       Able to understand and use rate of perceived exertion (RPE) scale  Yes       Intervention  Provide education and explanation on how to use RPE scale       Expected Outcomes  Short Term: Able to use RPE daily in rehab to express subjective intensity level;Long Term:  Able to use RPE to guide intensity level when exercising independently       Knowledge and understanding of Target Heart Rate Range (THRR)  Yes       Intervention  Provide education and explanation of THRR including how the numbers were predicted and where they are located for reference       Expected Outcomes  Short Term: Able to state/look up THRR;Long Term: Able to use THRR to govern intensity when exercising independently;Short Term: Able to use daily as guideline for intensity in rehab       Able to check pulse independently  Yes       Intervention  Provide education and demonstration on how to check pulse in carotid and radial arteries.;Review the importance of being able to check your own pulse for safety during independent exercise       Expected Outcomes  Long Term: Able to check pulse independently and accurately;Short Term: Able to explain why pulse checking is important during independent exercise       Understanding of Exercise Prescription  Yes       Intervention  Provide education, explanation, and written materials on patient's individual exercise prescription       Expected Outcomes  Short Term: Able to explain program exercise prescription;Long Term: Able to explain home exercise prescription to exercise  independently          Exercise Goals Re-Evaluation : Exercise Goals Re-Evaluation    Row Name 12/04/16  1606 12/24/16 1638 01/28/17 1255 02/25/17 1615       Exercise Goal Re-Evaluation   Exercise Goals Review  Increase Physical Activity;Understanding of Exercise Prescription;Increase Strength and Stamina;Knowledge and understanding of Target Heart Rate Range (THRR);Able to understand and use rate of perceived exertion (RPE) scale;Able to check pulse independently  Increase Physical Activity  Increase Physical Activity;Understanding of Exercise Prescription;Increase Strength and Stamina;Knowledge and understanding of Target Heart Rate Range (THRR);Able to understand and use rate of perceived exertion (RPE) scale;Able to check pulse independently  Increase Physical Activity;Understanding of Exercise Prescription;Increase Strength and Stamina;Knowledge and understanding of Target Heart Rate Range (THRR);Able to understand and use rate of perceived exertion (RPE) scale;Able to check pulse independently    Comments  Pt tolerates WL increases very well; pt demonstrates proper use of RPE scale.  Pt tolerates WL increases very well; pt demonstrates proper use of RPE scale.  Pt is making great progress in cardiac rehab and tolerates WL increases very well. Pt stated "feeling better" and "having more energy." Pt is also compliant with home exercise.  Pt was oriented to rower machine on 02/13/17 to assist with improve UE, core and back muscles.     Expected Outcomes  Pt will exercise for 30 minutes without difficulty to improve cardiorespiratory fitness.   Pt will continue to exercise 3x a week in cardiac rehab, in addition to walking at home 2-3 days for 30 minutes.   Pt will continue to exercise 3x a week in cardiac rehab, in addition to walking at home 2-3 days for 30 minutes.   Pt will continue to exercise safely in cardiac rehab and build on aerobic capacity.        Discharge Exercise Prescription (Final  Exercise Prescription Changes): Exercise Prescription Changes - 02/13/17 1611      Response to Exercise   Blood Pressure (Admit)  112/64    Blood Pressure (Exercise)  104/52    Blood Pressure (Exit)  100/63    Heart Rate (Admit)  71 bpm    Heart Rate (Exercise)  117 bpm    Heart Rate (Exit)  80 bpm    Rating of Perceived Exertion (Exercise)  12    Symptoms  none    Duration  Continue with 30 min of aerobic exercise without signs/symptoms of physical distress.    Intensity  THRR unchanged      Progression   Progression  Continue to progress workloads to maintain intensity without signs/symptoms of physical distress.    Average METs  4.5      Resistance Training   Training Prescription  Yes    Weight  5lb    Reps  10-15    Time  10 Minutes      Treadmill   MPH  3.2    Grade  2    Minutes  15    METs  4.33      Rower   Level  3    Watts  27    Minutes  15    METs  4.7      Home Exercise Plan   Plans to continue exercise at  Home (comment) Walking     Frequency  Add 3 additional days to program exercise sessions.    Initial Home Exercises Provided  12/05/16       Nutrition:  Target Goals: Understanding of nutrition guidelines, daily intake of sodium <1537m, cholesterol <2073m calories 30% from fat and 7% or less from saturated fats, daily to have 5  or more servings of fruits and vegetables.  Biometrics: Pre Biometrics - 11/18/16 1552      Pre Biometrics   Height  5' 10"  (1.778 m)    Weight  189 lb 13.1 oz (86.1 kg)    Waist Circumference  39 inches    Hip Circumference  39.75 inches    Waist to Hip Ratio  0.98 %    BMI (Calculated)  27.24    Triceps Skinfold  14 mm    % Body Fat  25.9 %    Grip Strength  40.5 kg    Flexibility  11 in    Single Leg Stand  5.5 seconds        Nutrition Therapy Plan and Nutrition Goals:   Nutrition Assessments:   Nutrition Goals Re-Evaluation:   Nutrition Goals Re-Evaluation:   Nutrition Goals Discharge (Final  Nutrition Goals Re-Evaluation):   Psychosocial: Target Goals: Acknowledge presence or absence of significant depression and/or stress, maximize coping skills, provide positive support system. Participant is able to verbalize types and ability to use techniques and skills needed for reducing stress and depression.  Initial Review & Psychosocial Screening: Initial Psych Review & Screening - 11/18/16 1610      Initial Review   Current issues with  None Identified      Family Dynamics   Good Support System?  Yes wife     Comments  upon brief assessment, no psychosocial needs identified, no interventions necessary. pt does exhibit health related anxiety       Barriers   Psychosocial barriers to participate in program  There are no identifiable barriers or psychosocial needs.      Screening Interventions   Interventions  Encouraged to exercise;Provide feedback about the scores to participant       Quality of Life Scores: Quality of Life - 01/23/17 1635      Quality of Life Scores   Health/Function Pre  26.8 %    Socioeconomic Pre  27.43 %    Psych/Spiritual Pre  30 %    Family Pre  28.8 %    GLOBAL Pre  27.88 % overall pt demonstrates positive outlook with good coping skills.       Scores of 19 and below usually indicate a poorer quality of life in these areas.  A difference of  2-3 points is a clinically meaningful difference.  A difference of 2-3 points in the total score of the Quality of Life Index has been associated with significant improvement in overall quality of life, self-image, physical symptoms, and general health in studies assessing change in quality of life.  PHQ-9: Recent Review Flowsheet Data    Depression screen [facility] 2/9 11/21/2016 09/30/2016 09/25/2016 09/22/2016   Decreased Interest 0 0 0 0   Down, Depressed, Hopeless 0 0 0 0   PHQ - 2 Score 0 0 0 0     Interpretation of Total Score  Total Score Depression Severity:  1-4 = Minimal depression, 5-9 = Mild  depression, 10-14 = Moderate depression, 15-19 = Moderately severe depression, 20-27 = Severe depression   Psychosocial Evaluation and Intervention: Psychosocial Evaluation - 11/21/16 1444      Psychosocial Evaluation & Interventions   Interventions  Encouraged to exercise with the program and follow exercise prescription    Comments  no psyschosocial needs identified, no interventions necessary     Expected Outcomes  pt will exhibit positive outlook with good coping skills.     Continue Psychosocial Services   No  Follow up required       Psychosocial Re-Evaluation: Psychosocial Re-Evaluation    Row Name 12/05/16 1710 01/02/17 1233 01/23/17 1634 02/26/17 1212       Psychosocial Re-Evaluation   Current issues with  None Identified  None Identified  None Identified  None Identified    Comments  no psychosocial needs identified, no interventions necessary.   no psychosocial needs identified, no interventions necessary.   no psychosocial needs identified, no interventions necessary. pt excited about new hobby [place].  He is pleased it will be relaxing activity that includes physical activity.   no psychosocial needs identified, no interventions necessary.      Expected Outcomes  pt will exhibit positive outlook with good coping skills.   pt will exhibit positive outlook with good coping skills.   pt will exhibit positive outlook with good coping skills.   pt will exhibit positive outlook with good coping skills.     Interventions  Encouraged to attend Cardiac Rehabilitation for the exercise;Stress management education;Relaxation education  Encouraged to attend Cardiac Rehabilitation for the exercise;Stress management education;Relaxation education  Encouraged to attend Cardiac Rehabilitation for the exercise;Stress management education;Relaxation education  Encouraged to attend Cardiac Rehabilitation for the exercise;Stress management education;Relaxation education    Continue Psychosocial  Services   No Follow up required  No Follow up required  No Follow up required  No Follow up required       Psychosocial Discharge (Final Psychosocial Re-Evaluation): Psychosocial Re-Evaluation - 02/26/17 1212      Psychosocial Re-Evaluation   Current issues with  None Identified    Comments  no psychosocial needs identified, no interventions necessary.      Expected Outcomes  pt will exhibit positive outlook with good coping skills.     Interventions  Encouraged to attend Cardiac Rehabilitation for the exercise;Stress management education;Relaxation education    Continue Psychosocial Services   No Follow up required       Vocational Rehabilitation: Provide vocational rehab assistance to qualifying candidates.   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 Participant will state understanding/return demonstration of topics presented.  Learning Barriers/Preferences:   Education Topics: Count Your Pulse:  -Group instruction provided by verbal instruction, demonstration, patient participation and written materials to support subject.  Instructors address importance of being able to find your pulse and how to count your pulse when at home without a heart monitor.  Patients get hands on experience counting their pulse with staff help and individually.   CARDIAC REHAB PHASE II EXERCISE from 02/06/2017 in [place]  Date  01/30/17  Educator  RN      Heart Attack, Angina, and Risk Factor Modification:  -Group instruction provided by verbal instruction, video, and written materials to support subject.  Instructors address signs and symptoms of angina and heart attacks.     Also discuss risk factors for heart disease and how to make changes to improve heart health risk factors.   CARDIAC REHAB PHASE II EXERCISE from 02/06/2017 in [place]  Date  01/07/17      Functional Fitness:  -Group instruction provided by verbal instruction, demonstration, patient participation, and written materials to support subject.  Instructors address safety measures for doing things around the house.  Discuss how to get up and down off the floor, how to pick things up properly, how to safely get out of a chair without assistance, and balance training.   Meditation and Mindfulness:  -Group instruction provided by verbal instruction, patient participation, and written materials to support subject.  Instructor addresses importance of mindfulness and meditation practice to help reduce stress and improve awareness.  Instructor also leads participants through a meditation exercise.    Stretching for Flexibility and Mobility:  -Group instruction provided by verbal instruction, patient participation, and written materials to support subject.  Instructors lead participants through series of stretches that are designed to increase flexibility thus improving mobility.  These stretches are additional exercise for major muscle groups that are typically performed during regular warm up and cool down.   Hands Only CPR:  -Group verbal, video, and participation provides a basic overview of AHA guidelines for community CPR. Role-play of emergencies allow participants the opportunity to practice calling for help and chest compression technique with discussion of AED use.   CARDIAC REHAB PHASE II EXERCISE from 02/06/2017 in [place]  Date  02/06/17  Instruction Review Code  2- Demonstrated Understanding      Hypertension: -Group verbal and written instruction that provides a basic overview of hypertension including the most recent  diagnostic guidelines, risk factor reduction with self-care instructions and medication management.    Nutrition I class: Heart Healthy Eating:  -Group instruction provided by PowerPoint slides, verbal discussion, and written materials to support subject matter. The instructor gives an explanation and review of the Therapeutic Lifestyle Changes diet recommendations, which includes a discussion on lipid goals, dietary fat, sodium, fiber, plant stanol/sterol esters, sugar, and the components of a well-balanced, healthy diet.   CARDIAC REHAB PHASE II EXERCISE from 02/06/2017 in [place]  Date  01/02/17  Educator  RD      Nutrition II class: Lifestyle Skills:  -Group instruction provided by PowerPoint slides, verbal discussion, and written materials to support subject matter. The instructor gives an explanation and review of label reading, grocery shopping for heart health, heart healthy recipe modifications, and ways to make healthier choices when eating out.   CARDIAC REHAB PHASE II EXERCISE from 02/06/2017 in [place]  Date  01/02/17  Educator  RD      Diabetes Question & Answer:  -Group instruction provided by PowerPoint slides, verbal discussion, and written materials to support subject matter. The instructor gives an explanation and review of diabetes co-morbidities, pre- and post-prandial blood glucose goals, pre-exercise blood glucose goals, signs, symptoms, and treatment of hypoglycemia and hyperglycemia, and foot care basics.   CARDIAC REHAB PHASE II EXERCISE from 02/06/2017 in [place]  Date  01/23/17  Educator  RD      Diabetes Blitz:  -Group instruction provided by PowerPoint slides, verbal discussion, and written materials to support subject matter. The instructor gives an explanation and review of the physiology behind type 1 and type 2 diabetes, diabetes medications and rational  behind using different medications, pre- and post-prandial blood glucose recommendations and Hemoglobin A1c goals, diabetes diet, and exercise including blood glucose guidelines for exercising safely.    Portion Distortion:  -Group instruction provided by PowerPoint slides, verbal discussion, written materials, and food models to support subject matter. The instructor gives an explanation of serving size versus portion size, changes  in portions sizes over the last 20 years, and what consists of a serving from each food group.   CARDIAC REHAB PHASE II EXERCISE from 02/06/2017 in [place]  Date  12/24/16  Educator  RD      Stress Management:  -Group instruction provided by verbal instruction, video, and written materials to support subject matter.  Instructors review role of stress in heart disease and how to cope with stress positively.     Exercising on Your Own:  -Group instruction provided by verbal instruction, power point, and written materials to support subject.  Instructors discuss benefits of exercise, components of exercise, frequency and intensity of exercise, and end points for exercise.  Also discuss use of nitroglycerin and activating EMS.  Review options of places to exercise outside of rehab.  Review guidelines for sex with heart disease.   CARDIAC REHAB PHASE II EXERCISE from 02/06/2017 in [place]  Date  01/21/17  Educator  EP      Cardiac Drugs I:  -Group instruction provided by verbal instruction and written materials to support subject.  Instructor reviews cardiac drug classes: antiplatelets, anticoagulants, beta blockers, and statins.  Instructor discusses reasons, side effects, and lifestyle considerations for each drug class.   Cardiac Drugs II:  -Group instruction provided by verbal instruction and written materials to support subject.  Instructor reviews cardiac drug classes: angiotensin converting  enzyme inhibitors (ACE-I), angiotensin II receptor blockers (ARBs), nitrates, and calcium channel blockers.  Instructor discusses reasons, side effects, and lifestyle considerations for each drug class.   CARDIAC REHAB PHASE II EXERCISE from 02/06/2017 in [place]  Date  12/17/16      Anatomy and Physiology of the Circulatory System:  Group verbal and written instruction and models provide basic cardiac anatomy and physiology, with the coronary electrical and arterial systems. Review of: AMI, Angina, Valve disease, Heart Failure, Peripheral Artery Disease, Cardiac Arrhythmia, Pacemakers, and the ICD.   CARDIAC REHAB PHASE II EXERCISE from 02/06/2017 in [place]  Date  12/03/16      Other Education:  -Group or individual verbal, written, or video instructions that support the educational goals of the cardiac rehab program.   Holiday Eating Survival Tips:  -Group instruction provided by PowerPoint slides, verbal discussion, and written materials to support subject matter. The instructor gives patients tips, tricks, and techniques to help them not only survive but enjoy the holidays despite the onslaught of food that accompanies the holidays.   Knowledge Questionnaire Score: Knowledge Questionnaire Score - 11/18/16 1553      Knowledge Questionnaire Score   Pre Score  22/24       Core Components/Risk Factors/Patient Goals at Admission: Personal Goals and Risk Factors at Admission - 11/18/16 1441      Core Components/Risk Factors/Patient Goals on Admission    Weight Management  Yes;Weight Maintenance    Intervention  Weight Management: Develop a combined nutrition and exercise program designed to reach desired caloric intake, while maintaining appropriate intake of nutrient and fiber, sodium and fats, and appropriate energy expenditure required for the weight goal.;Weight Management: Provide education and appropriate  resources to help participant work on and attain dietary goals.;Weight Management/Obesity: Establish reasonable short term and long term weight goals.    Expected Outcomes  Short Term: Continue to assess and modify interventions until short term weight is achieved;Weight Maintenance: Understanding of the daily nutrition guidelines, which includes 25-35% calories  from fat, 7% or less cal from saturated fats, less than 257m cholesterol, less than 1.5gm of sodium, & 5 or more servings of fruits and vegetables daily       Core Components/Risk Factors/Patient Goals Review:  Goals and Risk Factor Review    Row Name 12/05/16 1708 01/02/17 1233 01/23/17 1633 02/26/17 1211       Core Components/Risk Factors/Patient Goals Review   Personal Goals Review  Other;Weight Management/Obesity  Other;Weight Management/Obesity  Other;Weight Management/Obesity  Other;Weight Management/Obesity    Review  pt with few CAD RF demonstrates eagerness to participate in CR opportunities.   pt with few CAD RF demonstrates eagerness to participate in CR opportunities.   pt with few CAD RF demonstrates eagerness to participate in CR opportunities. pt looking forward to repeat ECHO with hope for mproved results.    pt plans to complete CR program in a few more sessions. pt would like to complete program prior to scheduled ICD implant.      Expected Outcomes  pt will participate in CR exercise, nutrition and lifestyle modification to prevent disease progression.   pt will participate in CR exercise, nutrition and lifestyle modification to prevent disease progression.   pt will participate in CR exercise, nutrition and lifestyle modification to prevent disease progression.   pt will participate in CR exercise, nutrition and lifestyle modification to prevent disease progression.        Core Components/Risk Factors/Patient Goals at Discharge (Final Review):  Goals and Risk Factor Review - 02/26/17 1211      Core Components/Risk  Factors/Patient Goals Review   Personal Goals Review  Other;Weight Management/Obesity    Review  pt plans to complete CR program in a few more sessions. pt would like to complete program prior to scheduled ICD implant.      Expected Outcomes  pt will participate in CR exercise, nutrition and lifestyle modification to prevent disease progression.        ITP Comments: ITP Comments    Row Name 11/18/16 1547 12/05/16 1708 01/02/17 1233 01/23/17 1632 02/26/17 1211   ITP Comments  Medical Director- Dr. TFransico Him MD.  30 day ITP review.  pt with good attendance and participation.   30 day ITP review.  pt with good attendance and participation.   30 day ITP review.  pt with good attendance and participation.   30 day ITP review.  pt with good attendance and participation.       Comments:

## 2017-02-26 NOTE — Telephone Encounter (Signed)
pc to pt to discuss reason for recent absence from cardiac rehab. Pt reports frequent doctor appointments, including EP consult with scheduled BIVICD implant.   Pt plans to return to exercise and complete CR prior to 03/11/2017 implant appointment. Andi Hence, RN, BSN Cardiac Pulmonary Rehab

## 2017-02-27 ENCOUNTER — Encounter (HOSPITAL_COMMUNITY)
Admission: RE | Admit: 2017-02-27 | Discharge: 2017-02-27 | Disposition: A | Discharge status: Home / Self Care | Payer: 59 | Source: Ambulatory Visit | Attending: Cardiology | Admitting: Cardiology

## 2017-02-27 VITALS — Ht 70.0 in | Wt 192.0 lb

## 2017-02-27 DIAGNOSIS — I509 Heart failure, unspecified: Secondary | ICD-10-CM | POA: Diagnosis not present

## 2017-02-27 DIAGNOSIS — Z955 Presence of coronary angioplasty implant and graft: Secondary | ICD-10-CM

## 2017-03-02 ENCOUNTER — Ambulatory Visit: Payer: 59 | Admitting: Physician Assistant

## 2017-03-02 ENCOUNTER — Encounter: Payer: Self-pay | Admitting: Physician Assistant

## 2017-03-02 ENCOUNTER — Telehealth: Payer: Self-pay | Admitting: Internal Medicine

## 2017-03-02 VITALS — BP 94/60 | HR 64 | Ht 70.0 in | Wt 194.5 lb

## 2017-03-02 DIAGNOSIS — R6881 Early satiety: Secondary | ICD-10-CM

## 2017-03-02 DIAGNOSIS — D649 Anemia, unspecified: Secondary | ICD-10-CM | POA: Diagnosis not present

## 2017-03-02 DIAGNOSIS — K921 Melena: Secondary | ICD-10-CM

## 2017-03-02 DIAGNOSIS — Z7901 Long term (current) use of anticoagulants: Secondary | ICD-10-CM | POA: Diagnosis not present

## 2017-03-02 DIAGNOSIS — K219 Gastro-esophageal reflux disease without esophagitis: Secondary | ICD-10-CM | POA: Diagnosis not present

## 2017-03-02 MED ORDER — NA SULFATE-K SULFATE-MG SULF 17.5-3.13-1.6 GM/177ML PO SOLN: 1.0000 | ORAL | 0 refills | Status: DC

## 2017-03-02 NOTE — Progress Notes (Addendum)
Chief Complaint: Anemia  HPI:    [name] is a 52 y/o Caucasian male with a past medical history of CHF (last documented EF October 26 1823%), recent drug-eluding stent placement November 01, 2016, maintained on Plavix and others listed below, who presents to clinic today as a referral from Dr. [name] for a complaint of new anemia.    Per review of chart Dr. [name] would appreciate if our workup including EGD and colonoscopy could occur prior to patient's upcoming pacemaker placement scheduled for March 6.    Recent labs 02/25/17 show a hemoglobin of 11.6, compared to 10/29/16 at 15.3.  Patient was scheduled as a new patient with Dr. Hilarie Fredrickson 03/31/17 but called for sooner appointment.    Today, has multiple GI complaints including bright red blood with his bowel movements for the past year. Describes having some bright red blood on the toilet paper after wiping even without a bowel movement.  Sometimes "the blood can fill the toilet bowl".  History of polyps in his dad and brother, unsure how many.    Also, right-sided "swelling" typically worse after eating, though "I cannot eat that much anymore because I get full".  Feels full very fast as well as new onset of heartburn and some trouble swallowing, associated symptoms of bloating and gas.   Due to get a pacemaker placed in early March for his continued heart failure irregardless of physical therapy.   Denies fever, chills, weight loss, nausea, vomiting or symptoms that awaken him at night.  Past Medical History:  Diagnosis Date  . Bursitis of right shoulder   . CHF (congestive heart failure) ([place])    [name] 10/24/2016  . Dilated cardiomyopathy ([place])    [name] 10/24/2016  . Dysrhythmia 1995   "VT" per patient- states had neg stress test and no problems since  . Heart murmur    "when I was young"  . History of kidney stones   . Hypercholesterolemia     Past Surgical History:  Procedure Laterality Date  . CORONARY ANGIOPLASTY WITH STENT  PLACEMENT  10/28/2016  . CORONARY STENT INTERVENTION N/A 10/28/2016   Procedure: CORONARY STENT INTERVENTION;  Surgeon: [name], MD;  Location: [facility];  Service: Cardiovascular;  Laterality: N/A;  . INGUINAL HERNIA REPAIR Right 02/16/2013   Procedure: RIGHT INGUINAL HERNIA REPAIR ;  Surgeon: [name], MD;  Location: [facility];  Service: General;  Laterality: Right;  With MESH  . INGUINAL HERNIA REPAIR Right 02/16/2013  . INTRAVASCULAR PRESSURE WIRE/FFR STUDY N/A 10/28/2016   Procedure: INTRAVASCULAR PRESSURE WIRE/FFR STUDY;  Surgeon: [name], MD;  Location: [facility];  Service: Cardiovascular;  Laterality: N/A;  . RIGHT/LEFT HEART CATH AND CORONARY ANGIOGRAPHY N/A 10/28/2016   Procedure: RIGHT/LEFT HEART CATH AND CORONARY ANGIOGRAPHY;  Surgeon: [name], MD;  Location: [facility];  Service: Cardiovascular;  Laterality: N/A;  . ULTRASOUND GUIDANCE FOR VASCULAR ACCESS  10/28/2016   Procedure: Ultrasound Guidance For Vascular Access;  Surgeon: [name], MD;  Location: [facility];  Service: Cardiovascular;;    Current Outpatient Medications  Medication Sig Dispense Refill  . aspirin 81 MG chewable tablet Chew 1 tablet (81 mg total) by mouth daily. 90 tablet 3  . bisoprolol (ZEBETA) 5 MG tablet Take 1 tablet (5 mg total) by mouth daily. 30 tablet 1  . clopidogrel (PLAVIX) 75 MG tablet Take 1 tablet (75 mg total) by mouth daily. 90 tablet 2  .  furosemide (LASIX) 40 MG tablet Take 1 tablet (40 mg total) by mouth as needed for edema. 30 tablet 0  . HYDROcodone-acetaminophen (NORCO) 10-325 MG tablet Take 1 tablet by mouth every 4 (four) hours as needed.    . hydrOXYzine (VISTARIL) 100 MG capsule Take 100 mg by mouth at bedtime.    . nicotine polacrilex (NICORETTE) 2 MG gum Take 2 mg by mouth as needed for smoking cessation.    Marland Kitchen omega-3 acid ethyl esters (LOVAZA) 1 G capsule Take 1 g by mouth daily as needed (for  vitamin).     . potassium chloride SA (K-DUR,KLOR-CON) 20 MEQ tablet Take 1 tablet (20 mEq total) by mouth daily. 30 tablet 0  . rosuvastatin (CRESTOR) 20 MG tablet Take 1 tablet (20 mg total) by mouth daily at 6 PM. 30 tablet 3  . sacubitril-valsartan (ENTRESTO) 49-51 MG Take 1 tablet 2 (two) times daily by mouth.    . sacubitril-valsartan (ENTRESTO) 97-103 MG Take 1 tablet by mouth 2 (two) times daily.    Marland Kitchen zolpidem (AMBIEN) 5 MG tablet Take 5 mg by mouth at bedtime.    . Na Sulfate-K Sulfate-Mg Sulf 17.5-3.13-1.6 GM/177ML SOLN Take 1 kit by mouth as directed. 354 mL 0   No current facility-administered medications for this visit.     Allergies as of 03/02/2017  . (No Known Allergies)    History reviewed. No pertinent family history.  Social History   Socioeconomic History  . Marital status: Married    Spouse name: Not on file  . Number of children: Not on file  . Years of education: Not on file  . Highest education level: Not on file  Social Needs  . Financial resource strain: Not on file  . Food insecurity - worry: Not on file  . Food insecurity - inability: Not on file  . Transportation needs - medical: Not on file  . Transportation needs - non-medical: Not on file  Occupational History  . Not on file  Tobacco Use  . Smoking status: Former Smoker    Packs/day: 1.00    Years: 12.00    Pack years: 12.00    Types: Cigarettes, Cigars    Last attempt to quit: 02/16/2011    Years since quitting: 6.0  . Smokeless tobacco: Former Systems developer    Types: Snuff  . Tobacco comment: 10/28/2016 'chewed for a couple years after I quit smoking"  Substance and Sexual Activity  . Alcohol use: No    Comment: none since 9/11  . Drug use: No  . Sexual activity: Not Currently  Other Topics Concern  . Not on file  Social History Narrative  . Not on file    Review of Systems:    Constitutional: No weight loss, fever or chills Skin: No rash  Cardiovascular: No chest pain Respiratory: No  SOB  Gastrointestinal: See HPI and otherwise negative Genitourinary: No dysuria  Neurological: No headache, dizziness or syncope Musculoskeletal: No new muscle or joint pain Hematologic: No bruising Psychiatric: No history of depression or anxiety   Physical Exam:  Vital signs: BP 94/60   Pulse 64   Ht 5' 10"  (1.778 m)   Wt 194 lb 8 oz (88.2 kg)   BMI 27.91 kg/m   Constitutional:   Pleasant Caucasian male appears to be in NAD, Well developed, Well nourished, alert and cooperative Head:  Normocephalic and atraumatic. Eyes:   PEERL, EOMI. No icterus. Conjunctiva pink. Ears:  Normal auditory acuity. Neck:  Supple Throat: Oral cavity  and pharynx without inflammation, swelling or lesion.  Respiratory: Respirations even and unlabored. Lungs clear to auscultation bilaterally.   No wheezes, crackles, or rhonchi.  Cardiovascular: Normal S1, S2. No MRG. Regular rate and rhythm. No peripheral edema, cyanosis or pallor.  Gastrointestinal:  Soft, nondistended, nontender. No rebound or guarding. Normal bowel sounds. No appreciable masses or hepatomegaly. Rectal:  Not performed.  Msk:  Symmetrical without gross deformities. Without edema, no deformity or joint abnormality.  Neurologic:  Alert and  oriented x4;  grossly normal neurologically.  Skin:   Dry and intact without significant lesions or rashes. Psychiatric:  Demonstrates good judgement and reason without abnormal affect or behaviors.  RELEVANT LABS AND IMAGING: CBC    Component Value Date/Time   WBC 7.1 02/25/2017 1631   WBC 6.4 10/29/2016 0204   RBC 3.90 (L) 02/25/2017 1631   RBC 5.32 10/29/2016 0204   HGB 11.6 (L) 02/25/2017 1631   HCT 34.4 (L) 02/25/2017 1631   PLT 218 02/25/2017 1631   MCV 88 02/25/2017 1631   MCH 29.7 02/25/2017 1631   MCH 28.8 10/29/2016 0204   MCHC 33.7 02/25/2017 1631   MCHC 33.5 10/29/2016 0204   RDW 14.1 02/25/2017 1631   LYMPHSABS 1.8 02/25/2017 1631   MONOABS 0.5 09/22/2016 1432   EOSABS 0.1  02/25/2017 1631   BASOSABS 0.0 02/25/2017 1631    CMP     Component Value Date/Time   NA 142 02/25/2017 1631   K 4.9 02/25/2017 1631   CL 107 (H) 02/25/2017 1631   CO2 22 02/25/2017 1631   GLUCOSE 107 (H) 02/25/2017 1631   GLUCOSE 99 10/29/2016 0204   BUN 15 02/25/2017 1631   CREATININE 1.00 02/25/2017 1631   CALCIUM 9.6 02/25/2017 1631   GFRNONAA 86 02/25/2017 1631   GFRAA 100 02/25/2017 1631   Assessment: 1.  Anemia: Hemoglobin 11.6, from 15 3 months ago, also with rectal bleeding as below; concern for upper versus lower GI source of blood loss 2.  Rectal bleeding: For the past year per patient, bright red blood, typically when wiping;consider hemorrhoids vs colitis vs polyps vs other 3.  Early satiety: Over the past few months; consider gastritis versus other 4.  GERD: Increased over the past few months per the patient, 3-4 days a week 5. Chronic anticoagulation: for DES placed Nov 01 2016 on Plavix  Plan: 1.  Discussed case with Dr. Henrene Pastor who is supervising this morning, he is unable to accommodate the patient until May.  Spoke with Dr. Hilarie Fredrickson who would be happy to see the patient in the hospital this coming Thursday for EGD and colonoscopy if the patient needs to stay on his blood thinner.  If patient can come off of his Plavix he would be advised to hold this for 5 days prior to time of his procedure.  I am communicating with Dr. [name] to get his recommendations.  If patient holds blood thinner he will be scheduled Monday or Tuesday of next week in the hospital with available physician. 2.  We did review risks, discussed bowel prep. 3.  Patient will await recommendations after communications received above.  Ellouise Newer, PA-C [facility] 03/02/2017, 11:08 AM  Cc: [name], *   03/02/17  1300- Dr. Olin Pia NP sent message explaining that patient sees Dr. Vernell Leep as his primary cardiologist and we needed to speak with him in regards to  holding Plavix. 71- Spoke with patient's cardiologist Dr. Vernell Leep via telephone. He advised that patient could  hold his Plavix for 5 days prior to procedures as his stent was large and research showed that holding anticoagulants 4+ months out only puts the patient at minimal risk. He approved holding his anticoagulation. 1630- Patient was called and told to hold his Plavix for 5 days prior to procedure scheduled with Dr. Silverio Decamp in the hospital next Tuesday 03/10/17. Ellouise Newer, PA-C

## 2017-03-02 NOTE — Progress Notes (Signed)
Assessment and plans reviewed. Appreciate Dr. Hilarie Fredrickson expediting this complicated patient's workup prior to planned cardiac intervention next week

## 2017-03-02 NOTE — Patient Instructions (Signed)
If you are age 53 or older, your body mass index should be between 23-30. Your Body mass index is 27.91 kg/m. If this is out of the aforementioned range listed, please consider follow up with your Primary Care Provider.  If you are age 65 or younger, your body mass index should be between 19-25. Your Body mass index is 27.91 kg/m. If this is out of the aformentioned range listed, please consider follow up with your Primary Care Provider.   You have been scheduled for a colonoscopy. Please follow written instructions given to you at your visit today.  Please pick up your prep supplies at the pharmacy within the next 1-3 days. If you use inhalers (even only as needed), please bring them with you on the day of your procedure. Your physician has requested that you go to www.startemmi.com and enter the access code given to you at your visit today. This web site gives a general overview about your procedure. However, you should still follow specific instructions given to you by our office regarding your preparation for the procedure.

## 2017-03-02 NOTE — H&P (View-Only) (Signed)
Chief Complaint: Anemia  HPI:    [name] is a 52 y/o Caucasian male with a past medical history of CHF (last documented EF October 26 1823%), recent drug-eluding stent placement November 01, 2016, maintained on Plavix and others listed below, who presents to clinic today as a referral from Dr. [name] for a complaint of new anemia.    Per review of chart Dr. [name] would appreciate if our workup including EGD and colonoscopy could occur prior to patient's upcoming pacemaker placement scheduled for March 6.    Recent labs 02/25/17 show a hemoglobin of 11.6, compared to 10/29/16 at 15.3.  Patient was scheduled as a new patient with Dr. Hilarie Fredrickson 03/31/17 but called for sooner appointment.    Today, has multiple GI complaints including bright red blood with his bowel movements for the past year. Describes having some bright red blood on the toilet paper after wiping even without a bowel movement.  Sometimes "the blood can fill the toilet bowl".  History of polyps in his dad and brother, unsure how many.    Also, right-sided "swelling" typically worse after eating, though "I cannot eat that much anymore because I get full".  Feels full very fast as well as new onset of heartburn and some trouble swallowing, associated symptoms of bloating and gas.   Due to get a pacemaker placed in early March for his continued heart failure irregardless of physical therapy.   Denies fever, chills, weight loss, nausea, vomiting or symptoms that awaken him at night.  Past Medical History:  Diagnosis Date  . Bursitis of right shoulder   . CHF (congestive heart failure) ([place])    [name] 10/24/2016  . Dilated cardiomyopathy ([place])    [name] 10/24/2016  . Dysrhythmia 1995   "VT" per patient- states had neg stress test and no problems since  . Heart murmur    "when I was young"  . History of kidney stones   . Hypercholesterolemia     Past Surgical History:  Procedure Laterality Date  . CORONARY ANGIOPLASTY WITH STENT  PLACEMENT  10/28/2016  . CORONARY STENT INTERVENTION N/A 10/28/2016   Procedure: CORONARY STENT INTERVENTION;  Surgeon: [name], MD;  Location: [facility];  Service: Cardiovascular;  Laterality: N/A;  . INGUINAL HERNIA REPAIR Right 02/16/2013   Procedure: RIGHT INGUINAL HERNIA REPAIR ;  Surgeon: [name], MD;  Location: [facility];  Service: General;  Laterality: Right;  With MESH  . INGUINAL HERNIA REPAIR Right 02/16/2013  . INTRAVASCULAR PRESSURE WIRE/FFR STUDY N/A 10/28/2016   Procedure: INTRAVASCULAR PRESSURE WIRE/FFR STUDY;  Surgeon: [name], MD;  Location: [facility];  Service: Cardiovascular;  Laterality: N/A;  . RIGHT/LEFT HEART CATH AND CORONARY ANGIOGRAPHY N/A 10/28/2016   Procedure: RIGHT/LEFT HEART CATH AND CORONARY ANGIOGRAPHY;  Surgeon: [name], MD;  Location: [facility];  Service: Cardiovascular;  Laterality: N/A;  . ULTRASOUND GUIDANCE FOR VASCULAR ACCESS  10/28/2016   Procedure: Ultrasound Guidance For Vascular Access;  Surgeon: [name], MD;  Location: [facility];  Service: Cardiovascular;;    Current Outpatient Medications  Medication Sig Dispense Refill  . aspirin 81 MG chewable tablet Chew 1 tablet (81 mg total) by mouth daily. 90 tablet 3  . bisoprolol (ZEBETA) 5 MG tablet Take 1 tablet (5 mg total) by mouth daily. 30 tablet 1  . clopidogrel (PLAVIX) 75 MG tablet Take 1 tablet (75 mg total) by mouth daily. 90 tablet 2  .  furosemide (LASIX) 40 MG tablet Take 1 tablet (40 mg total) by mouth as needed for edema. 30 tablet 0  . HYDROcodone-acetaminophen (NORCO) 10-325 MG tablet Take 1 tablet by mouth every 4 (four) hours as needed.    . hydrOXYzine (VISTARIL) 100 MG capsule Take 100 mg by mouth at bedtime.    . nicotine polacrilex (NICORETTE) 2 MG gum Take 2 mg by mouth as needed for smoking cessation.    Marland Kitchen omega-3 acid ethyl esters (LOVAZA) 1 G capsule Take 1 g by mouth daily as needed (for  vitamin).     . potassium chloride SA (K-DUR,KLOR-CON) 20 MEQ tablet Take 1 tablet (20 mEq total) by mouth daily. 30 tablet 0  . rosuvastatin (CRESTOR) 20 MG tablet Take 1 tablet (20 mg total) by mouth daily at 6 PM. 30 tablet 3  . sacubitril-valsartan (ENTRESTO) 49-51 MG Take 1 tablet 2 (two) times daily by mouth.    . sacubitril-valsartan (ENTRESTO) 97-103 MG Take 1 tablet by mouth 2 (two) times daily.    Marland Kitchen zolpidem (AMBIEN) 5 MG tablet Take 5 mg by mouth at bedtime.    . Na Sulfate-K Sulfate-Mg Sulf 17.5-3.13-1.6 GM/177ML SOLN Take 1 kit by mouth as directed. 354 mL 0   No current facility-administered medications for this visit.     Allergies as of 03/02/2017  . (No Known Allergies)    History reviewed. No pertinent family history.  Social History   Socioeconomic History  . Marital status: Married    Spouse name: Not on file  . Number of children: Not on file  . Years of education: Not on file  . Highest education level: Not on file  Social Needs  . Financial resource strain: Not on file  . Food insecurity - worry: Not on file  . Food insecurity - inability: Not on file  . Transportation needs - medical: Not on file  . Transportation needs - non-medical: Not on file  Occupational History  . Not on file  Tobacco Use  . Smoking status: Former Smoker    Packs/day: 1.00    Years: 12.00    Pack years: 12.00    Types: Cigarettes, Cigars    Last attempt to quit: 02/16/2011    Years since quitting: 6.0  . Smokeless tobacco: Former Systems developer    Types: Snuff  . Tobacco comment: 10/28/2016 'chewed for a couple years after I quit smoking"  Substance and Sexual Activity  . Alcohol use: No    Comment: none since 9/11  . Drug use: No  . Sexual activity: Not Currently  Other Topics Concern  . Not on file  Social History Narrative  . Not on file    Review of Systems:    Constitutional: No weight loss, fever or chills Skin: No rash  Cardiovascular: No chest pain Respiratory: No  SOB  Gastrointestinal: See HPI and otherwise negative Genitourinary: No dysuria  Neurological: No headache, dizziness or syncope Musculoskeletal: No new muscle or joint pain Hematologic: No bruising Psychiatric: No history of depression or anxiety   Physical Exam:  Vital signs: BP 94/60   Pulse 64   Ht 5' 10"  (1.778 m)   Wt 194 lb 8 oz (88.2 kg)   BMI 27.91 kg/m   Constitutional:   Pleasant Caucasian male appears to be in NAD, Well developed, Well nourished, alert and cooperative Head:  Normocephalic and atraumatic. Eyes:   PEERL, EOMI. No icterus. Conjunctiva pink. Ears:  Normal auditory acuity. Neck:  Supple Throat: Oral cavity  and pharynx without inflammation, swelling or lesion.  Respiratory: Respirations even and unlabored. Lungs clear to auscultation bilaterally.   No wheezes, crackles, or rhonchi.  Cardiovascular: Normal S1, S2. No MRG. Regular rate and rhythm. No peripheral edema, cyanosis or pallor.  Gastrointestinal:  Soft, nondistended, nontender. No rebound or guarding. Normal bowel sounds. No appreciable masses or hepatomegaly. Rectal:  Not performed.  Msk:  Symmetrical without gross deformities. Without edema, no deformity or joint abnormality.  Neurologic:  Alert and  oriented x4;  grossly normal neurologically.  Skin:   Dry and intact without significant lesions or rashes. Psychiatric:  Demonstrates good judgement and reason without abnormal affect or behaviors.  RELEVANT LABS AND IMAGING: CBC    Component Value Date/Time   WBC 7.1 02/25/2017 1631   WBC 6.4 10/29/2016 0204   RBC 3.90 (L) 02/25/2017 1631   RBC 5.32 10/29/2016 0204   HGB 11.6 (L) 02/25/2017 1631   HCT 34.4 (L) 02/25/2017 1631   PLT 218 02/25/2017 1631   MCV 88 02/25/2017 1631   MCH 29.7 02/25/2017 1631   MCH 28.8 10/29/2016 0204   MCHC 33.7 02/25/2017 1631   MCHC 33.5 10/29/2016 0204   RDW 14.1 02/25/2017 1631   LYMPHSABS 1.8 02/25/2017 1631   MONOABS 0.5 09/22/2016 1432   EOSABS 0.1  02/25/2017 1631   BASOSABS 0.0 02/25/2017 1631    CMP     Component Value Date/Time   NA 142 02/25/2017 1631   K 4.9 02/25/2017 1631   CL 107 (H) 02/25/2017 1631   CO2 22 02/25/2017 1631   GLUCOSE 107 (H) 02/25/2017 1631   GLUCOSE 99 10/29/2016 0204   BUN 15 02/25/2017 1631   CREATININE 1.00 02/25/2017 1631   CALCIUM 9.6 02/25/2017 1631   GFRNONAA 86 02/25/2017 1631   GFRAA 100 02/25/2017 1631   Assessment: 1.  Anemia: Hemoglobin 11.6, from 15 3 months ago, also with rectal bleeding as below; concern for upper versus lower GI source of blood loss 2.  Rectal bleeding: For the past year per patient, bright red blood, typically when wiping;consider hemorrhoids vs colitis vs polyps vs other 3.  Early satiety: Over the past few months; consider gastritis versus other 4.  GERD: Increased over the past few months per the patient, 3-4 days a week 5. Chronic anticoagulation: for DES placed Nov 01 2016 on Plavix  Plan: 1.  Discussed case with Dr. Henrene Pastor who is supervising this morning, he is unable to accommodate the patient until May.  Spoke with Dr. Hilarie Fredrickson who would be happy to see the patient in the hospital this coming Thursday for EGD and colonoscopy if the patient needs to stay on his blood thinner.  If patient can come off of his Plavix he would be advised to hold this for 5 days prior to time of his procedure.  I am communicating with Dr. [name] to get his recommendations.  If patient holds blood thinner he will be scheduled Monday or Tuesday of next week in the hospital with available physician. 2.  We did review risks, discussed bowel prep. 3.  Patient will await recommendations after communications received above.  Ellouise Newer, PA-C [facility] 03/02/2017, 11:08 AM  Cc: [name], *   03/02/17  1300- Dr. Olin Pia NP sent message explaining that patient sees Dr. Vernell Leep as his primary cardiologist and we needed to speak with him in regards to  holding Plavix. 71- Spoke with patient's cardiologist Dr. Vernell Leep via telephone. He advised that patient could  hold his Plavix for 5 days prior to procedures as his stent was large and research showed that holding anticoagulants 4+ months out only puts the patient at minimal risk. He approved holding his anticoagulation. 1630- Patient was called and told to hold his Plavix for 5 days prior to procedure scheduled with Dr. Silverio Decamp in the hospital next Tuesday 03/10/17. Ellouise Newer, PA-C

## 2017-03-02 NOTE — Telephone Encounter (Signed)
   Primary Cardiologist: No primary care provider on file.  Chart reviewed as part of pre-operative protocol coverage. This patient is currently not followed by a primary cardiologist at [facility].   The patient's cardiologist is Dr. Vernell Leep with [facility]. Please contact him for clearance.    Truitt Merle, NP 03/02/2017, 1:40 PM

## 2017-03-02 NOTE — Telephone Encounter (Signed)
New message   They need a call back today 03/02/17 Dr [name] referred patient to them       [facility] Pre-operative Risk Assessment    Request for surgical clearance:  1. What type of surgery is being performed? EGD colonoscopy  2. When is this surgery scheduled? Not scheduled yet   3. What type of clearance is required (medical clearance vs. Pharmacy clearance to hold med vs. Both)? Pharmacy /medical   4. Are there any medications that need to be held prior to surgery and how long? Plavix -patient is having a pacemaker placed on March 6th and just had a stent put in October, is it safe to stop the plavix to have testing dione   5. Practice name and name of physician performing surgery?Dr Hilarie Fredrickson   6. What is your office phone and fax number? [phone] fax [phone]   7. Anesthesia type (None, local, MAC, general) ? ?   Howie Ill 03/02/2017, 9:54 AM  _________________________________________________________________   (provider comments below)

## 2017-03-03 ENCOUNTER — Telehealth: Payer: Self-pay | Admitting: Emergency Medicine

## 2017-03-03 ENCOUNTER — Other Ambulatory Visit: Payer: Self-pay | Admitting: Emergency Medicine

## 2017-03-03 DIAGNOSIS — K219 Gastro-esophageal reflux disease without esophagitis: Secondary | ICD-10-CM

## 2017-03-03 DIAGNOSIS — Z7901 Long term (current) use of anticoagulants: Secondary | ICD-10-CM

## 2017-03-03 DIAGNOSIS — D649 Anemia, unspecified: Secondary | ICD-10-CM

## 2017-03-03 DIAGNOSIS — K921 Melena: Secondary | ICD-10-CM

## 2017-03-03 DIAGNOSIS — R6881 Early satiety: Secondary | ICD-10-CM

## 2017-03-03 NOTE — Telephone Encounter (Signed)
Received fax from Dr. Virgina Jock that patient is ok to hold Plavix 5 days before his procedure. Pt informed and verabalized understanding. He will take his last dose on Wednesday 03-04-17 and then resume it after the procedure per his discharge instructions.

## 2017-03-04 ENCOUNTER — Other Ambulatory Visit: Payer: Self-pay

## 2017-03-04 ENCOUNTER — Encounter (HOSPITAL_COMMUNITY): Payer: Self-pay | Admitting: Emergency Medicine

## 2017-03-04 ENCOUNTER — Encounter (HOSPITAL_COMMUNITY)
Admission: RE | Admit: 2017-03-04 | Discharge: 2017-03-04 | Disposition: A | Discharge status: Home / Self Care | Payer: 59 | Source: Ambulatory Visit | Attending: Cardiology | Admitting: Cardiology

## 2017-03-04 DIAGNOSIS — Z955 Presence of coronary angioplasty implant and graft: Secondary | ICD-10-CM

## 2017-03-04 DIAGNOSIS — I509 Heart failure, unspecified: Secondary | ICD-10-CM | POA: Diagnosis not present

## 2017-03-06 ENCOUNTER — Encounter (HOSPITAL_COMMUNITY): Payer: Self-pay

## 2017-03-06 ENCOUNTER — Encounter (HOSPITAL_COMMUNITY)
Admission: RE | Admit: 2017-03-06 | Discharge: 2017-03-06 | Disposition: A | Discharge status: Home / Self Care | Payer: 59 | Source: Ambulatory Visit | Attending: Cardiology | Admitting: Cardiology

## 2017-03-06 DIAGNOSIS — I420 Dilated cardiomyopathy: Secondary | ICD-10-CM | POA: Insufficient documentation

## 2017-03-06 DIAGNOSIS — Z7982 Long term (current) use of aspirin: Secondary | ICD-10-CM | POA: Diagnosis not present

## 2017-03-06 DIAGNOSIS — I509 Heart failure, unspecified: Secondary | ICD-10-CM | POA: Diagnosis not present

## 2017-03-06 DIAGNOSIS — Z79899 Other long term (current) drug therapy: Secondary | ICD-10-CM | POA: Diagnosis not present

## 2017-03-06 DIAGNOSIS — Z87891 Personal history of nicotine dependence: Secondary | ICD-10-CM | POA: Diagnosis not present

## 2017-03-06 DIAGNOSIS — Z955 Presence of coronary angioplasty implant and graft: Secondary | ICD-10-CM

## 2017-03-10 ENCOUNTER — Ambulatory Visit (HOSPITAL_COMMUNITY): Payer: 59 | Admitting: Anesthesiology

## 2017-03-10 ENCOUNTER — Encounter (HOSPITAL_COMMUNITY): Payer: Self-pay | Admitting: Emergency Medicine

## 2017-03-10 ENCOUNTER — Encounter (HOSPITAL_COMMUNITY)
Admission: RE | Disposition: A | Discharge status: Home / Self Care | Payer: Self-pay | Source: Ambulatory Visit | Attending: Gastroenterology

## 2017-03-10 ENCOUNTER — Ambulatory Visit (HOSPITAL_COMMUNITY)
Admission: RE | Admit: 2017-03-10 | Discharge: 2017-03-10 | Disposition: A | Discharge status: Home / Self Care | Payer: 59 | Source: Ambulatory Visit | Attending: Gastroenterology | Admitting: Gastroenterology

## 2017-03-10 ENCOUNTER — Telehealth: Payer: Self-pay | Admitting: Gastroenterology

## 2017-03-10 ENCOUNTER — Other Ambulatory Visit: Payer: Self-pay

## 2017-03-10 DIAGNOSIS — K2970 Gastritis, unspecified, without bleeding: Secondary | ICD-10-CM

## 2017-03-10 DIAGNOSIS — Z7902 Long term (current) use of antithrombotics/antiplatelets: Secondary | ICD-10-CM | POA: Insufficient documentation

## 2017-03-10 DIAGNOSIS — E7800 Pure hypercholesterolemia, unspecified: Secondary | ICD-10-CM | POA: Diagnosis not present

## 2017-03-10 DIAGNOSIS — K625 Hemorrhage of anus and rectum: Secondary | ICD-10-CM | POA: Diagnosis present

## 2017-03-10 DIAGNOSIS — Z955 Presence of coronary angioplasty implant and graft: Secondary | ICD-10-CM | POA: Diagnosis not present

## 2017-03-10 DIAGNOSIS — I420 Dilated cardiomyopathy: Secondary | ICD-10-CM | POA: Insufficient documentation

## 2017-03-10 DIAGNOSIS — K5730 Diverticulosis of large intestine without perforation or abscess without bleeding: Secondary | ICD-10-CM | POA: Insufficient documentation

## 2017-03-10 DIAGNOSIS — I509 Heart failure, unspecified: Secondary | ICD-10-CM | POA: Diagnosis not present

## 2017-03-10 DIAGNOSIS — K635 Polyp of colon: Secondary | ICD-10-CM | POA: Insufficient documentation

## 2017-03-10 DIAGNOSIS — K269 Duodenal ulcer, unspecified as acute or chronic, without hemorrhage or perforation: Secondary | ICD-10-CM

## 2017-03-10 DIAGNOSIS — K200 Eosinophilic esophagitis: Secondary | ICD-10-CM | POA: Diagnosis not present

## 2017-03-10 DIAGNOSIS — K648 Other hemorrhoids: Secondary | ICD-10-CM | POA: Diagnosis not present

## 2017-03-10 DIAGNOSIS — K219 Gastro-esophageal reflux disease without esophagitis: Secondary | ICD-10-CM | POA: Insufficient documentation

## 2017-03-10 DIAGNOSIS — Z7982 Long term (current) use of aspirin: Secondary | ICD-10-CM | POA: Diagnosis not present

## 2017-03-10 DIAGNOSIS — Z87891 Personal history of nicotine dependence: Secondary | ICD-10-CM | POA: Diagnosis not present

## 2017-03-10 DIAGNOSIS — K222 Esophageal obstruction: Secondary | ICD-10-CM | POA: Insufficient documentation

## 2017-03-10 DIAGNOSIS — K2950 Unspecified chronic gastritis without bleeding: Secondary | ICD-10-CM | POA: Diagnosis not present

## 2017-03-10 DIAGNOSIS — K2951 Unspecified chronic gastritis with bleeding: Secondary | ICD-10-CM | POA: Diagnosis not present

## 2017-03-10 DIAGNOSIS — Z79899 Other long term (current) drug therapy: Secondary | ICD-10-CM | POA: Insufficient documentation

## 2017-03-10 DIAGNOSIS — D649 Anemia, unspecified: Secondary | ICD-10-CM | POA: Diagnosis present

## 2017-03-10 DIAGNOSIS — R6881 Early satiety: Secondary | ICD-10-CM | POA: Diagnosis not present

## 2017-03-10 DIAGNOSIS — D125 Benign neoplasm of sigmoid colon: Secondary | ICD-10-CM

## 2017-03-10 DIAGNOSIS — Z87442 Personal history of urinary calculi: Secondary | ICD-10-CM | POA: Insufficient documentation

## 2017-03-10 DIAGNOSIS — B3781 Candidal esophagitis: Secondary | ICD-10-CM

## 2017-03-10 DIAGNOSIS — R1310 Dysphagia, unspecified: Secondary | ICD-10-CM

## 2017-03-10 HISTORY — PX: COLONOSCOPY WITH PROPOFOL: SHX5780

## 2017-03-10 HISTORY — PX: ESOPHAGOGASTRODUODENOSCOPY (EGD) WITH PROPOFOL: SHX5813

## 2017-03-10 SURGERY — COLONOSCOPY WITH PROPOFOL
Anesthesia: Monitor Anesthesia Care

## 2017-03-10 MED ORDER — ONDANSETRON HCL 4 MG/2ML IJ SOLN
INTRAMUSCULAR | Status: DC | PRN
  Administered 2017-03-10: 4 mg via INTRAVENOUS

## 2017-03-10 MED ORDER — PROPOFOL 500 MG/50ML IV EMUL
INTRAVENOUS | Status: DC | PRN
  Administered 2017-03-10: 100 ug/kg/min via INTRAVENOUS

## 2017-03-10 MED ORDER — PROPOFOL 500 MG/50ML IV EMUL
INTRAVENOUS | Status: DC | PRN
  Administered 2017-03-10: 30 mg via INTRAVENOUS
  Administered 2017-03-10: 70 mg via INTRAVENOUS

## 2017-03-10 MED ORDER — PROPOFOL 10 MG/ML IV BOLUS
INTRAVENOUS | Status: AC
  Filled 2017-03-10: qty 20

## 2017-03-10 MED ORDER — PHENYLEPHRINE HCL 10 MG/ML IJ SOLN
INTRAMUSCULAR | Status: DC | PRN
  Administered 2017-03-10 (×2): 40 ug via INTRAVENOUS

## 2017-03-10 MED ORDER — MIDAZOLAM HCL 5 MG/5ML IJ SOLN
INTRAMUSCULAR | Status: DC | PRN
  Administered 2017-03-10: 2 mg via INTRAVENOUS

## 2017-03-10 MED ORDER — METOPROLOL TARTRATE 5 MG/5ML IV SOLN
2.0000 mg | Freq: Once | INTRAVENOUS | Status: AC
  Administered 2017-03-10: 2 mg via INTRAVENOUS
  Filled 2017-03-10: qty 5

## 2017-03-10 MED ORDER — LACTATED RINGERS IV SOLN
INTRAVENOUS | Status: DC
  Administered 2017-03-10: 12:00:00 via INTRAVENOUS
  Administered 2017-03-10: 1000 mL via INTRAVENOUS

## 2017-03-10 MED ORDER — MIDAZOLAM HCL 2 MG/2ML IJ SOLN
INTRAMUSCULAR | Status: AC
  Filled 2017-03-10: qty 2

## 2017-03-10 MED ORDER — METOPROLOL TARTRATE 5 MG/5ML IV SOLN
5.0000 mg | Freq: Once | INTRAVENOUS | Status: DC
  Filled 2017-03-10: qty 5

## 2017-03-10 MED ORDER — ESOMEPRAZOLE MAGNESIUM 40 MG PO PACK: 40.0000 mg | PACK | Freq: Every day | ORAL | 2 refills | Status: DC

## 2017-03-10 SURGICAL SUPPLY — 25 items

## 2017-03-10 NOTE — Transfer of Care (Signed)
Immediate Anesthesia Transfer of Care Note  Patient: Miguel Pena  Procedure(s) Performed: COLONOSCOPY WITH PROPOFOL (N/A ) ESOPHAGOGASTRODUODENOSCOPY (EGD) WITH PROPOFOL (N/A )  Patient Location: PACU  Anesthesia Type:MAC  Level of Consciousness: awake, alert  and oriented  Airway & Oxygen Therapy: Patient Spontanous Breathing and Patient connected to nasal cannula oxygen  Post-op Assessment: Report given to RN  Post vital signs: Reviewed and stable  Last Vitals:  Vitals:   03/10/17 1246 03/10/17 1353  BP:  (!) 94/51  Pulse: (!) 52 (!) 54  Resp:  20  Temp:    SpO2:  98%    Last Pain:  Vitals:   03/10/17 1353  TempSrc: Oral         Complications: No apparent anesthesia complications

## 2017-03-10 NOTE — Anesthesia Preprocedure Evaluation (Addendum)
Anesthesia Evaluation  Patient identified by MRN, date of birth, ID band Patient awake    Reviewed: Allergy & Precautions, H&P , NPO status , Patient's Chart, lab work & pertinent test results  History of Anesthesia Complications Negative for: history of anesthetic complications  Airway Mallampati: II  TM Distance: >3 FB Neck ROM: full    Dental  (+) Dental Advisory Given, Chipped, Caps Large chip left upper front.  Cap right upper front:   Pulmonary former smoker,    Pulmonary exam normal breath sounds clear to auscultation       Cardiovascular Exercise Tolerance: Good Pt. on home beta blockers + Cardiac Stents and +CHF  Normal cardiovascular exam+ dysrhythmias Ventricular Tachycardia  Rhythm:regular Rate:Normal  LBBB.  VT 20 years ago but not again.  No previous ECG to see if LBBB is recent.   Neuro/Psych negative neurological ROS  negative psych ROS   GI/Hepatic Neg liver ROS,   Endo/Other  negative endocrine ROS  Renal/GU negative Renal ROS  negative genitourinary   Musculoskeletal   Abdominal   Peds  Hematology negative hematology ROS (+)   Anesthesia Other Findings   Reproductive/Obstetrics negative OB ROS                            Anesthesia Physical  Anesthesia Plan  ASA: III  Anesthesia Plan: MAC   Post-op Pain Management:    Induction:   PONV Risk Score and Plan: 2 and Ondansetron and Propofol infusion  Airway Management Planned: Natural Airway  Additional Equipment:   Intra-op Plan:   Post-operative Plan:   Informed Consent: I have reviewed the patients History and Physical, chart, labs and discussed the procedure including the risks, benefits and alternatives for the proposed anesthesia with the patient or authorized representative who has indicated his/her understanding and acceptance.   Dental Advisory Given  Plan Discussed with:   Anesthesia Plan  Comments:         Anesthesia Quick Evaluation

## 2017-03-10 NOTE — Discharge Instructions (Signed)
YOU HAD AN ENDOSCOPIC PROCEDURE TODAY: Refer to the procedure report and other information in the discharge instructions given to you for any specific questions about what was found during the examination. If this information does not answer your questions, please call [facility] office at [phone] to clarify.  ° °YOU SHOULD EXPECT: Some feelings of bloating in the abdomen. Passage of more gas than usual. Walking can help get rid of the air that was put into your GI tract during the procedure and reduce the bloating. If you had a lower endoscopy (such as a colonoscopy or flexible sigmoidoscopy) you may notice spotting of blood in your stool or on the toilet paper. Some abdominal soreness may be present for a day or two, also. ° °DIET: Your first meal following the procedure should be a light meal and then it is ok to progress to your normal diet. A half-sandwich or bowl of soup is an example of a good first meal. Heavy or fried foods are harder to digest and may make you feel nauseous or bloated. Drink plenty of fluids but you should avoid alcoholic beverages for 24 hours. If you had a esophageal dilation, please see attached instructions for diet.   ° °ACTIVITY: Your care partner should take you home directly after the procedure. You should plan to take it easy, moving slowly for the rest of the day. You can resume normal activity the day after the procedure however YOU SHOULD NOT DRIVE, use power tools, machinery or perform tasks that involve climbing or major physical exertion for 24 hours (because of the sedation medicines used during the test).  ° °SYMPTOMS TO REPORT IMMEDIATELY: °A gastroenterologist can be reached at any hour. Please call [phone]  for any of the following symptoms:  °Following lower endoscopy (colonoscopy, flexible sigmoidoscopy) °Excessive amounts of blood in the stool  °Significant tenderness, worsening of abdominal pains  °Swelling of the abdomen that is new, acute  °Fever of 100° or  higher  °Following upper endoscopy (EGD, EUS, ERCP, esophageal dilation) °Vomiting of blood or coffee ground material  °New, significant abdominal pain  °New, significant chest pain or pain under the shoulder blades  °Painful or persistently difficult swallowing  °New shortness of breath  °Black, tarry-looking or red, bloody stools ° °FOLLOW UP:  °If any biopsies were taken you will be contacted by phone or by letter within the next 1-3 weeks. Call [phone]  if you have not heard about the biopsies in 3 weeks.  °Please also call with any specific questions about appointments or follow up tests. ° °

## 2017-03-10 NOTE — Anesthesia Postprocedure Evaluation (Signed)
Anesthesia Post Note  Patient: Miguel Pena  Procedure(s) Performed: COLONOSCOPY WITH PROPOFOL (N/A ) ESOPHAGOGASTRODUODENOSCOPY (EGD) WITH PROPOFOL (N/A )     Patient location during evaluation: Endoscopy Anesthesia Type: MAC Level of consciousness: awake and alert Pain management: pain level controlled Vital Signs Assessment: post-procedure vital signs reviewed and stable Respiratory status: spontaneous breathing, nonlabored ventilation, respiratory function stable and patient connected to nasal cannula oxygen Cardiovascular status: stable and blood pressure returned to baseline Postop Assessment: no apparent nausea or vomiting Anesthetic complications: no    Last Vitals:  Vitals:   03/10/17 1400 03/10/17 1410  BP: (!) 110/55   Pulse: (!) 50 63  Resp: 17 15  Temp:    SpO2: 100% 100%    Last Pain:  Vitals:   03/10/17 1353  TempSrc: Oral                 Adeleine Pask DANIEL

## 2017-03-10 NOTE — Op Note (Signed)
[facility] Patient Name: Miguel Pena Procedure Date: 03/10/2017 MRN: [id number] Attending MD: Mauri Pole , MD Date of Birth: 04-30-1965 CSN: [id number] Age: 52 Admit Type: Inpatient Procedure:                Upper GI endoscopy Indications:              Suspected upper gastrointestinal bleeding in                            patient with chronic blood loss, Gastrointestinal                            bleeding of unknown origin, Dysphagia, Suspected                            esophageal reflux Providers:                Mauri Pole, MD, Carmie End, RN,                            Alan Mulder, Technician, Arnoldo Hooker, CRNA Referring MD:              Medicines:                Monitored Anesthesia Care Complications:            No immediate complications. Estimated Blood Loss:     Estimated blood loss was minimal. Procedure:                Pre-Anesthesia Assessment:                           - Prior to the procedure, a History and Physical                            was performed, and patient medications and                            allergies were reviewed. The patient's tolerance of                            previous anesthesia was also reviewed. The risks                            and benefits of the procedure and the sedation                            options and risks were discussed with the patient.                            All questions were answered, and informed consent                            was obtained. Prior Anticoagulants: The patient                            last  took aspirin 1 day and Plavix (clopidogrel) 5                            days prior to the procedure. ASA Grade Assessment:                            III - A patient with severe systemic disease. After                            reviewing the risks and benefits, the patient was                            deemed in satisfactory condition to undergo the              procedure.                           After obtaining informed consent, the endoscope was                            passed under direct vision. Throughout the                            procedure, the patient's blood pressure, pulse, and                            oxygen saturations were monitored continuously. The                            Endoscope was introduced through the mouth, and                            advanced to the second part of duodenum. The upper                            GI endoscopy was accomplished without difficulty.                            The patient tolerated the procedure well. Scope In: Scope Out: Findings:      LA Grade C (one or more mucosal breaks continuous between tops of 2 or       more mucosal folds, less than 75% circumference) esophagitis with no       bleeding was found 25 to 40 cm from the incisors. Biopsies were taken       with a cold forceps for histology.      One moderate (circumferential scarring or stenosis; an endoscope may       pass) benign-appearing, intrinsic stenosis was found 34 to 36 cm from       the incisors. This measured 1.5 cm (inner diameter) x 1 cm (in length)       and was traversed.      Patchy moderate inflammation with hemorrhage characterized by adherent       blood, congestion (edema), erosions and erythema was found in the entire       examined stomach. Biopsies  were taken with a cold forceps for       Helicobacter pylori testing.      A few localized erosions without bleeding were found in the duodenal       bulb.      The exam was otherwise without abnormality. Impression:               - LA Grade C reflux and candidiasis esophagitis.                            Biopsied.                           - Benign-appearing esophageal stenosis.                           - Gastritis with hemorrhage. Biopsied.                           - Duodenal erosions without bleeding.                           - The  examination was otherwise normal. Moderate Sedation:      N/A- Per Anesthesia Care Recommendation:           - Patient has a contact number available for                            emergencies. The signs and symptoms of potential                            delayed complications were discussed with the                            patient. Return to normal activities tomorrow.                            Written discharge instructions were provided to the                            patient.                           - Resume previous diet.                           - Continue present medications.                           - Resume Plavix (clopidogrel) at prior dose in 2                            days. Refer to managing physician for further                            adjustment of therapy.                           -  Await pathology results.                           - Follow an antireflux regimen.                           - Use Nexium (esomeprazole) 40 mg packet PO BID                            before breakfast and dinner                           - See the other procedure note for documentation of                            additional recommendations. Procedure Code(s):        --- Professional ---                           [phone], Esophagogastroduodenoscopy, flexible,                            transoral; with biopsy, single or multiple Diagnosis Code(s):        --- Professional ---                           B37.81, Candidal esophagitis                           K22.2, Esophageal obstruction                           K29.71, Gastritis, unspecified, with bleeding                           K26.9, Duodenal ulcer, unspecified as acute or                            chronic, without hemorrhage or perforation                           R58, Hemorrhage, not elsewhere classified                           K92.2, Gastrointestinal hemorrhage, unspecified                           R13.10, Dysphagia,  unspecified CPT copyright 2016 American [employer]. All rights reserved. The codes documented in this report are preliminary and upon coder review may  be revised to meet current compliance requirements. Mauri Pole, MD 03/10/2017 1:54:49 PM This report has been signed electronically. Number of Addenda: 0

## 2017-03-10 NOTE — Op Note (Signed)
[facility] Patient Name: Miguel Pena Procedure Date: 03/10/2017 MRN: [id number] Attending MD: Mauri Pole , MD Date of Birth: 07-19-1965 CSN: [id number] Age: 52 Admit Type: Inpatient Procedure:                Colonoscopy Indications:              Evaluation of unexplained GI bleeding Providers:                Mauri Pole, MD, Carmie End, RN,                            Alan Mulder, Technician, Arnoldo Hooker, CRNA Referring MD:              Medicines:                Monitored Anesthesia Care Complications:            No immediate complications. Estimated Blood Loss:     Estimated blood loss was minimal. Procedure:                Pre-Anesthesia Assessment:                           - Prior to the procedure, a History and Physical                            was performed, and patient medications and                            allergies were reviewed. The patient's tolerance of                            previous anesthesia was also reviewed. The risks                            and benefits of the procedure and the sedation                            options and risks were discussed with the patient.                            All questions were answered, and informed consent                            was obtained. Prior Anticoagulants: The patient                            last took aspirin 1 day and Plavix (clopidogrel) 5                            days prior to the procedure. ASA Grade Assessment:                            III - A patient with severe systemic disease. After  reviewing the risks and benefits, the patient was                            deemed in satisfactory condition to undergo the                            procedure.                           After obtaining informed consent, the colonoscope                            was passed under direct vision. Throughout the                            procedure,  the patient's blood pressure, pulse, and                            oxygen saturations were monitored continuously. The                            EC-[id number] ([id number]) scope was introduced through                            the anus and advanced to the the cecum, identified                            by appendiceal orifice and ileocecal valve. The                            colonoscopy was performed without difficulty. The                            patient tolerated the procedure well. The quality                            of the bowel preparation was excellent. The                            ileocecal valve, appendiceal orifice, and rectum                            were photographed. Scope In: 1:20:40 PM Scope Out: 1:44:00 PM Scope Withdrawal Time: 0 hours 17 minutes 7 seconds  Total Procedure Duration: 0 hours 23 minutes 20 seconds  Findings:      The perianal and digital rectal examinations were normal.      Multiple small and large-mouthed diverticula were found in the sigmoid       colon and descending colon. There was narrowing of the colon in       association with the diverticular opening. Peri-diverticular erythema       was seen. There was no evidence of diverticular bleeding.      A 5 mm polyp was found in the sigmoid colon. The polyp was sessile. The       polyp was  removed with a cold snare. Resection and retrieval were       complete. To prevent bleeding after the polypectomy, two hemostatic       clips were successfully placed (MR conditional). There was no bleeding       at the end of the procedure.      Non-bleeding internal hemorrhoids were found during retroflexion. The       hemorrhoids were large. Impression:               - Moderate diverticulosis in the sigmoid colon and                            in the descending colon. There was narrowing of the                            colon in association with the diverticular opening.                             Peri-diverticular erythema was seen. There was no                            evidence of diverticular bleeding.                           - One 5 mm polyp in the sigmoid colon, removed with                            a cold snare. Resected and retrieved. Clips (MR                            conditional) were placed.                           - Non-bleeding internal hemorrhoids. Moderate Sedation:      N/A- Per Anesthesia Care Recommendation:           - Patient has a contact number available for                            emergencies. The signs and symptoms of potential                            delayed complications were discussed with the                            patient. Return to normal activities tomorrow.                            Written discharge instructions were provided to the                            patient.                           - Resume previous diet.                           -  Continue present medications.                           - Await pathology results.                           - Repeat colonoscopy in 5-10 years for surveillance                            based on pathology results.                           - Resume Plavix (clopidogrel) at prior dose in 2                            days. Refer to managing physician for further                            adjustment of therapy. Procedure Code(s):        --- Professional ---                           [phone], Colonoscopy, flexible; with removal of                            tumor(s), polyp(s), or other lesion(s) by snare                            technique Diagnosis Code(s):        --- Professional ---                           K64.8, Other hemorrhoids                           D12.5, Benign neoplasm of sigmoid colon                           K92.2, Gastrointestinal hemorrhage, unspecified                           K57.30, Diverticulosis of large intestine without                            perforation or  abscess without bleeding CPT copyright 2016 American Medical Association. All rights reserved. The codes documented in this report are preliminary and upon coder review may  be revised to meet current compliance requirements. Mauri Pole, MD 03/10/2017 1:58:06 PM This report has been signed electronically. Number of Addenda: 0

## 2017-03-10 NOTE — Interval H&P Note (Signed)
History and Physical Interval Note:  03/10/2017 12:46 PM  Miguel Pena  has presented today for surgery, with the diagnosis of anemia, rectal bleeding, early satiety, GERD/db  The various methods of treatment have been discussed with the patient and family. After consideration of risks, benefits and other options for treatment, the patient has consented to  Procedure(s): COLONOSCOPY WITH PROPOFOL (N/A) ESOPHAGOGASTRODUODENOSCOPY (EGD) WITH PROPOFOL (N/A) as a surgical intervention .  The patient's history has been reviewed, patient examined, no change in status, stable for surgery.  I have reviewed the patient's chart and labs.  Questions were answered to the patient's satisfaction.     Kavitha Nandigam

## 2017-03-11 ENCOUNTER — Encounter (HOSPITAL_COMMUNITY): Payer: Self-pay | Admitting: Gastroenterology

## 2017-03-12 DIAGNOSIS — K219 Gastro-esophageal reflux disease without esophagitis: Secondary | ICD-10-CM

## 2017-03-12 DIAGNOSIS — K635 Polyp of colon: Secondary | ICD-10-CM

## 2017-03-12 DIAGNOSIS — R1310 Dysphagia, unspecified: Secondary | ICD-10-CM

## 2017-03-12 DIAGNOSIS — K625 Hemorrhage of anus and rectum: Secondary | ICD-10-CM

## 2017-03-12 DIAGNOSIS — D125 Benign neoplasm of sigmoid colon: Secondary | ICD-10-CM

## 2017-03-13 ENCOUNTER — Telehealth (HOSPITAL_COMMUNITY): Payer: Self-pay | Admitting: Cardiac Rehabilitation

## 2017-03-16 ENCOUNTER — Encounter (HOSPITAL_COMMUNITY)
Admission: RE | Disposition: A | Discharge status: Home / Self Care | Payer: Self-pay | Source: Ambulatory Visit | Attending: Internal Medicine

## 2017-03-16 ENCOUNTER — Ambulatory Visit (HOSPITAL_COMMUNITY)
Admission: RE | Admit: 2017-03-16 | Discharge: 2017-03-17 | Disposition: A | Discharge status: Home / Self Care | Payer: 59 | Source: Ambulatory Visit | Attending: Internal Medicine | Admitting: Internal Medicine

## 2017-03-16 ENCOUNTER — Other Ambulatory Visit: Payer: Self-pay

## 2017-03-16 DIAGNOSIS — I5042 Chronic combined systolic (congestive) and diastolic (congestive) heart failure: Secondary | ICD-10-CM

## 2017-03-16 DIAGNOSIS — Z87891 Personal history of nicotine dependence: Secondary | ICD-10-CM | POA: Diagnosis not present

## 2017-03-16 DIAGNOSIS — Z959 Presence of cardiac and vascular implant and graft, unspecified: Secondary | ICD-10-CM

## 2017-03-16 DIAGNOSIS — I340 Nonrheumatic mitral (valve) insufficiency: Secondary | ICD-10-CM | POA: Diagnosis not present

## 2017-03-16 DIAGNOSIS — Z955 Presence of coronary angioplasty implant and graft: Secondary | ICD-10-CM | POA: Insufficient documentation

## 2017-03-16 DIAGNOSIS — Z006 Encounter for examination for normal comparison and control in clinical research program: Secondary | ICD-10-CM | POA: Insufficient documentation

## 2017-03-16 DIAGNOSIS — Z7982 Long term (current) use of aspirin: Secondary | ICD-10-CM | POA: Diagnosis not present

## 2017-03-16 DIAGNOSIS — I447 Left bundle-branch block, unspecified: Secondary | ICD-10-CM | POA: Diagnosis not present

## 2017-03-16 DIAGNOSIS — E785 Hyperlipidemia, unspecified: Secondary | ICD-10-CM | POA: Diagnosis not present

## 2017-03-16 DIAGNOSIS — I5022 Chronic systolic (congestive) heart failure: Secondary | ICD-10-CM | POA: Insufficient documentation

## 2017-03-16 DIAGNOSIS — Z95810 Presence of automatic (implantable) cardiac defibrillator: Secondary | ICD-10-CM | POA: Insufficient documentation

## 2017-03-16 DIAGNOSIS — I429 Cardiomyopathy, unspecified: Secondary | ICD-10-CM | POA: Diagnosis not present

## 2017-03-16 DIAGNOSIS — I2510 Atherosclerotic heart disease of native coronary artery without angina pectoris: Secondary | ICD-10-CM | POA: Diagnosis not present

## 2017-03-16 DIAGNOSIS — I428 Other cardiomyopathies: Secondary | ICD-10-CM

## 2017-03-16 DIAGNOSIS — Z79899 Other long term (current) drug therapy: Secondary | ICD-10-CM | POA: Insufficient documentation

## 2017-03-16 DIAGNOSIS — E7800 Pure hypercholesterolemia, unspecified: Secondary | ICD-10-CM | POA: Diagnosis not present

## 2017-03-16 DIAGNOSIS — Z7902 Long term (current) use of antithrombotics/antiplatelets: Secondary | ICD-10-CM | POA: Insufficient documentation

## 2017-03-16 HISTORY — PX: BIV ICD INSERTION CRT-D: EP1195

## 2017-03-16 HISTORY — DX: Presence of automatic (implantable) cardiac defibrillator: Z95.810

## 2017-03-16 HISTORY — DX: Other cardiomyopathies: I42.8

## 2017-03-16 HISTORY — DX: Chronic combined systolic (congestive) and diastolic (congestive) heart failure: I50.42

## 2017-03-16 LAB — BASIC METABOLIC PANEL
ANION GAP: 9 (ref 5–15)
BUN: 9 mg/dL (ref 6–20)
CHLORIDE: 107 mmol/L (ref 101–111)
CO2: 23 mmol/L (ref 22–32)
Calcium: 9.7 mg/dL (ref 8.9–10.3)
Creatinine, Ser: 1.05 mg/dL (ref 0.61–1.24)
GFR calc non Af Amer: >60 mL/min (ref >60)
GLUCOSE: 110 mg/dL — AB (ref 65–99)
POTASSIUM: 4.2 mmol/L (ref 3.5–5.1)
Sodium: 139 mmol/L (ref 135–145)

## 2017-03-16 LAB — CBC
HEMATOCRIT: 30.7 % — AB (ref 39.0–52.0)
HEMOGLOBIN: 10.3 g/dL — AB (ref 13.0–17.0)
MCH: 29.9 pg (ref 26.0–34.0)
MCHC: 33.6 g/dL (ref 30.0–36.0)
MCV: 89 fL (ref 78.0–100.0)
Platelets: 188 10*3/uL (ref 150–400)
RBC: 3.45 MIL/uL — AB (ref 4.22–5.81)
RDW: 13.2 % (ref 11.5–15.5)
WBC: 5.2 10*3/uL (ref 4.0–10.5)

## 2017-03-16 LAB — SURGICAL PCR SCREEN
MRSA, PCR: NEGATIVE
Staphylococcus aureus: POSITIVE — AB

## 2017-03-16 SURGERY — BIV ICD INSERTION CRT-D

## 2017-03-16 MED ORDER — CHLORHEXIDINE GLUCONATE 4 % EX LIQD
60.0000 mL | Freq: Once | CUTANEOUS | Status: DC
  Filled 2017-03-16: qty 60

## 2017-03-16 MED ORDER — FENTANYL CITRATE (PF) 100 MCG/2ML IJ SOLN
INTRAMUSCULAR | Status: AC
  Filled 2017-03-16: qty 2

## 2017-03-16 MED ORDER — ACETAMINOPHEN 325 MG PO TABS: 325.0000 mg | ORAL_TABLET | ORAL | Status: DC | PRN

## 2017-03-16 MED ORDER — SODIUM CHLORIDE 0.9 % IV SOLN
INTRAVENOUS | Status: DC
  Administered 2017-03-16: 07:00:00 via INTRAVENOUS

## 2017-03-16 MED ORDER — MUPIROCIN 2 % EX OINT
1.0000 "application " | TOPICAL_OINTMENT | Freq: Once | CUTANEOUS | Status: AC
  Administered 2017-03-16: 1 via TOPICAL
  Filled 2017-03-16: qty 22

## 2017-03-16 MED ORDER — HYDROXYZINE HCL 25 MG PO TABS
100.0000 mg | ORAL_TABLET | Freq: Every day | ORAL | Status: DC
  Administered 2017-03-16: 100 mg via ORAL
  Filled 2017-03-16: qty 4

## 2017-03-16 MED ORDER — CEFAZOLIN SODIUM-DEXTROSE 2-4 GM/100ML-% IV SOLN
INTRAVENOUS | Status: AC
  Filled 2017-03-16: qty 100

## 2017-03-16 MED ORDER — SPIRONOLACTONE 25 MG PO TABS
25.0000 mg | ORAL_TABLET | Freq: Every day | ORAL | Status: DC
  Administered 2017-03-16 – 2017-03-17 (×2): 25 mg via ORAL
  Filled 2017-03-16 (×2): qty 1

## 2017-03-16 MED ORDER — FUROSEMIDE 40 MG PO TABS: 40.0000 mg | ORAL_TABLET | Freq: Every day | ORAL | Status: DC | PRN

## 2017-03-16 MED ORDER — HYDROXYZINE PAMOATE 50 MG PO CAPS
100.0000 mg | ORAL_CAPSULE | Freq: Every day | ORAL | Status: DC
  Filled 2017-03-16: qty 2

## 2017-03-16 MED ORDER — HEPARIN (PORCINE) IN NACL 2-0.9 UNIT/ML-% IJ SOLN
INTRAMUSCULAR | Status: AC
  Filled 2017-03-16: qty 500

## 2017-03-16 MED ORDER — PANTOPRAZOLE SODIUM 40 MG PO PACK
40.0000 mg | PACK | Freq: Every day | ORAL | Status: DC
  Administered 2017-03-17: 40 mg via ORAL
  Filled 2017-03-16: qty 20

## 2017-03-16 MED ORDER — ESOMEPRAZOLE MAGNESIUM 40 MG PO PACK: 40.0000 mg | PACK | Freq: Every day | ORAL | Status: DC

## 2017-03-16 MED ORDER — CEFAZOLIN SODIUM-DEXTROSE 1-4 GM/50ML-% IV SOLN
1.0000 g | Freq: Four times a day (QID) | INTRAVENOUS | Status: AC
  Administered 2017-03-16 – 2017-03-17 (×3): 1 g via INTRAVENOUS
  Filled 2017-03-16 (×3): qty 50

## 2017-03-16 MED ORDER — HYDROCODONE-ACETAMINOPHEN 10-325 MG PO TABS
1.0000 | ORAL_TABLET | ORAL | Status: DC | PRN
  Administered 2017-03-17 (×3): 1 via ORAL
  Filled 2017-03-16 (×3): qty 1

## 2017-03-16 MED ORDER — IOPAMIDOL (ISOVUE-370) INJECTION 76%
INTRAVENOUS | Status: DC | PRN
  Administered 2017-03-16: 45 mL via INTRAVENOUS

## 2017-03-16 MED ORDER — MIDAZOLAM HCL 5 MG/5ML IJ SOLN
INTRAMUSCULAR | Status: AC
  Filled 2017-03-16: qty 5

## 2017-03-16 MED ORDER — SACUBITRIL-VALSARTAN 97-103 MG PO TABS
1.0000 | ORAL_TABLET | Freq: Two times a day (BID) | ORAL | Status: DC
  Administered 2017-03-16 – 2017-03-17 (×2): 1 via ORAL
  Filled 2017-03-16 (×2): qty 1

## 2017-03-16 MED ORDER — POTASSIUM CHLORIDE CRYS ER 20 MEQ PO TBCR
20.0000 meq | EXTENDED_RELEASE_TABLET | Freq: Every day | ORAL | Status: DC
  Administered 2017-03-16: 20 meq via ORAL
  Filled 2017-03-16 (×2): qty 1

## 2017-03-16 MED ORDER — ASPIRIN 81 MG PO CHEW
81.0000 mg | CHEWABLE_TABLET | Freq: Every day | ORAL | Status: DC
  Administered 2017-03-17: 81 mg via ORAL
  Filled 2017-03-16: qty 1

## 2017-03-16 MED ORDER — ZOLPIDEM TARTRATE 5 MG PO TABS
5.0000 mg | ORAL_TABLET | Freq: Every day | ORAL | Status: DC
  Administered 2017-03-16: 5 mg via ORAL
  Filled 2017-03-16: qty 1

## 2017-03-16 MED ORDER — HEPARIN (PORCINE) IN NACL 2-0.9 UNIT/ML-% IJ SOLN
INTRAMUSCULAR | Status: AC | PRN
  Administered 2017-03-16: 500 mL

## 2017-03-16 MED ORDER — ONDANSETRON HCL 4 MG/2ML IJ SOLN: 4.0000 mg | Freq: Four times a day (QID) | INTRAMUSCULAR | Status: DC | PRN

## 2017-03-16 MED ORDER — FENTANYL CITRATE (PF) 100 MCG/2ML IJ SOLN
INTRAMUSCULAR | Status: DC | PRN
  Administered 2017-03-16 (×3): 25 ug via INTRAVENOUS
  Administered 2017-03-16: 50 ug via INTRAVENOUS

## 2017-03-16 MED ORDER — SODIUM CHLORIDE 0.9 % IR SOLN
Status: AC
  Filled 2017-03-16: qty 2

## 2017-03-16 MED ORDER — SODIUM CHLORIDE 0.9 % IR SOLN
80.0000 mg | Status: AC
  Administered 2017-03-16: 80 mg
  Filled 2017-03-16: qty 2

## 2017-03-16 MED ORDER — NICOTINE POLACRILEX 2 MG MT GUM
2.0000 mg | CHEWING_GUM | OROMUCOSAL | Status: DC | PRN
  Filled 2017-03-16: qty 1

## 2017-03-16 MED ORDER — CEFAZOLIN SODIUM-DEXTROSE 2-4 GM/100ML-% IV SOLN
2.0000 g | INTRAVENOUS | Status: AC
  Administered 2017-03-16: 2 g via INTRAVENOUS
  Filled 2017-03-16: qty 100

## 2017-03-16 MED ORDER — BISOPROLOL FUMARATE 5 MG PO TABS
5.0000 mg | ORAL_TABLET | Freq: Every day | ORAL | Status: DC
  Administered 2017-03-17: 5 mg via ORAL
  Filled 2017-03-16 (×2): qty 1

## 2017-03-16 MED ORDER — LIDOCAINE HCL (PF) 1 % IJ SOLN
INTRAMUSCULAR | Status: DC | PRN
  Administered 2017-03-16: 45 mL

## 2017-03-16 MED ORDER — MIDAZOLAM HCL 5 MG/5ML IJ SOLN
INTRAMUSCULAR | Status: DC | PRN
  Administered 2017-03-16: 2 mg via INTRAVENOUS
  Administered 2017-03-16 (×3): 1 mg via INTRAVENOUS

## 2017-03-16 MED ORDER — SODIUM CHLORIDE 0.9 % IV SOLN: INTRAVENOUS | Status: AC

## 2017-03-16 MED ORDER — CLOPIDOGREL BISULFATE 75 MG PO TABS
75.0000 mg | ORAL_TABLET | Freq: Every day | ORAL | Status: DC
  Administered 2017-03-17: 75 mg via ORAL
  Filled 2017-03-16: qty 1

## 2017-03-16 MED ORDER — MUPIROCIN 2 % EX OINT
TOPICAL_OINTMENT | CUTANEOUS | Status: AC
  Administered 2017-03-16: 1 via TOPICAL
  Filled 2017-03-16: qty 22

## 2017-03-16 MED ORDER — IOPAMIDOL (ISOVUE-370) INJECTION 76%
INTRAVENOUS | Status: AC
  Filled 2017-03-16: qty 50

## 2017-03-16 MED ORDER — ROSUVASTATIN CALCIUM 20 MG PO TABS
20.0000 mg | ORAL_TABLET | Freq: Every day | ORAL | Status: DC
  Administered 2017-03-16: 20 mg via ORAL
  Filled 2017-03-16: qty 1

## 2017-03-16 MED ORDER — LIDOCAINE HCL (PF) 1 % IJ SOLN
INTRAMUSCULAR | Status: AC
  Filled 2017-03-16: qty 60

## 2017-03-16 SURGICAL SUPPLY — 17 items
CABLE SURGICAL S-101-97-12 (CABLE) ×2 IMPLANT
CATH ATTAIN SEL SURV 6248V-90 (CATHETERS) ×2 IMPLANT
CATH CPS DIRECT 135 DS2C020 (CATHETERS) ×2 IMPLANT
HEMOSTAT SURGICEL 2X4 FIBR (HEMOSTASIS) ×2 IMPLANT
ICD CLARIA MRI DTMA1QQ (ICD Generator) ×2 IMPLANT
KIT ESSENTIALS PG (KITS) ×2 IMPLANT
LEAD CAPSURE NOVUS 5076-52CM (Lead) ×2 IMPLANT
LEAD QUARTET 1458Q-86CM (Lead) ×2 IMPLANT
LEAD SPRINT QUAT SEC 6935M-62 (Lead) ×2 IMPLANT
PAD DEFIB LIFELINK (PAD) ×2 IMPLANT
SHEATH CLASSIC 7F (SHEATH) ×2 IMPLANT
SHEATH CLASSIC 9.5F (SHEATH) ×2 IMPLANT
SHEATH CLASSIC 9F (SHEATH) ×2 IMPLANT
SLITTER UNIVERSAL DS2A003 (MISCELLANEOUS) ×2 IMPLANT
TRAY PACEMAKER INSERTION (PACKS) ×2 IMPLANT
WIRE ACUITY WHISPER EDS 4648 (WIRE) ×4 IMPLANT
WIRE HI TORQ VERSACORE-J 145CM (WIRE) ×4 IMPLANT

## 2017-03-16 NOTE — Progress Notes (Signed)
Patient admitted to [phone] from [facility] via stretcher.  Bed in low position, wheels locked.  Patient denies pain/shortness of breath.  Telemetry monitor applied.  Patient oriented to environment, including call bell, TV, meal times, and hourly rounding.  Dressing to left chest (s/p BIV-ICD insertion) with shadow of blood outlined in the Cath Lab.  Sling to left arm.

## 2017-03-16 NOTE — Discharge Summary (Signed)
ELECTROPHYSIOLOGY PROCEDURE DISCHARGE SUMMARY    Patient ID: Miguel Pena,  MRN: [id number], DOB/AGE: 1965-08-09 52 y.o.  Admit date: 03/16/2017 Discharge date: 03/17/17 Primary Care Physician: [name], MD  Primary Cardiologist: Dr. Virgina Jock Electrophysiologist: Dr. [name]  Primary Discharge Diagnosis:  1. NICM 2. LBBB  Secondary Discharge Diagnosis:  1. Chronic CHF (systolic) 2. HLD 3. CAD  No Known Allergies   Procedures This Admission:  1.  Implantation of a MDT CRT-D on 03/16/17 by Dr [name].  The patient received a Medtronic MRI compatible Claria ICD, serial number [id number], Medtronic MRI compatible 1594 single coil active fixation defibrillator serial number [id number], Medtronic MRI compatible 5076 active fixation atrial lead, serial number [id number], Saint Jude 1458-lead BPP [id number] LV lead DFT's were deferred at time of implant.   There were no immediate post procedure complications. 2.  CXR on 03/17/17 demonstrated no pneumothorax status post device implantation.   Brief HPI: Miguel Pena is a 52 y.o. male was referred to electrophysiology in the outpatient setting for consideration of ICD implantation.  Past medical history is noted above.  The patient has persistent LV dysfunction despite guideline directed therapy.  Risks, benefits, and alternatives to ICD implantation were reviewed with the patient who wished to proceed.   Hospital Course:  The patient was admitted and underwent implantation of a CRT-D with details as outlined above. He was monitored on telemetry overnight which demonstrated P-synchronous/ AV  pacing .  Left chest was without hematoma or ecchymosis.  The device was interrogated and found to be functioning normally.  CXR was obtained and demonstrated no pneumothorax status post device implantation.  Wound care, arm mobility, and restrictions were reviewed with the patient.  The patient was examined by Dr. [name] and considered  stable for discharge to home.   The patient's discharge medications include an ARB Delene Loll) and beta blocker (Bisoprolol).   Physical Exam: Vitals:   03/16/17 1445 03/16/17 1535 03/16/17 2147 03/17/17 0607  BP: 98/60 (!) 116/55 (!) 112/59 (!) 97/58  Pulse: 70 (!) 59 (!) 59 (!) 59  Resp: 13  16 17   Temp: 98.2 F (36.8 C)  98 F (36.7 C) 97.6 F (36.4 C)  TempSrc: Oral  Oral Oral  SpO2: 97% 100% 100% 99%  Weight:    188 lb 12.8 oz (85.6 kg)  Height:        GEN- The patient is well appearing, alert and oriented x 3 today.   HEENT: normocephalic, atraumatic; sclera clear, conjunctiva pink; hearing intact; oropharynx clear Lungs- CTA b/l, normal work of breathing.  No wheezes, rales, rhonchi Heart-  RRR, no murmurs, rubs or gallops, PMI not laterally displaced GI- soft, non-tender, non-distended Extremities- no clubbing, cyanosis, or edema MS- no significant deformity or atrophy Skin- warm and dry, no rash or lesion, left chest without hematoma/ecchymosis Psych- euthymic mood, full affect Neuro- no gross defecits  Labs:   Lab Results  Component Value Date   WBC 5.2 03/16/2017   HGB 10.3 (L) 03/16/2017   HCT 30.7 (L) 03/16/2017   MCV 89.0 03/16/2017   PLT 188 03/16/2017    Recent Labs  Lab 03/16/17 0619  NA 139  K 4.2  CL 107  CO2 23  BUN 9  CREATININE 1.05  CALCIUM 9.7  GLUCOSE 110*    Discharge Medications:  Allergies as of 03/17/2017   No Known Allergies     Medication List    TAKE these medications   aspirin 81  MG chewable tablet Chew 1 tablet (81 mg total) by mouth daily.   bisoprolol 5 MG tablet Commonly known as:  ZEBETA Take 1 tablet (5 mg total) by mouth daily.   clopidogrel 75 MG tablet Commonly known as:  PLAVIX Take 1 tablet (75 mg total) by mouth daily.   esomeprazole 40 MG packet Commonly known as:  NEXIUM Take 40 mg by mouth daily before breakfast.   furosemide 40 MG tablet Commonly known as:  LASIX Take 1 tablet (40 mg total)  by mouth as needed for edema. What changed:  when to take this   HYDROcodone-acetaminophen 10-325 MG tablet Commonly known as:  NORCO Take 1 tablet by mouth every 4 (four) hours as needed for moderate pain.   hydrOXYzine 100 MG capsule Commonly known as:  VISTARIL Take 100 mg by mouth at bedtime.   Na Sulfate-K Sulfate-Mg Sulf 17.5-3.13-1.6 GM/177ML Soln Take 1 kit by mouth as directed. Notes to patient:  Please refer to original prescribing doctor regarding this    nicotine polacrilex 2 MG gum Commonly known as:  NICORETTE Take 2 mg by mouth as needed for smoking cessation.   potassium chloride SA 20 MEQ tablet Commonly known as:  K-DUR,KLOR-CON Take 1 tablet (20 mEq total) by mouth daily.   rosuvastatin 20 MG tablet Commonly known as:  CRESTOR Take 1 tablet (20 mg total) by mouth daily at 6 PM.   sacubitril-valsartan 97-103 MG Commonly known as:  ENTRESTO Take 1 tablet by mouth 2 (two) times daily.   spironolactone 25 MG tablet Commonly known as:  ALDACTONE Take 25 mg by mouth daily.   zolpidem 5 MG tablet Commonly known as:  AMBIEN Take 5 mg by mouth at bedtime.       Disposition:  Home  Discharge Instructions    Diet - low sodium heart healthy   Complete by:  As directed    Diet - low sodium heart healthy   Complete by:  As directed    Increase activity slowly   Complete by:  As directed    Increase activity slowly   Complete by:  As directed      Follow-up Information    [facility] Follow up on 03/23/2017.   Specialty:  Cardiology Why:  2:30PM, wound check visit Contact information: [address], [address] [place]       Deboraha Sprang, MD Follow up on 06/16/2017.   Specialty:  Cardiology Why:  3:15PM Contact information: [address] [address] [phone]           Duration of Discharge Encounter: Greater than 30 minutes including  physician time.  Venetia Night, PA-C 03/17/2017 10:34 AM  OK for discharge  Continue current meds  CXR reviewed and device interrogation ok  Spoke with GI and they will reach out to him regarding hemorrhoidectomy

## 2017-03-16 NOTE — Interval H&P Note (Signed)
ICD Criteria  Current LVEF:20%. Within 12 months prior to implant: Yes   Heart failure history: Yes, Class II  Cardiomyopathy history: Yes, Non-Ischemic Cardiomyopathy.  Atrial Fibrillation/Atrial Flutter: No.  Ventricular tachycardia history: No.  Cardiac arrest history: No.  History of syndromes with risk of sudden death: No.  Previous ICD: No.  Current ICD indication: Primary  PPM indication: Yes. Pacing type: Ventricular. Greater than 40% RV pacing requirement anticipated. Indication: { CRT  Class I or II Bradycardia indication present: No  Beta Blocker therapy for 3 or more months: Yes, prescribed.   Ace Inhibitor/ARB therapy for 3 or more months: Yes, prescribed.   History and Physical Interval Note:  03/16/2017 7:34 AM  Miguel Pena  has presented today for surgery, with the diagnosis of left bundle  The various methods of treatment have been discussed with the patient and family. After consideration of risks, benefits and other options for treatment, the patient has consented to  Procedure(s): BIV ICD INSERTION CRT-D (N/A) as a surgical intervention .  The patient's history has been reviewed, patient examined, no change in status, stable for surgery.  I have reviewed the patient's chart and labs.  Questions were answered to the patient's satisfaction.     Virl Axe

## 2017-03-16 NOTE — Discharge Instructions (Signed)
° ° °  Supplemental Discharge Instructions for  Pacemaker/Defibrillator Patients  Activity No heavy lifting or vigorous activity with your left/right arm for 6 to 8 weeks.  Do not raise your left/right arm above your head for one week.  Gradually raise your affected arm as drawn below.              03/20/17                    03/21/17                    03/22/17                   03/23/17 __  NO DRIVING for   1week  ; you may begin driving on  7/61/60 .  WOUND CARE - Keep the wound area clean and dry.  Do not get this area wet for one week. No showers for one week; you may shower on  03/23/17   . - The tape/steri-strips on your wound will fall off; do not pull them off.  No bandage is needed on the site.  DO  NOT apply any creams, oils, or ointments to the wound area. - If you notice any drainage or discharge from the wound, any swelling or bruising at the site, or you develop a fever > 101? F after you are discharged home, call the office at once.  Special Instructions - You are still able to use cellular telephones; use the ear opposite the side where you have your pacemaker/defibrillator.  Avoid carrying your cellular phone near your device. - When traveling through airports, show security personnel your identification card to avoid being screened in the metal detectors.  Ask the security personnel to use the hand wand. - Avoid arc welding equipment, MRI testing (magnetic resonance imaging), TENS units (transcutaneous nerve stimulators).  Call the office for questions about other devices. - Avoid electrical appliances that are in poor condition or are not properly grounded. - Microwave ovens are safe to be near or to operate.  Additional information for defibrillator patients should your device go off: - If your device goes off ONCE and you feel fine afterward, notify the device clinic nurses. - If your device goes off ONCE and you do not feel well afterward, call 911. - If your device goes  off TWICE, call 911. - If your device goes off THREE times in one day, call 911.  DO NOT DRIVE YOURSELF OR A FAMILY MEMBER WITH A DEFIBRILLATOR TO THE HOSPITAL--CALL 911.

## 2017-03-17 ENCOUNTER — Ambulatory Visit (HOSPITAL_COMMUNITY): Payer: 59

## 2017-03-17 ENCOUNTER — Encounter (HOSPITAL_COMMUNITY): Payer: Self-pay | Admitting: *Deleted

## 2017-03-17 DIAGNOSIS — I429 Cardiomyopathy, unspecified: Secondary | ICD-10-CM | POA: Diagnosis not present

## 2017-03-17 DIAGNOSIS — I428 Other cardiomyopathies: Secondary | ICD-10-CM | POA: Diagnosis not present

## 2017-03-17 NOTE — Progress Notes (Signed)
Discharge Progress Report  Patient Details  Name: Miguel Pena MRN: [id number] Date of Birth: 08-18-1965 Referring Provider:     CARDIAC REHAB PHASE II ORIENTATION from 11/18/2016 in [facility]  Referring Provider  Vernell Leep, MD.       Number of Visits: 24 Reason for Discharge:  Patient reached a stable level of exercise.  Smoking History:  Social History   Tobacco Use  Smoking Status Former Smoker  . Packs/day: 1.00  . Years: 12.00  . Pack years: 12.00  . Types: Cigarettes, Cigars  . Last attempt to quit: 02/16/2011  . Years since quitting: 6.0  Smokeless Tobacco Former Systems developer  . Types: Snuff  Tobacco Comment   10/28/2016 'chewed for a couple years after I quit smoking"    Diagnosis:  Stented coronary artery  ADL [facility]SD:   Initial Exercise Prescription: Initial Exercise Prescription - 11/18/16 1500      Date of Initial Exercise RX and Referring Provider   Date  11/18/16    Referring Provider  Vernell Leep, MD.      Treadmill   MPH  2.6    Grade  1    Minutes  10    METs  3.35      Bike   Level  1.6    Minutes  10    METs  4.55      NuStep   Level  4    SPM  85    Minutes  10    METs  3      Prescription Details   Frequency (times per week)  3    Duration  Progress to 30 minutes of continuous aerobic without signs/symptoms of physical distress      Intensity   THRR 40-80% of Max Heartrate  67-134    Ratings of Perceived Exertion  11-13    Perceived Dyspnea  0-4      Progression   Progression  Continue to progress workloads to maintain intensity without signs/symptoms of physical distress.      Resistance Training   Training Prescription  Yes    Weight  4lbs    Reps  10-15       Discharge Exercise Prescription (Final Exercise Prescription Changes): Exercise Prescription Changes - 03/13/17 1000      Response to Exercise   Blood Pressure (Admit)  100/60    Blood Pressure (Exercise)  140/70     Blood Pressure (Exit)  98/60    Heart Rate (Admit)  71 bpm    Heart Rate (Exercise)  117 bpm    Heart Rate (Exit)  76 bpm    Rating of Perceived Exertion (Exercise)  14    Symptoms  none    Duration  Continue with 30 min of aerobic exercise without signs/symptoms of physical distress.    Intensity  THRR unchanged      Progression   Progression  Continue to progress workloads to maintain intensity without signs/symptoms of physical distress.    Average METs  4.9      Resistance Training   Training Prescription  Yes    Weight  5lb    Reps  10-15    Time  10 Minutes      Treadmill   MPH  3.2    Grade  2    Minutes  15    METs  4.33      Rower   Level  4    Watts  49  Minutes  15    METs  5.5      Home Exercise Plan   Plans to continue exercise at  Home (comment) Walking     Frequency  Add 3 additional days to program exercise sessions.    Initial Home Exercises Provided  12/05/16       Functional Capacity: 6 Minute Walk    Row Name 11/18/16 1547 02/27/17 1553 03/13/17 1012     6 Minute Walk   Phase  Initial  Discharge  -   Distance  1488 feet  1800 feet  -   Distance % Change  -  -  20.97 %   Distance Feet Change  -  -  312 ft   Walk Time  6 minutes  6 minutes  -   # of Rest Breaks  0  0  -   MPH  2.82  3.4  -   METS  4.47  4.7  -   RPE  11  13  -   VO2 Peak  15.65  16.6  -   Symptoms  No  No  -   Resting HR  95 bpm  88 bpm  -   Resting BP  122/60  102/58  -   Resting Oxygen Saturation   97 %  -  -   Exercise Oxygen Saturation  during 6 min walk  96 %  -  -   Max Ex. HR  119 bpm  126 bpm  -   Max Ex. BP  132/70  98/60  -   2 Minute Post BP  120/63  92/60  -      Psychological, QOL, Others - Outcomes: PHQ 2/9: Depression screen [facility] 2/9 03/06/2017 11/21/2016 09/30/2016 09/25/2016 09/22/2016  Decreased Interest 0 0 0 0 0  Down, Depressed, Hopeless 0 0 0 0 0  PHQ - 2 Score 0 0 0 0 0    Quality of Life: Quality of Life - 03/04/17 1442      Quality of  Life Scores   Health/Function Pre  26.8 %    Health/Function Post  17.77 %    Health/Function % Change  -33.69 %    Socioeconomic Pre  27.43 %    Socioeconomic Post  27.43 %    Socioeconomic % Change   0 %    Psych/Spiritual Pre  30 %    Psych/Spiritual Post  27.43 %    Psych/Spiritual % Change  -8.57 %    Family Pre  28.8 %    Family Post  27.6 %    Family % Change  -4.17 %    GLOBAL Pre  27.88 %    GLOBAL Post  23.19 %    GLOBAL % Change  -16.82 %       Personal Goals: Goals established at orientation with interventions provided to work toward goal. Personal Goals and Risk Factors at Admission - 11/18/16 1441      Core Components/Risk Factors/Patient Goals on Admission    Weight Management  Yes;Weight Maintenance    Intervention  Weight Management: Develop a combined nutrition and exercise program designed to reach desired caloric intake, while maintaining appropriate intake of nutrient and fiber, sodium and fats, and appropriate energy expenditure required for the weight goal.;Weight Management: Provide education and appropriate resources to help participant work on and attain dietary goals.;Weight Management/Obesity: Establish reasonable short term and long term weight goals.    Expected Outcomes  Short Term: Continue to  assess and modify interventions until short term weight is achieved;Weight Maintenance: Understanding of the daily nutrition guidelines, which includes 25-35% calories from fat, 7% or less cal from saturated fats, less than 26m cholesterol, less than 1.5gm of sodium, & 5 or more servings of fruits and vegetables daily        Personal Goals Discharge: Goals and Risk Factor Review    Row Name 12/05/16 1708 01/02/17 1233 01/23/17 1633 02/26/17 1211 03/06/17 1418     Core Components/Risk Factors/Patient Goals Review   Personal Goals Review  Other;Weight Management/Obesity  Other;Weight Management/Obesity  Other;Weight Management/Obesity  Other;Weight  Management/Obesity  Other;Weight Management/Obesity   Review  pt with few CAD RF demonstrates eagerness to participate in CR opportunities.   pt with few CAD RF demonstrates eagerness to participate in CR opportunities.   pt with few CAD RF demonstrates eagerness to participate in CR opportunities. pt looking forward to repeat ECHO with hope for mproved results.    pt plans to complete CR program in a few more sessions. pt would like to complete program prior to scheduled ICD implant.    pt completed CR program with 24 sessions. pt with scheduled colonscopy and ICD implant. pt plans to exercise on his own.  pt pleased with his increased strength/endurance.     Expected Outcomes  pt will participate in CR exercise, nutrition and lifestyle modification to prevent disease progression.   pt will participate in CR exercise, nutrition and lifestyle modification to prevent disease progression.   pt will participate in CR exercise, nutrition and lifestyle modification to prevent disease progression.   pt will participate in CR exercise, nutrition and lifestyle modification to prevent disease progression.   pt will participate in CR exercise, nutrition and lifestyle modification to prevent disease progression.    RTylerName 03/17/17 1037             Core Components/Risk Factors/Patient Goals Review   Review  -       Expected Outcomes  -          Exercise Goals and Review: Exercise Goals    Row Name 11/18/16 1551             Exercise Goals   Increase Physical Activity  Yes       Intervention  Provide advice, education, support and counseling about physical activity/exercise needs.;Develop an individualized exercise prescription for aerobic and resistive training based on initial evaluation findings, risk stratification, comorbidities and participant's personal goals.       Expected Outcomes  Achievement of increased cardiorespiratory fitness and enhanced flexibility, muscular endurance and strength  shown through measurements of functional capacity and personal statement of participant.       Increase Strength and Stamina  Yes       Intervention  Provide advice, education, support and counseling about physical activity/exercise needs.;Develop an individualized exercise prescription for aerobic and resistive training based on initial evaluation findings, risk stratification, comorbidities and participant's personal goals.       Expected Outcomes  Achievement of increased cardiorespiratory fitness and enhanced flexibility, muscular endurance and strength shown through measurements of functional capacity and personal statement of participant.       Able to understand and use rate of perceived exertion (RPE) scale  Yes       Intervention  Provide education and explanation on how to use RPE scale       Expected Outcomes  Short Term: Able to use RPE daily in rehab to express subjective  intensity level;Long Term:  Able to use RPE to guide intensity level when exercising independently       Knowledge and understanding of Target Heart Rate Range (THRR)  Yes       Intervention  Provide education and explanation of THRR including how the numbers were predicted and where they are located for reference       Expected Outcomes  Short Term: Able to state/look up THRR;Long Term: Able to use THRR to govern intensity when exercising independently;Short Term: Able to use daily as guideline for intensity in rehab       Able to check pulse independently  Yes       Intervention  Provide education and demonstration on how to check pulse in carotid and radial arteries.;Review the importance of being able to check your own pulse for safety during independent exercise       Expected Outcomes  Long Term: Able to check pulse independently and accurately;Short Term: Able to explain why pulse checking is important during independent exercise       Understanding of Exercise Prescription  Yes       Intervention  Provide  education, explanation, and written materials on patient's individual exercise prescription       Expected Outcomes  Short Term: Able to explain program exercise prescription;Long Term: Able to explain home exercise prescription to exercise independently          Nutrition & Weight - Outcomes: Pre Biometrics - 11/18/16 1552      Pre Biometrics   Height  5' 10"  (1.778 m)    Weight  189 lb 13.1 oz (86.1 kg)    Waist Circumference  39 inches    Hip Circumference  39.75 inches    Waist to Hip Ratio  0.98 %    BMI (Calculated)  27.24    Triceps Skinfold  14 mm    % Body Fat  25.9 %    Grip Strength  40.5 kg    Flexibility  11 in    Single Leg Stand  5.5 seconds      Post Biometrics - 02/27/17 1557       Post  Biometrics   Height  5' 10"  (1.778 m)    Weight  192 lb 0.3 oz (87.1 kg)    Waist Circumference  40.5 inches    Hip Circumference  39.25 inches    Waist to Hip Ratio  1.03 %    BMI (Calculated)  27.55    Triceps Skinfold  14 mm    % Body Fat  26.7 %    Grip Strength  49 kg    Flexibility  13 in    Single Leg Stand  30 seconds       Nutrition: Nutrition Therapy & Goals - 03/13/17 1343      Nutrition Therapy   Diet  Heart Healthy      Personal Nutrition Goals   Nutrition Goal  Pt to identify and limit food sources of saturated fat, trans fat, and sodium      Intervention Plan   Intervention  Prescribe, educate and counsel regarding individualized specific dietary modifications aiming towards targeted core components such as weight, hypertension, lipid management, diabetes, heart failure and other comorbidities.    Expected Outcomes  Short Term Goal: Understand basic principles of dietary content, such as calories, fat, sodium, cholesterol and nutrients.;Long Term Goal: Adherence to prescribed nutrition plan.       Nutrition Discharge: Nutrition Assessments - 03/13/17  1343      MEDFICTS Scores   Pre Score  83       Education Questionnaire Score: Knowledge  Questionnaire Score - 03/05/17 1356      Knowledge Questionnaire Score   Post Score  27/28       Goals reviewed with patient; copy given to patient.

## 2017-03-19 ENCOUNTER — Telehealth: Payer: Self-pay

## 2017-03-19 NOTE — Telephone Encounter (Signed)
-----   Message from Mauri Pole, MD sent at 03/18/2017  1:44 PM EDT ----- Madaline Brilliant thanks ----- Message ----- From: Greggory Keen, LPN Sent: 1/65/7903  12:31 PM To: Mauri Pole, MD  Visit with an APP? ----- Message ----- From: Mauri Pole, MD Sent: 03/18/2017  12:01 PM To: Deboraha Sprang, MD, Greggory Keen, LPN  [name], can you please follow up and bring patient in for visit, will likely need referral to colorectal for hemorrhoidectomy , due to  rectal bleeding Thanks VN  ----- Message ----- From: Deboraha Sprang, MD Sent: 03/16/2017   2:58 PM To: Mauri Pole, MD  He is in hospitall  I will tell him and maybe your office can followup with him tomorrow or Wednesday  TRHANKS FOR YOUR VERY PROMPT RESPONSE  ----- Message ----- From: Mauri Pole, MD Sent: 03/16/2017   1:01 PM To: Deboraha Sprang, MD, Greggory Keen, LPN  Called patient, didn't reach him. Will try to contact patient again later today. He will likely need hemorrhoidectomy if he is having significant bleeding Thanks Veena Nandigam ----- Message ----- From: Deboraha Sprang, MD Sent: 03/16/2017   7:33 AM To: Mauri Pole, MD  Good am  You did colonoscopy on this pt last week  His Hgb is down and his hemorrhoids are bleeding   What would be his next step Thanks steve klein

## 2017-03-19 NOTE — Telephone Encounter (Signed)
Left a message on his voicemail requesting he call back and we will schedule him an appointment to help with the hemorrhoids.

## 2017-03-23 ENCOUNTER — Ambulatory Visit (INDEPENDENT_AMBULATORY_CARE_PROVIDER_SITE_OTHER): Payer: 59 | Admitting: *Deleted

## 2017-03-23 DIAGNOSIS — I428 Other cardiomyopathies: Secondary | ICD-10-CM | POA: Diagnosis not present

## 2017-03-23 LAB — CUP PACEART INCLINIC DEVICE CHECK
Battery Remaining Longevity: 95 mo
Battery Voltage: 3.08 V
Brady Statistic AS VS Percent: 0.05 %
HighPow Impedance: 57 Ohm
Implantable Lead Implant Date: 20190311
Implantable Lead Implant Date: 20190311
Implantable Lead Location: 753859
Implantable Lead Model: 5076
Implantable Pulse Generator Implant Date: 20190311
Lead Channel Impedance Value: 1102 Ohm
Lead Channel Impedance Value: 185.725
Lead Channel Impedance Value: 323 Ohm
Lead Channel Impedance Value: 437 Ohm
Lead Channel Impedance Value: 437 Ohm
Lead Channel Impedance Value: 551 Ohm
Lead Channel Impedance Value: 665 Ohm
Lead Channel Impedance Value: 779 Ohm
Lead Channel Pacing Threshold Amplitude: 0.75 V
Lead Channel Pacing Threshold Amplitude: 1.5 V
Lead Channel Pacing Threshold Pulse Width: 0.4 ms
Lead Channel Pacing Threshold Pulse Width: 0.4 ms
Lead Channel Pacing Threshold Pulse Width: 0.4 ms
Lead Channel Sensing Intrinsic Amplitude: 15.125 mV
Lead Channel Sensing Intrinsic Amplitude: 16.5 mV
Lead Channel Sensing Intrinsic Amplitude: 2.875 mV
Lead Channel Setting Pacing Amplitude: 3.25 V
Lead Channel Setting Pacing Amplitude: 3.5 V
Lead Channel Setting Pacing Pulse Width: 0.4 ms
MDC IDC LEAD IMPLANT DT: 20190311
MDC IDC LEAD LOCATION: 753858
MDC IDC LEAD LOCATION: 753860
MDC IDC MSMT LEADCHNL LV IMPEDANCE VALUE: 1007 Ohm
MDC IDC MSMT LEADCHNL LV IMPEDANCE VALUE: 203.63 Ohm
MDC IDC MSMT LEADCHNL LV IMPEDANCE VALUE: 217.404
MDC IDC MSMT LEADCHNL LV IMPEDANCE VALUE: 263.707
MDC IDC MSMT LEADCHNL LV IMPEDANCE VALUE: 301.328
MDC IDC MSMT LEADCHNL LV IMPEDANCE VALUE: 665 Ohm
MDC IDC MSMT LEADCHNL LV IMPEDANCE VALUE: 779 Ohm
MDC IDC MSMT LEADCHNL LV IMPEDANCE VALUE: 988 Ohm
MDC IDC MSMT LEADCHNL RA PACING THRESHOLD AMPLITUDE: 0.625 V
MDC IDC MSMT LEADCHNL RA SENSING INTR AMPL: 2.125 mV
MDC IDC MSMT LEADCHNL RV IMPEDANCE VALUE: 323 Ohm
MDC IDC MSMT LEADCHNL RV IMPEDANCE VALUE: 399 Ohm
MDC IDC SESS DTM: 20190318151142
MDC IDC SET LEADCHNL RV PACING AMPLITUDE: 3.5 V
MDC IDC SET LEADCHNL RV PACING PULSEWIDTH: 0.4 ms
MDC IDC SET LEADCHNL RV SENSING SENSITIVITY: 0.3 mV
MDC IDC STAT BRADY AP VP PERCENT: 21 %
MDC IDC STAT BRADY AP VS PERCENT: 0.19 %
MDC IDC STAT BRADY AS VP PERCENT: 78.75 %
MDC IDC STAT BRADY RA PERCENT PACED: 21.19 %
MDC IDC STAT BRADY RV PERCENT PACED: 99.67 %

## 2017-03-23 NOTE — Telephone Encounter (Signed)
Patient declines an appointment. He has not seen any further bleeding.

## 2017-03-23 NOTE — Progress Notes (Signed)
Wound CRT-D device check in office. Steri-Strips removed, incision edges approximated, no redness or edema wound well healed. Thresholds and sensing consistent implant. No mode switch episodes recorded. No ventricular arrhythmia episodes recorded. Patient bi-ventricularly pacing 99.5 % of the time. Device programmed at 3.5V for extra safety margin until 91 day f/u. Audible alerts demonstrated for patient. No changes made this session. Estimated longevity 7.9 years .  Pt educated about wound care, arm mobility, lifting restrictions and shock plan. ROV w/ SK 06/16/17

## 2017-03-31 ENCOUNTER — Ambulatory Visit: Payer: 59 | Admitting: Internal Medicine

## 2017-04-06 ENCOUNTER — Encounter (HOSPITAL_COMMUNITY): Payer: Self-pay | Admitting: Cardiology

## 2017-05-19 ENCOUNTER — Encounter: Payer: 59 | Admitting: Internal Medicine

## 2017-06-16 ENCOUNTER — Encounter: Payer: 59 | Admitting: Internal Medicine

## 2018-03-04 DIAGNOSIS — Z950 Presence of cardiac pacemaker: Secondary | ICD-10-CM

## 2018-03-04 DIAGNOSIS — I447 Left bundle-branch block, unspecified: Secondary | ICD-10-CM | POA: Diagnosis not present

## 2018-03-04 DIAGNOSIS — Z45018 Encounter for adjustment and management of other part of cardiac pacemaker: Secondary | ICD-10-CM

## 2018-03-07 ENCOUNTER — Telehealth: Payer: Self-pay | Admitting: Cardiology

## 2018-03-07 ENCOUNTER — Other Ambulatory Visit: Payer: Self-pay | Admitting: Cardiology

## 2018-03-09 ENCOUNTER — Other Ambulatory Visit: Payer: Self-pay

## 2018-03-09 MED ORDER — SACUBITRIL-VALSARTAN 97-103 MG PO TABS: 1.0000 | ORAL_TABLET | Freq: Two times a day (BID) | ORAL | 1 refills | Status: DC

## 2018-04-01 ENCOUNTER — Ambulatory Visit: Payer: Self-pay | Admitting: Cardiology

## 2018-04-04 DIAGNOSIS — Z4502 Encounter for adjustment and management of automatic implantable cardiac defibrillator: Secondary | ICD-10-CM

## 2018-04-04 DIAGNOSIS — Z95810 Presence of automatic (implantable) cardiac defibrillator: Secondary | ICD-10-CM | POA: Diagnosis not present

## 2018-04-04 DIAGNOSIS — I447 Left bundle-branch block, unspecified: Secondary | ICD-10-CM

## 2018-04-19 ENCOUNTER — Other Ambulatory Visit (HOSPITAL_COMMUNITY): Payer: Self-pay | Admitting: Cardiology

## 2018-04-20 LAB — BASIC METABOLIC PANEL
BUN/Creatinine Ratio: 9 (ref 9–20)
BUN: 9 mg/dL (ref 6–24)
CO2: 23 mmol/L (ref 20–29)
Calcium: 9.9 mg/dL (ref 8.7–10.2)
Chloride: 103 mmol/L (ref 96–106)
Creatinine, Ser: 0.95 mg/dL (ref 0.76–1.27)
GFR calc Af Amer: 104 mL/min/{1.73_m2} (ref >59)
GFR calc non Af Amer: 90 mL/min/{1.73_m2} (ref >59)
Glucose: 115 mg/dL — ABNORMAL HIGH (ref 65–99)
Potassium: 4.3 mmol/L (ref 3.5–5.2)
Sodium: 140 mmol/L (ref 134–144)

## 2018-04-22 ENCOUNTER — Encounter: Payer: Self-pay | Admitting: Cardiology

## 2018-04-22 ENCOUNTER — Ambulatory Visit (INDEPENDENT_AMBULATORY_CARE_PROVIDER_SITE_OTHER): Payer: 59 | Admitting: Cardiology

## 2018-04-22 ENCOUNTER — Other Ambulatory Visit: Payer: Self-pay

## 2018-04-22 VITALS — Ht 70.0 in | Wt 193.0 lb

## 2018-04-22 DIAGNOSIS — I428 Other cardiomyopathies: Secondary | ICD-10-CM | POA: Diagnosis not present

## 2018-04-22 DIAGNOSIS — I2510 Atherosclerotic heart disease of native coronary artery without angina pectoris: Secondary | ICD-10-CM | POA: Insufficient documentation

## 2018-04-22 NOTE — Progress Notes (Signed)
Virtual Visit via Video Note   Subjective:   Miguel Pena, male    DOB: 1965-11-28, 53 y.o.   MRN: [id number]   I connected with the patient on 04/22/18 by a video enabled telemedicine application and verified that I am speaking with the correct person using two identifiers.     I discussed the limitations of evaluation and management by telemedicine and the availability of in person appointments. The patient expressed understanding and agreed to proceed.   This visit type was conducted due to national recommendations for restrictions regarding the COVID-19 Pandemic (e.g. social distancing).  This format is felt to be most appropriate for this patient at this time.  All issues noted in this document were discussed and addressed.  No physical exam was performed (except for noted visual exam findings with Tele health visits).  The patient has consented to conduct a Tele health visit and understands insurance will be billed.     Chief complaint:  Cardiomyopathy  HPI  53 y/o Caucasian male with nonischemic dilated cardiomyopathy with now mormalization of LVEF post CRT-D placement 03/2017, single vessel CAD s/p successful PCI to OM1 with 3.5 X 16 mm DES 10/2016, hyperlipidemia, prior smoking history, testosterone deficiency.  He has been doing well. He does not have any exertional chest pain. He had one episode of retrosternal burning sensation, that resolved with drinking cold water. He has hague abdominal pain, only with lifting heavy weights. He denies leg edema, shortness of breath. He does not have his vitals today (out on COVID cleaning duty), but vitals were reportedly normal at visit with PCP 2 weeks ago.   Past Medical History:  Diagnosis Date   Bursitis of right shoulder    CHF (congestive heart failure) ([place])    [name] 10/24/2016   Dilated cardiomyopathy ([place])    [name] 10/24/2016   Dysrhythmia 1995   "VT" per patient- states had neg stress test and no problems since    Heart murmur    "when I was young"   History of kidney stones    Hypercholesterolemia      Past Surgical History:  Procedure Laterality Date   BIV ICD INSERTION CRT-D N/A 03/16/2017   Procedure: BIV ICD INSERTION CRT-D;  Surgeon: Deboraha Sprang, MD;  Location: [facility];  Service: Cardiovascular;  Laterality: N/A;   COLONOSCOPY WITH PROPOFOL N/A 03/10/2017   Procedure: COLONOSCOPY WITH PROPOFOL;  Surgeon: Mauri Pole, MD;  Location: [facility];  Service: Endoscopy;  Laterality: N/A;   CORONARY ANGIOPLASTY WITH STENT PLACEMENT  10/28/2016   CORONARY STENT INTERVENTION N/A 10/28/2016   Procedure: CORONARY STENT INTERVENTION;  Surgeon: [name], MD;  Location: [facility];  Service: Cardiovascular;  Laterality: N/A;   ESOPHAGOGASTRODUODENOSCOPY (EGD) WITH PROPOFOL N/A 03/10/2017   Procedure: ESOPHAGOGASTRODUODENOSCOPY (EGD) WITH PROPOFOL;  Surgeon: Mauri Pole, MD;  Location: [facility];  Service: Endoscopy;  Laterality: N/A;   INGUINAL HERNIA REPAIR Right 02/16/2013   Procedure: RIGHT INGUINAL HERNIA REPAIR ;  Surgeon: [name], MD;  Location: [facility];  Service: General;  Laterality: Right;  With MESH   INGUINAL HERNIA REPAIR Right 02/16/2013   INTRAVASCULAR PRESSURE WIRE/FFR STUDY N/A 10/28/2016   Procedure: INTRAVASCULAR PRESSURE WIRE/FFR STUDY;  Surgeon: [name], MD;  Location: [facility] [place];  Service: Cardiovascular;  Laterality: N/A;   RIGHT/LEFT HEART CATH AND CORONARY ANGIOGRAPHY N/A 10/28/2016   Procedure: RIGHT/LEFT HEART CATH AND CORONARY ANGIOGRAPHY;  Surgeon: [name],  MD;  Location: [facility];  Service: Cardiovascular;  Laterality: N/A;   ULTRASOUND GUIDANCE FOR VASCULAR ACCESS  10/28/2016   Procedure: Ultrasound Guidance For Vascular Access;  Surgeon: [name], MD;  Location: [facility];  Service: Cardiovascular;;     Social History   Socioeconomic  History   Marital status: Married    Spouse name: Not on file   Number of children: Not on file   Years of education: Not on file   Highest education level: Not on file  Occupational History   Not on file  Social Needs   Financial resource strain: Not on file   Food insecurity:    Worry: Not on file    Inability: Not on file   Transportation needs:    Medical: Not on file    Non-medical: Not on file  Tobacco Use   Smoking status: Former Smoker    Packs/day: 1.00    Years: 12.00    Pack years: 12.00    Types: Cigarettes, Cigars    Last attempt to quit: 02/16/2011    Years since quitting: 7.1   Smokeless tobacco: Former Systems developer    Types: Snuff   Tobacco comment: 10/28/2016 'chewed for a couple years after I quit smoking"  Substance and Sexual Activity   Alcohol use: No    Comment: none since 9/11   Drug use: No   Sexual activity: Not Currently  Lifestyle   Physical activity:    Days per week: Not on file    Minutes per session: Not on file   Stress: Not on file  Relationships   Social connections:    Talks on phone: Not on file    Gets together: Not on file    Attends religious service: Not on file    Active member of club or organization: Not on file    Attends meetings of clubs or organizations: Not on file    Relationship status: Not on file   Intimate partner violence:    Fear of current or ex partner: Not on file    Emotionally abused: Not on file    Physically abused: Not on file    Forced sexual activity: Not on file  Other Topics Concern   Not on file  Social History Narrative   Not on file     Family History  Problem Relation Age of Onset   Heart attack Father      Current Outpatient Medications on File Prior to Visit  Medication Sig Dispense Refill   aspirin 81 MG chewable tablet Chew 1 tablet (81 mg total) by mouth daily. 90 tablet 3   bisoprolol (ZEBETA) 5 MG tablet Take 1 tablet (5 mg total) by mouth daily. 30 tablet 1     ENTRESTO 97-103 MG TAKE 1 TABLET BY MOUTH TWICE DAILY 90 tablet 1   esomeprazole (NEXIUM) 40 MG packet Take 40 mg by mouth daily before breakfast. 30 each 2   furosemide (LASIX) 40 MG tablet Take 1 tablet (40 mg total) by mouth as needed for edema. (Patient taking differently: Take 40 mg by mouth as needed. ) 30 tablet 0   HYDROcodone-acetaminophen (NORCO) 10-325 MG tablet Take 1 tablet by mouth every 4 (four) hours as needed for moderate pain.      Magnesium Citrate 200 MG TABS Take 1 tablet by mouth 2 (two) times daily.     nicotine polacrilex (NICORETTE) 2 MG gum Take 2 mg by mouth as needed for  smoking cessation.     potassium chloride SA (K-DUR,KLOR-CON) 20 MEQ tablet Take 1 tablet (20 mEq total) by mouth daily. 30 tablet 0   rosuvastatin (CRESTOR) 20 MG tablet Take 1 tablet (20 mg total) by mouth daily at 6 PM. 30 tablet 3   sacubitril-valsartan (ENTRESTO) 97-103 MG Take 1 tablet by mouth 2 (two) times daily. 30 tablet 1   eszopiclone (LUNESTA) 1 MG TABS tablet Take 1 tablet by mouth as needed.     Na Sulfate-K Sulfate-Mg Sulf 17.5-3.13-1.6 GM/177ML SOLN Take 1 kit by mouth as directed. (Patient not taking: Reported on 04/22/2018) 354 mL 0   spironolactone (ALDACTONE) 25 MG tablet Take 25 mg by mouth daily.     zolpidem (AMBIEN) 5 MG tablet Take 5 mg by mouth at bedtime.     No current facility-administered medications on file prior to visit.     Cardiovascular studies:  Scheduled Remote ICD transmission 01/27/2018: No significnat arrhythmias. NO VHR episodes. No mode switches.  Trans-thoracic impedance trends and the Optivol Fluid Index do no present significant abnormalities. Battery longevity is 6.9-8.7 yrs. RA pacing is 10.4 %, RV pacing is 99.9 %, and LV pacing is 99.9 %.  EKG 09/25/2017: Probable sinus bradycardia 49 bpm. BiV paced rhythm   Echocardiogram 09/22/2017: Left ventricle cavity is normal in size. Mild concentric hypertrophy of the left ventricle.  Normal global wall motion. Normal diastolic filling pattern. Calculated EF 55%. Left atrial cavity is moderately dilated at 4.8 cm. Right ventricle cavity is normal in size. Normal right ventricular function. Pacemaker lead/ICD lead noted in the RV. Trace aortic regurgitation. Borderline prolapse of the posterior > anterior MV leaflet with resultant Moderate anteriorly directed mitral regurgitation. Mild tricuspid regurgitation. No evidence of pulmonary hypertension. Compared to the study done on 02/06/2017, EF is improved from 20%, moderately severe MR is now moderate at most.  10/28/2016 NLeft/right heart cath, coronary angiography and angioplasty  Nonischemic cardiomyopathy with single vessel obstructive disease Proximal OM1 3 5 x 16 mm Promus Premier drug-eluting stent  Recent labs: Results for PIERO, MUSTARD (MRN [id number]) as of 04/22/2018 13:33  Ref. Range 04/19/2018 69:62  BASIC METABOLIC PANEL Unknown Rpt (A)  Sodium Latest Ref Range: 134 - 144 mmol/L 140  Potassium Latest Ref Range: 3.5 - 5.2 mmol/L 4.3  Chloride Latest Ref Range: 96 - 106 mmol/L 103  CO2 Latest Ref Range: 20 - 29 mmol/L 23  Glucose Latest Ref Range: 65 - 99 mg/dL 115 (H)  BUN Latest Ref Range: 6 - 24 mg/dL 9  Creatinine Latest Ref Range: 0.76 - 1.27 mg/dL 0.95  Calcium Latest Ref Range: 8.7 - 10.2 mg/dL 9.9  BUN/Creatinine Ratio Latest Ref Range: 9 - 20  9  GFR, Est Non African American Latest Ref Range: >59 mL/min/1.73 90  GFR, Est African American Latest Ref Range: >59 mL/min/1.73 104    Review of Systems  Constitution: Negative for decreased appetite, malaise/fatigue, weight gain and weight loss.  HENT: Negative for congestion.   Eyes: Negative for visual disturbance.  Cardiovascular: Negative for chest pain, dyspnea on exertion, leg swelling, palpitations and syncope.  Respiratory: Negative for shortness of breath.   Endocrine: Negative for cold intolerance.  Hematologic/Lymphatic: Does not  bruise/bleed easily.  Skin: Negative for itching and rash.  Musculoskeletal: Negative for myalgias.  Gastrointestinal: Positive for bloating and abdominal pain (With heavy lifting). Negative for nausea and vomiting.  Genitourinary: Negative for dysuria.  Neurological: Negative for dizziness and weakness.  Psychiatric/Behavioral: The patient is not  nervous/anxious.   All other systems reviewed and are negative.      There were no vitals filed for this visit.   Observation/findings during video visit   Objective:     Physical Exam  Constitutional: He is oriented to person, place, and time. He appears well-developed and well-nourished. No distress.  Pulmonary/Chest: Effort normal.  Neurological: He is alert and oriented to person, place, and time.  Psychiatric: He has a normal mood and affect.  Nursing note and vitals reviewed.        Assessment & Recommendations:   53 y/o Caucasian male with nonischemic dilated cardiomyopathy with now mormalization of LVEF post CRT-D placement 03/2017, single vessel CAD s/p successful PCI to OM1 with 3.5 X 16 mm DES 10/2016, hyperlipidemia, prior smoking history, testosterone deficiency.  Nonischemic cardiomyopathy: Clinically compensated. No CHF on ICD monitoring.  Continue bisoprolol to 5 mg daily, Entresto 97-103 mg bid, Spironolactone 25 mg daily. Resting HR <70 bpm. Corlanor not indicated.   CAD: Stable with no angina symnptoms. Conitnue aspirin and statin.  Abdominal pain: Unlikely to be cardiac in etiology. Recommend follow up with PCP. On a separate note, his BG has been >100. He claims that these are not fasting. Nonetheless, I have encouraged him to avoid drinking sugary drinks.  I will see him back in 6 months.   [name], MD [facility] Pager: [phone] Office: [phone] If no answer Cell [phone]

## 2018-05-05 DIAGNOSIS — Z95810 Presence of automatic (implantable) cardiac defibrillator: Secondary | ICD-10-CM | POA: Diagnosis not present

## 2018-05-05 DIAGNOSIS — Z4502 Encounter for adjustment and management of automatic implantable cardiac defibrillator: Secondary | ICD-10-CM

## 2018-05-05 DIAGNOSIS — I447 Left bundle-branch block, unspecified: Secondary | ICD-10-CM

## 2018-05-19 ENCOUNTER — Ambulatory Visit: Payer: Self-pay

## 2018-06-05 ENCOUNTER — Other Ambulatory Visit: Payer: Self-pay | Admitting: Cardiology

## 2018-06-05 DIAGNOSIS — Z45018 Encounter for adjustment and management of other part of cardiac pacemaker: Secondary | ICD-10-CM

## 2018-06-05 DIAGNOSIS — I447 Left bundle-branch block, unspecified: Secondary | ICD-10-CM

## 2018-06-05 DIAGNOSIS — Z950 Presence of cardiac pacemaker: Secondary | ICD-10-CM

## 2018-06-07 NOTE — Telephone Encounter (Signed)
Please fill

## 2018-06-17 ENCOUNTER — Encounter: Payer: Self-pay | Admitting: Cardiology

## 2018-06-17 DIAGNOSIS — Z4502 Encounter for adjustment and management of automatic implantable cardiac defibrillator: Secondary | ICD-10-CM

## 2018-06-17 HISTORY — DX: Encounter for adjustment and management of automatic implantable cardiac defibrillator: Z45.02

## 2018-07-05 ENCOUNTER — Other Ambulatory Visit: Payer: Self-pay | Admitting: Cardiology

## 2018-07-06 DIAGNOSIS — I447 Left bundle-branch block, unspecified: Secondary | ICD-10-CM | POA: Diagnosis not present

## 2018-07-06 DIAGNOSIS — Z4502 Encounter for adjustment and management of automatic implantable cardiac defibrillator: Secondary | ICD-10-CM

## 2018-07-06 DIAGNOSIS — Z95810 Presence of automatic (implantable) cardiac defibrillator: Secondary | ICD-10-CM | POA: Diagnosis not present

## 2018-07-13 ENCOUNTER — Other Ambulatory Visit: Payer: Self-pay | Admitting: Cardiology

## 2018-07-13 NOTE — Telephone Encounter (Signed)
Please fill if necessary

## 2018-07-14 DIAGNOSIS — Z4502 Encounter for adjustment and management of automatic implantable cardiac defibrillator: Secondary | ICD-10-CM

## 2018-07-14 DIAGNOSIS — Z95810 Presence of automatic (implantable) cardiac defibrillator: Secondary | ICD-10-CM

## 2018-07-14 DIAGNOSIS — I447 Left bundle-branch block, unspecified: Secondary | ICD-10-CM

## 2018-07-18 NOTE — Telephone Encounter (Signed)
Patient aware.

## 2018-07-19 ENCOUNTER — Encounter: Payer: Self-pay | Admitting: Cardiology

## 2018-07-28 ENCOUNTER — Telehealth: Payer: Self-pay

## 2018-07-28 NOTE — Telephone Encounter (Signed)
LMOM regarding transmission on 07/14/2018

## 2018-08-04 ENCOUNTER — Telehealth: Payer: Self-pay

## 2018-08-04 ENCOUNTER — Other Ambulatory Visit: Payer: Self-pay | Admitting: Cardiology

## 2018-08-04 ENCOUNTER — Other Ambulatory Visit: Payer: Self-pay

## 2018-08-04 DIAGNOSIS — Z20822 Contact with and (suspected) exposure to covid-19: Secondary | ICD-10-CM

## 2018-08-04 NOTE — Telephone Encounter (Signed)
-----   Message from [name], MD sent at 07/19/2018  6:50 AM EDT ----- Regarding: ICD Normal function. No heart failure signs. He also needs clinic device check. You can arrange this with me when patient can come in, otherwise with MP on his 6 month visit. Miguel Pena

## 2018-08-04 NOTE — Telephone Encounter (Signed)
Pt has one in 10/2018

## 2018-08-05 LAB — NOVEL CORONAVIRUS, NAA: SARS-CoV-2, NAA: NOT DETECTED

## 2018-08-11 ENCOUNTER — Other Ambulatory Visit: Payer: Self-pay | Admitting: Cardiology

## 2018-08-14 DIAGNOSIS — I447 Left bundle-branch block, unspecified: Secondary | ICD-10-CM

## 2018-08-14 DIAGNOSIS — Z95810 Presence of automatic (implantable) cardiac defibrillator: Secondary | ICD-10-CM

## 2018-08-14 DIAGNOSIS — Z4502 Encounter for adjustment and management of automatic implantable cardiac defibrillator: Secondary | ICD-10-CM

## 2018-08-15 ENCOUNTER — Encounter: Payer: Self-pay | Admitting: Cardiology

## 2018-09-14 DIAGNOSIS — Z95810 Presence of automatic (implantable) cardiac defibrillator: Secondary | ICD-10-CM

## 2018-09-14 DIAGNOSIS — I5042 Chronic combined systolic (congestive) and diastolic (congestive) heart failure: Secondary | ICD-10-CM

## 2018-09-14 DIAGNOSIS — Z4502 Encounter for adjustment and management of automatic implantable cardiac defibrillator: Secondary | ICD-10-CM

## 2018-09-22 ENCOUNTER — Encounter: Payer: Self-pay | Admitting: Family Medicine

## 2018-09-22 ENCOUNTER — Ambulatory Visit: Payer: 59 | Admitting: Family Medicine

## 2018-09-22 ENCOUNTER — Other Ambulatory Visit: Payer: Self-pay

## 2018-09-22 VITALS — BP 123/73 | HR 59 | Temp 98.9°F | Resp 18 | Wt 184.0 lb

## 2018-09-22 DIAGNOSIS — Z8719 Personal history of other diseases of the digestive system: Secondary | ICD-10-CM | POA: Diagnosis not present

## 2018-09-22 DIAGNOSIS — K220 Achalasia of cardia: Secondary | ICD-10-CM | POA: Diagnosis not present

## 2018-09-22 DIAGNOSIS — R1030 Lower abdominal pain, unspecified: Secondary | ICD-10-CM | POA: Diagnosis not present

## 2018-09-22 DIAGNOSIS — K222 Esophageal obstruction: Secondary | ICD-10-CM

## 2018-09-22 MED ORDER — OMEPRAZOLE 20 MG PO CPDR: 20.0000 mg | DELAYED_RELEASE_CAPSULE | Freq: Every day | ORAL | 1 refills | Status: DC

## 2018-09-22 NOTE — Patient Instructions (Signed)
Continue to try to minimize nicotine gum as that appears to affect your abdominal symptoms.  Start omeprazole once per day to help protect stomach lining but I will also refer you to gastroenterologist.  CT scan of your abdomen was ordered to rule out infection but that is less likely at this time.  If any worsening of pain, be seen as you may need to be on antibiotics or other testing.  Recheck with me in 1 week, sooner or to the emergency room if acute worsening.    Abdominal Pain, Adult Abdominal pain can be caused by many things. Often, abdominal pain is not serious and it gets better with no treatment or by being treated at home. However, sometimes abdominal pain is serious. Your health care provider will do a medical history and a physical exam to try to determine the cause of your abdominal pain. Follow these instructions at home:  Take over-the-counter and prescription medicines only as told by your health care provider. Do not take a laxative unless told by your health care provider.  Drink enough fluid to keep your urine clear or pale yellow.  Watch your condition for any changes.  Keep all follow-up visits as told by your health care provider. This is important. Contact a health care provider if:  Your abdominal pain changes or gets worse.  You are not hungry or you lose weight without trying.  You are constipated or have diarrhea for more than 2-3 days.  You have pain when you urinate or have a bowel movement.  Your abdominal pain wakes you up at night.  Your pain gets worse with meals, after eating, or with certain foods.  You are throwing up and cannot keep anything down.  You have a fever. Get help right away if:  Your pain does not go away as soon as your health care provider told you to expect.  You cannot stop throwing up.  Your pain is only in areas of the abdomen, such as the right side or the left lower portion of the abdomen.  You have bloody or black  stools, or stools that look like tar.  You have severe pain, cramping, or bloating in your abdomen.  You have signs of dehydration, such as: ? Dark urine, very little urine, or no urine. ? Cracked lips. ? Dry mouth. ? Sunken eyes. ? Sleepiness. ? Weakness. This information is not intended to replace advice given to you by your health care provider. Make sure you discuss any questions you have with your health care provider. Document Released: 10/02/2004 Document Revised: 07/13/2015 Document Reviewed: 06/06/2015 Elsevier Interactive Patient Education  [employer].

## 2018-09-22 NOTE — Progress Notes (Signed)
Subjective:    Patient ID: Miguel Pena, male    DOB: 26-Sep-1965, 53 y.o.   MRN: [id number]  HPI Miguel Pena is a 53 y.o. male Presents today for: Chief Complaint  Patient presents with  . Abdominal Pain    burning in stomach, started months ago and getting worst for last 6-8 weeks    Abdominal Pain: Past 6 months, more in past 6 weeks. Pain comes and goes, but daily. Lasts for 15-82mn. Dark stools past few weeks, but has been using Pepto.  Slight better today (less nicotine gum). No pain at present time.   Usually 10-15 pieces nicotine gum per day - more sore when chewing gum? Some Mtn. Dew.  No alcohol.  No nsaids.  Some difficulty with passing food - has to drink with water.,and small bites. No regurgitation.  No prior hx PUD known. otc acid reducer - unknown name - QD.   No fever/n/v External hemorrhoids that bleed with wiping.  Alternating stools, diarrhea. Constipation at times. Some fatigue, no syncope/near syncope.  No dysuria/frequency/urinary symptoms.   GI: Nandigam  In hospital last year. No outpatient follow up. - endoscopy/colonoscopy in 03/2017:   Grade C reflux and candidiasis esophagitis. Biopsied. - Benign-appearing esophageal stenosis. - Gastritis with hemorrhage. Biopsied. - Duodenal erosions without bleeding. - The examination was otherwise normal  Moderate diverticulosis in the sigmoid colon and in the descending colon. There was narrowing of the colon in association with the diverticular opening. Peri-diverticular erythema was seen. There was no evidence of diverticular bleeding. - One 5 mm polyp in the sigmoid colon, removed with a cold snare. Resected and retrieved. Clips (MR conditional) were placed. - Non-bleeding internal hemorrhoids.   Tx: pepto.bismol.   Patient Active Problem List   Diagnosis Date Noted  . Encounter for adjustment or management of automatic implantable cardioverter-defibrillator 06/17/2018  . Coronary artery  disease involving native coronary artery of native heart without angina pectoris 04/22/2018  . Chronic combined systolic and diastolic heart failure ([facility] 03/16/2017  . LBBB (left bundle branch block) 03/16/2017  . NICM (nonischemic cardiomyopathy) ([facility] 03/16/2017  . ICD: Medtronic MRI Quad CRTD (Bi-V ICD) implantation 03/16/2017 -[name]03/11/2017  . Polyp of sigmoid colon   . BRBPR (bright red blood per rectum)   . Dysphagia   . Gastroesophageal reflux disease   . Post PTCA 10/28/2016  . Heart failure with reduced ejection fraction, NYHA class III ([facility] 10/24/2016  . S/P right inguinal hernia repair 02/16/2013   Past Medical History:  Diagnosis Date  . Bursitis of right shoulder   . CHF (congestive heart failure) ([facility]    /[name]10/19/2018  . Chronic combined systolic and diastolic heart failure ([facility] 03/16/2017  . Dilated cardiomyopathy ([facility]    /[name]10/19/2018  . Dysrhythmia 1995   "VT" per patient- states had neg stress test and no problems since  . Encounter for assessment of automatic implantable cardioverter-defibrillator (AICD) 06/17/2018  . Heart murmur    "when I was young"  . History of kidney stones   . Hypercholesterolemia   . ICD: Medtronic MRI Quad CRTD (Bi-V ICD) implantation 03/16/2017 -[name]3/11/2017   Scheduled Remote ICD check   8.7.20: No VHR episodes. No mode switches. Normal health trends. Trans-thoracic impedance trends and the OptiVol Fluid Index do no present significant abnormalities. Battery longevity is 6.5-8.3 years. RA pacing is 8.3 %, RV pacing is 99.9 %, and LV pacing is 99.9 %.  Clinic: 05/18/17  . NICM (nonischemic  cardiomyopathy) ([place]) 03/16/2017   Past Surgical History:  Procedure Laterality Date  . BIV ICD INSERTION CRT-D N/A 03/16/2017   Procedure: BIV ICD INSERTION CRT-D;  Surgeon: Deboraha Sprang, MD;  Location: [facility];  Service: Cardiovascular;  Laterality: N/A;  . COLONOSCOPY WITH PROPOFOL N/A 03/10/2017   Procedure:  COLONOSCOPY WITH PROPOFOL;  Surgeon: Mauri Pole, MD;  Location: [facility];  Service: Endoscopy;  Laterality: N/A;  . CORONARY ANGIOPLASTY WITH STENT PLACEMENT  10/28/2016  . CORONARY STENT INTERVENTION N/A 10/28/2016   Procedure: CORONARY STENT INTERVENTION;  Surgeon: [name], MD;  Location: [facility];  Service: Cardiovascular;  Laterality: N/A;  . ESOPHAGOGASTRODUODENOSCOPY (EGD) WITH PROPOFOL N/A 03/10/2017   Procedure: ESOPHAGOGASTRODUODENOSCOPY (EGD) WITH PROPOFOL;  Surgeon: Mauri Pole, MD;  Location: [facility];  Service: Endoscopy;  Laterality: N/A;  . INGUINAL HERNIA REPAIR Right 02/16/2013   Procedure: RIGHT INGUINAL HERNIA REPAIR ;  Surgeon: [name], MD;  Location: [facility];  Service: General;  Laterality: Right;  With MESH  . INGUINAL HERNIA REPAIR Right 02/16/2013  . INTRAVASCULAR PRESSURE WIRE/FFR STUDY N/A 10/28/2016   Procedure: INTRAVASCULAR PRESSURE WIRE/FFR STUDY;  Surgeon: [name], MD;  Location: [facility];  Service: Cardiovascular;  Laterality: N/A;  . RIGHT/LEFT HEART CATH AND CORONARY ANGIOGRAPHY N/A 10/28/2016   Procedure: RIGHT/LEFT HEART CATH AND CORONARY ANGIOGRAPHY;  Surgeon: [name], MD;  Location: [facility];  Service: Cardiovascular;  Laterality: N/A;  . ULTRASOUND GUIDANCE FOR VASCULAR ACCESS  10/28/2016   Procedure: Ultrasound Guidance For Vascular Access;  Surgeon: [name], MD;  Location: [facility];  Service: Cardiovascular;;   No Known Allergies Prior to Admission medications   Medication Sig Start Date End Date Taking? Authorizing Provider  aspirin 81 MG chewable tablet Chew 1 tablet (81 mg total) by mouth daily. 10/29/16  Yes Patwardhan, Manish J, MD  bisoprolol (ZEBETA) 5 MG tablet TAKE 1 TABLET BY MOUTH EVERY DAY 08/11/18  Yes Patwardhan, Manish J, MD  ENTRESTO 97-103 MG TAKE 1 TABLET BY MOUTH TWICE DAILY 07/05/18  Yes Patwardhan, Manish J, MD   eszopiclone (LUNESTA) 1 MG TABS tablet Take 1 tablet by mouth as needed. 04/07/18  Yes [provider]  furosemide (LASIX) 40 MG tablet Take 1 tablet (40 mg total) by mouth as needed for edema. Patient taking differently: Take 40 mg by mouth as needed.  10/28/16  Yes Patwardhan, Reynold Bowen, MD  HYDROcodone-acetaminophen (NORCO) 10-325 MG tablet Take 1 tablet by mouth every 4 (four) hours as needed for moderate pain.  09/11/16  Yes [provider]  nicotine polacrilex (NICORETTE) 2 MG gum Take 2 mg by mouth as needed for smoking cessation.   Yes [provider]  potassium chloride SA (K-DUR) 20 MEQ tablet TAKE 1 TABLET BY MOUTH DAILY 08/04/18  Yes Patwardhan, Manish J, MD  rosuvastatin (CRESTOR) 20 MG tablet TAKE 1 TABLET BY MOUTH DAILY 07/13/18  Yes Patwardhan, Manish J, MD  spironolactone (ALDACTONE) 25 MG tablet TAKE 1 TABLET BY MOUTH EVERY DAY 06/07/18  Yes Patwardhan, Reynold Bowen, MD   Social History   Socioeconomic History  . Marital status: Married    Spouse name: Not on file  . Number of children: 2  . Years of education: Not on file  . Highest education level: Not on file  Occupational History  . Not on file  Social Needs  . Financial resource strain: Not on file  .  Food insecurity    Worry: Not on file    Inability: Not on file  . Transportation needs    Medical: Not on file    Non-medical: Not on file  Tobacco Use  . Smoking status: Former Smoker    Packs/day: 1.00    Years: 12.00    Pack years: 12.00    Types: Cigarettes, Cigars    Quit date: 02/16/2011    Years since quitting: 7.6  . Smokeless tobacco: Former Systems developer    Types: Snuff  . Tobacco comment: 10/28/2016 'chewed for a couple years after I quit smoking"  Substance and Sexual Activity  . Alcohol use: No    Comment: none since 9/11  . Drug use: No  . Sexual activity: Not Currently  Lifestyle  . Physical activity    Days per week: Not on file    Minutes per session: Not on file  . Stress: Not  on file  Relationships  . Social Herbalist on phone: Not on file    Gets together: Not on file    Attends religious service: Not on file    Active member of club or organization: Not on file    Attends meetings of clubs or organizations: Not on file    Relationship status: Not on file  . Intimate partner violence    Fear of current or ex partner: Not on file    Emotionally abused: Not on file    Physically abused: Not on file    Forced sexual activity: Not on file  Other Topics Concern  . Not on file  Social History Narrative  . Not on file    Review of Systems     Objective:   Physical Exam Vitals signs reviewed.  Constitutional:      General: He is not in acute distress.    Appearance: He is well-developed. He is not ill-appearing, toxic-appearing or diaphoretic.  HENT:     Head: Normocephalic and atraumatic.  Eyes:     Pupils: Pupils are equal, round, and reactive to light.  Neck:     Vascular: No carotid bruit or JVD.  Cardiovascular:     Rate and Rhythm: Normal rate and regular rhythm.     Heart sounds: Normal heart sounds. No murmur.  Pulmonary:     Effort: Pulmonary effort is normal.     Breath sounds: Normal breath sounds. No rales.  Abdominal:     General: Bowel sounds are increased.     Tenderness: There is abdominal tenderness (min suprapubic, to LLQ greater than epigastric. ). There is no right CVA tenderness, left CVA tenderness, guarding or rebound.  Skin:    General: Skin is warm and dry.  Neurological:     Mental Status: He is alert and oriented to person, place, and time.    Vitals:   09/22/18 1605  BP: 123/73  Pulse: (!) 59  Resp: 18  Temp: 98.9 F (37.2 C)  TempSrc: Oral  SpO2: 98%  Weight: 184 lb (83.5 kg)       Assessment & Plan:    Miguel Pena is a 53 y.o. male History of diverticulosis - Plan: CT Abdomen Pelvis W Contrast, Ambulatory referral to Gastroenterology, CBC with Differential/Platelet  Lower abdominal  pain - Plan: omeprazole (PRILOSEC) 20 MG capsule, Ambulatory referral to Gastroenterology, Comprehensive metabolic panel  History of gastritis - Plan: omeprazole (PRILOSEC) 20 MG capsule, Ambulatory referral to Gastroenterology, CBC with Differential/Platelet, Comprehensive metabolic panel  Achalasia -  Plan: Ambulatory referral to Gastroenterology  Esophageal stenosis  Ongoing abdominal pain for months, possibly worse past 4 to 6 weeks, but no acute changes past few days.  Previous gastritis, esophageal stenosis and duodenal erosions noted from endoscopy last year.  Pain primarily lower abdomen, no urinary symptoms.  Did have diverticulosis on prior colonoscopy but no history of diverticulitis.  -Check CT abdomen pelvis next few days, CBC obtained.  As improved today, hold on antibiotics at this time.  RTC/ER precautions given  -Start omeprazole given previous gastritis and refer to gastroenterology, can discuss achalasia symptoms possible esophageal stenosis issues at that time.  -Decrease nicotine as that appears to worsen symptoms  -Recheck 1 week, ER precautions.  Meds ordered this encounter  Medications  . omeprazole (PRILOSEC) 20 MG capsule    Sig: Take 1 capsule (20 mg total) by mouth daily.    Dispense:  30 capsule    Refill:  1   Patient Instructions  Continue to try to minimize nicotine gum as that appears to affect your abdominal symptoms.  Start omeprazole once per day to help protect stomach lining but I will also refer you to gastroenterologist.  CT scan of your abdomen was ordered to rule out infection but that is less likely at this time.  If any worsening of pain, be seen as you may need to be on antibiotics or other testing.  Recheck with me in 1 week, sooner or to the emergency room if acute worsening.    Abdominal Pain, Adult Abdominal pain can be caused by many things. Often, abdominal pain is not serious and it gets better with no treatment or by being treated at  home. However, sometimes abdominal pain is serious. Your health care provider will do a medical history and a physical exam to try to determine the cause of your abdominal pain. Follow these instructions at home:  Take over-the-counter and prescription medicines only as told by your health care provider. Do not take a laxative unless told by your health care provider.  Drink enough fluid to keep your urine clear or pale yellow.  Watch your condition for any changes.  Keep all follow-up visits as told by your health care provider. This is important. Contact a health care provider if:  Your abdominal pain changes or gets worse.  You are not hungry or you lose weight without trying.  You are constipated or have diarrhea for more than 2-3 days.  You have pain when you urinate or have a bowel movement.  Your abdominal pain wakes you up at night.  Your pain gets worse with meals, after eating, or with certain foods.  You are throwing up and cannot keep anything down.  You have a fever. Get help right away if:  Your pain does not go away as soon as your health care provider told you to expect.  You cannot stop throwing up.  Your pain is only in areas of the abdomen, such as the right side or the left lower portion of the abdomen.  You have bloody or black stools, or stools that look like tar.  You have severe pain, cramping, or bloating in your abdomen.  You have signs of dehydration, such as: ? Dark urine, very little urine, or no urine. ? Cracked lips. ? Dry mouth. ? Sunken eyes. ? Sleepiness. ? Weakness. This information is not intended to replace advice given to you by your health care provider. Make sure you discuss any questions you have with  your health care provider. Document Released: 10/02/2004 Document Revised: 07/13/2015 Document Reviewed: 06/06/2015 Elsevier Interactive Patient Education  2020 Benson,   Merri Ray, MD [facility].  09/22/18 7:03 PM

## 2018-09-23 LAB — CBC WITH DIFFERENTIAL/PLATELET
Basophils Absolute: 0 10*3/uL (ref 0.0–0.2)
Basos: 1 %
EOS (ABSOLUTE): 0 10*3/uL (ref 0.0–0.4)
Eos: 1 %
Hematocrit: 35.5 % — ABNORMAL LOW (ref 37.5–51.0)
Hemoglobin: 11.9 g/dL — ABNORMAL LOW (ref 13.0–17.7)
Immature Grans (Abs): 0 10*3/uL (ref 0.0–0.1)
Immature Granulocytes: 1 %
Lymphocytes Absolute: 1.6 10*3/uL (ref 0.7–3.1)
Lymphs: 21 %
MCH: 29.3 pg (ref 26.6–33.0)
MCHC: 33.5 g/dL (ref 31.5–35.7)
MCV: 87 fL (ref 79–97)
Monocytes Absolute: 0.4 10*3/uL (ref 0.1–0.9)
Monocytes: 5 %
Neutrophils Absolute: 5.3 10*3/uL (ref 1.4–7.0)
Neutrophils: 71 %
Platelets: 291 10*3/uL (ref 150–450)
RBC: 4.06 x10E6/uL — ABNORMAL LOW (ref 4.14–5.80)
RDW: 13.5 % (ref 11.6–15.4)
WBC: 7.4 10*3/uL (ref 3.4–10.8)

## 2018-09-23 LAB — COMPREHENSIVE METABOLIC PANEL
ALT: 19 IU/L (ref 0–44)
AST: 20 IU/L (ref 0–40)
Albumin/Globulin Ratio: 2 (ref 1.2–2.2)
Albumin: 4.3 g/dL (ref 3.8–4.9)
Alkaline Phosphatase: 77 IU/L (ref 39–117)
BUN/Creatinine Ratio: 14 (ref 9–20)
BUN: 11 mg/dL (ref 6–24)
Bilirubin Total: 0.7 mg/dL (ref 0.0–1.2)
CO2: 20 mmol/L (ref 20–29)
Calcium: 9.6 mg/dL (ref 8.7–10.2)
Chloride: 109 mmol/L — ABNORMAL HIGH (ref 96–106)
Creatinine, Ser: 0.78 mg/dL (ref 0.76–1.27)
GFR calc Af Amer: 118 mL/min/{1.73_m2} (ref >59)
GFR calc non Af Amer: 102 mL/min/{1.73_m2} (ref >59)
Globulin, Total: 2.2 g/dL (ref 1.5–4.5)
Glucose: 103 mg/dL — ABNORMAL HIGH (ref 65–99)
Potassium: 4.4 mmol/L (ref 3.5–5.2)
Sodium: 143 mmol/L (ref 134–144)
Total Protein: 6.5 g/dL (ref 6.0–8.5)

## 2018-09-29 ENCOUNTER — Ambulatory Visit
Admission: RE | Admit: 2018-09-29 | Discharge: 2018-09-29 | Disposition: A | Discharge status: Home / Self Care | Payer: 59 | Source: Ambulatory Visit | Attending: Family Medicine | Admitting: Family Medicine

## 2018-09-29 ENCOUNTER — Other Ambulatory Visit: Payer: Self-pay

## 2018-09-29 DIAGNOSIS — Z8719 Personal history of other diseases of the digestive system: Secondary | ICD-10-CM

## 2018-09-29 MED ORDER — IOPAMIDOL (ISOVUE-300) INJECTION 61%
100.0000 mL | Freq: Once | INTRAVENOUS | Status: AC | PRN
  Administered 2018-09-29: 15:00:00 100 mL via INTRAVENOUS

## 2018-09-30 ENCOUNTER — Ambulatory Visit: Payer: 59 | Admitting: Family Medicine

## 2018-09-30 VITALS — BP 114/65 | HR 66 | Temp 98.7°F | Wt 184.8 lb

## 2018-09-30 DIAGNOSIS — K222 Esophageal obstruction: Secondary | ICD-10-CM

## 2018-09-30 DIAGNOSIS — K648 Other hemorrhoids: Secondary | ICD-10-CM

## 2018-09-30 DIAGNOSIS — K625 Hemorrhage of anus and rectum: Secondary | ICD-10-CM

## 2018-09-30 DIAGNOSIS — Z8719 Personal history of other diseases of the digestive system: Secondary | ICD-10-CM | POA: Diagnosis not present

## 2018-09-30 DIAGNOSIS — R1030 Lower abdominal pain, unspecified: Secondary | ICD-10-CM | POA: Diagnosis not present

## 2018-09-30 DIAGNOSIS — K529 Noninfective gastroenteritis and colitis, unspecified: Secondary | ICD-10-CM

## 2018-09-30 LAB — POCT URINALYSIS DIP (MANUAL ENTRY)
Bilirubin, UA: NEGATIVE
Glucose, UA: NEGATIVE mg/dL
Ketones, POC UA: NEGATIVE mg/dL
Leukocytes, UA: NEGATIVE
Nitrite, UA: NEGATIVE
Protein Ur, POC: NEGATIVE mg/dL
Spec Grav, UA: 1.015 (ref 1.010–1.025)
Urobilinogen, UA: 0.2 E.U./dL
pH, UA: 6.5 (ref 5.0–8.0)

## 2018-09-30 NOTE — Progress Notes (Signed)
Subjective:    Patient ID: Miguel Pena, male    DOB: [birth year], 53 y.o.   MRN: [id number]  HPI Miguel Pena is a 53 y.o. male Presents today for: Chief Complaint  Patient presents with  . Abdominal Pain    Here today for 1 wk f/u abd pain on 09/22/18 and to go over CT scan. Also need todays visit sent to my pcp [facility]. R Elkins   Follow-up of abdominal pain, seen September 16.  History of diverticulosis without known history of diverticulitis.  Previous history of gastritis, esophageal stenosis and duodenal erosions on endoscopy last year, but pain was primarily lower abdominal, no urinary symptoms.  Omeprazole was started given prior gastritis and refer to gastroenterology.  Plan for achalasia/esophageal stenosis symptoms to be discussed at that time.  Abdominal CT was performed yesterday.  Inflammatory changes and thickening of the distal and terminal ileum, possible infectious enteritis or inflammatory bowel.  No bowel obstruction, normal appendix, sigmoid diverticulosis and cholelithiasis noted.  Noticed increased symptoms when increased nicotine gum(20-25 in a day) along with [facility].   Had felt better with less use (5-10 per day). No further dark stools with stopping pepto bismol. Overall major difference - feels much better.  Taking omeprazole daily. Pain is better overall.  No fever.  No n/v. 1 loose stool yesterday, normal otherwise.  No history of IBD.  appt on 9/30 with [facility]. Blood with wiping on tissue at times for awhile - possible past year. Notes with straining - possible hemorrhoids, feels it push back in.  Internal hemorhoids noted on prior colonoscopy.  Painless bleeding.  Results for orders placed or performed in visit on 09/22/18  CBC with Differential/Platelet  Result Value Ref Range   WBC 7.4 3.4 - 10.8 x10E3/uL   RBC 4.06 (L) 4.14 - 5.80 x10E6/uL   Hemoglobin 11.9 (L) 13.0 - 17.7 g/dL   Hematocrit 35.5 (L) 37.5 - 51.0  %   MCV 87 79 - 97 fL   MCH 29.3 26.6 - 33.0 pg   MCHC 33.5 31.5 - 35.7 g/dL   RDW 13.5 11.6 - 15.4 %   Platelets 291 150 - 450 x10E3/uL   Neutrophils 71 Not Estab. %   Lymphs 21 Not Estab. %   Monocytes 5 Not Estab. %   Eos 1 Not Estab. %   Basos 1 Not Estab. %   Neutrophils Absolute 5.3 1.4 - 7.0 x10E3/uL   Lymphocytes Absolute 1.6 0.7 - 3.1 x10E3/uL   Monocytes Absolute 0.4 0.1 - 0.9 x10E3/uL   EOS (ABSOLUTE) 0.0 0.0 - 0.4 x10E3/uL   Basophils Absolute 0.0 0.0 - 0.2 x10E3/uL   Immature Granulocytes 1 Not Estab. %   Immature Grans (Abs) 0.0 0.0 - 0.1 x10E3/uL  Comprehensive metabolic panel  Result Value Ref Range   Glucose 103 (H) 65 - 99 mg/dL   BUN 11 6 - 24 mg/dL   Creatinine, Ser 0.78 0.76 - 1.27 mg/dL   GFR calc non Af Amer 102 >59 mL/min/1.73   GFR calc Af Amer 118 >59 mL/min/1.73   BUN/Creatinine Ratio 14 9 - 20   Sodium 143 134 - 144 mmol/L   Potassium 4.4 3.5 - 5.2 mmol/L   Chloride 109 (H) 96 - 106 mmol/L   CO2 20 20 - 29 mmol/L   Calcium 9.6 8.7 - 10.2 mg/dL   Total Protein 6.5 6.0 - 8.5 g/dL   Albumin 4.3 3.8 - 4.9 g/dL   Globulin,  Total 2.2 1.5 - 4.5 g/dL   Albumin/Globulin Ratio 2.0 1.2 - 2.2   Bilirubin Total 0.7 0.0 - 1.2 mg/dL   Alkaline Phosphatase 77 39 - 117 IU/L   AST 20 0 - 40 IU/L   ALT 19 0 - 44 IU/L   Patient Active Problem List   Diagnosis Date Noted  . Encounter for adjustment or management of automatic implantable cardioverter-defibrillator 06/17/2018  . Coronary artery disease involving native coronary artery of native heart without angina pectoris 04/22/2018  . Chronic combined systolic and diastolic heart failure ([place]) 03/16/2017  . LBBB (left bundle branch block) 03/16/2017  . NICM (nonischemic cardiomyopathy) ([place]) 03/16/2017  . ICD: Medtronic MRI Quad CRTD (Bi-V ICD) implantation 03/16/2017 [name] 03/16/2017  . Polyp of sigmoid colon   . BRBPR (bright red blood per rectum)   . Dysphagia   . Gastroesophageal reflux disease   .  Post PTCA 10/28/2016  . Heart failure with reduced ejection fraction, NYHA class III ([place]) 10/24/2016  . S/P right inguinal hernia repair 02/16/2013   Past Medical History:  Diagnosis Date  . Bursitis of right shoulder   . CHF (congestive heart failure) ([place])    [name] 10/24/2016  . Chronic combined systolic and diastolic heart failure ([place]) 03/16/2017  . Dilated cardiomyopathy ([place])    [name] 10/24/2016  . Dysrhythmia 1995   "VT" per patient- states had neg stress test and no problems since  . Encounter for assessment of automatic implantable cardioverter-defibrillator (AICD) 06/17/2018  . Heart murmur    "when I was young"  . History of kidney stones   . Hypercholesterolemia   . ICD: Medtronic MRI Quad CRTD (Bi-V ICD) implantation 03/16/2017 [name] 03/16/2017   Scheduled Remote ICD check   8.7.20: No VHR episodes. No mode switches. Normal health trends. Trans-thoracic impedance trends and the OptiVol Fluid Index do no present significant abnormalities. Battery longevity is 6.5-8.3 years. RA pacing is 8.3 %, RV pacing is 99.9 %, and LV pacing is 99.9 %.  Clinic: 05/18/17  . NICM (nonischemic cardiomyopathy) ([place]) 03/16/2017   Past Surgical History:  Procedure Laterality Date  . BIV ICD INSERTION CRT-D N/A 03/16/2017   Procedure: BIV ICD INSERTION CRT-D;  Surgeon: Deboraha Sprang, MD;  Location: [facility];  Service: Cardiovascular;  Laterality: N/A;  . COLONOSCOPY WITH PROPOFOL N/A 03/10/2017   Procedure: COLONOSCOPY WITH PROPOFOL;  Surgeon: Mauri Pole, MD;  Location: [facility];  Service: Endoscopy;  Laterality: N/A;  . CORONARY ANGIOPLASTY WITH STENT PLACEMENT  10/28/2016  . CORONARY STENT INTERVENTION N/A 10/28/2016   Procedure: CORONARY STENT INTERVENTION;  Surgeon: [name], MD;  Location: [place] [facility];  Service: Cardiovascular;  Laterality: N/A;  . ESOPHAGOGASTRODUODENOSCOPY (EGD) WITH PROPOFOL N/A 03/10/2017   Procedure: ESOPHAGOGASTRODUODENOSCOPY  (EGD) WITH PROPOFOL;  Surgeon: Mauri Pole, MD;  Location: [facility];  Service: Endoscopy;  Laterality: N/A;  . INGUINAL HERNIA REPAIR Right 02/16/2013   Procedure: RIGHT INGUINAL HERNIA REPAIR ;  Surgeon: [name], MD;  Location: [facility];  Service: General;  Laterality: Right;  With MESH  . INGUINAL HERNIA REPAIR Right 02/16/2013  . INTRAVASCULAR PRESSURE WIRE/FFR STUDY N/A 10/28/2016   Procedure: INTRAVASCULAR PRESSURE WIRE/FFR STUDY;  Surgeon: [name], MD;  Location: [facility];  Service: Cardiovascular;  Laterality: N/A;  . RIGHT/LEFT HEART CATH AND CORONARY ANGIOGRAPHY N/A 10/28/2016   Procedure: RIGHT/LEFT HEART CATH AND CORONARY ANGIOGRAPHY;  Surgeon: [name], MD;  Location: [facility]  INVASIVE CV LAB;  Service: Cardiovascular;  Laterality: N/A;  . ULTRASOUND GUIDANCE FOR VASCULAR ACCESS  10/28/2016   Procedure: Ultrasound Guidance For Vascular Access;  Surgeon: [name], MD;  Location: [place] [facility];  Service: Cardiovascular;;   No Known Allergies Prior to Admission medications   Medication Sig Start Date End Date Taking? Authorizing Provider  aspirin 81 MG chewable tablet Chew 1 tablet (81 mg total) by mouth daily. 10/29/16  Yes Patwardhan, Manish J, MD  bisoprolol (ZEBETA) 5 MG tablet TAKE 1 TABLET BY MOUTH EVERY DAY 08/11/18  Yes Patwardhan, Manish J, MD  ENTRESTO 97-103 MG TAKE 1 TABLET BY MOUTH TWICE DAILY 07/05/18  Yes Patwardhan, Manish J, MD  eszopiclone (LUNESTA) 1 MG TABS tablet Take 1 tablet by mouth as needed. 04/07/18  Yes [provider]  furosemide (LASIX) 40 MG tablet Take 1 tablet (40 mg total) by mouth as needed for edema. Patient taking differently: Take 40 mg by mouth as needed.  10/28/16  Yes Patwardhan, Reynold Bowen, MD  HYDROcodone-acetaminophen (NORCO) 10-325 MG tablet Take 1 tablet by mouth every 4 (four) hours as needed for moderate pain.  09/11/16  Yes [provider]  nicotine polacrilex  (NICORETTE) 2 MG gum Take 2 mg by mouth as needed for smoking cessation.   Yes [provider]  omeprazole (PRILOSEC) 20 MG capsule Take 1 capsule (20 mg total) by mouth daily. 09/22/18  Yes [name], MD  potassium chloride SA (K-DUR) 20 MEQ tablet TAKE 1 TABLET BY MOUTH DAILY 08/04/18  Yes Patwardhan, Manish J, MD  rosuvastatin (CRESTOR) 20 MG tablet TAKE 1 TABLET BY MOUTH DAILY 07/13/18  Yes Patwardhan, Manish J, MD  spironolactone (ALDACTONE) 25 MG tablet TAKE 1 TABLET BY MOUTH EVERY DAY 06/07/18  Yes Patwardhan, Reynold Bowen, MD   Social History   Socioeconomic History  . Marital status: Married    Spouse name: Not on file  . Number of children: 2  . Years of education: Not on file  . Highest education level: Not on file  Occupational History  . Not on file  Social Needs  . Financial resource strain: Not on file  . Food insecurity    Worry: Not on file    Inability: Not on file  . Transportation needs    Medical: Not on file    Non-medical: Not on file  Tobacco Use  . Smoking status: Former Smoker    Packs/day: 1.00    Years: 12.00    Pack years: 12.00    Types: Cigarettes, Cigars    Quit date: 02/16/2011    Years since quitting: 7.6  . Smokeless tobacco: Former Systems developer    Types: Snuff  . Tobacco comment: 10/28/2016 'chewed for a couple years after I quit smoking"  Substance and Sexual Activity  . Alcohol use: No    Comment: none since 9/11  . Drug use: No  . Sexual activity: Not Currently  Lifestyle  . Physical activity    Days per week: Not on file    Minutes per session: Not on file  . Stress: Not on file  Relationships  . Social Herbalist on phone: Not on file    Gets together: Not on file    Attends religious service: Not on file    Active member of club or organization: Not on file    Attends meetings of clubs or organizations: Not on file    Relationship status: Not on file  .  Intimate partner violence    Fear of current or ex partner:  Not on file    Emotionally abused: Not on file    Physically abused: Not on file    Forced sexual activity: Not on file  Other Topics Concern  . Not on file  Social History Narrative  . Not on file     Review of Systems Per HPI.     Objective:   Physical Exam Vitals signs reviewed.  Constitutional:      General: He is not in acute distress.    Appearance: He is well-developed.  HENT:     Head: Normocephalic and atraumatic.  Eyes:     Pupils: Pupils are equal, round, and reactive to light.  Neck:     Vascular: No carotid bruit or JVD.  Cardiovascular:     Rate and Rhythm: Normal rate and regular rhythm.     Heart sounds: Normal heart sounds. No murmur.  Pulmonary:     Effort: Pulmonary effort is normal.     Breath sounds: Normal breath sounds. No rales.  Abdominal:     Tenderness: There is abdominal tenderness (minimal lower. improved from prior exam,. ).  Skin:    General: Skin is warm and dry.  Neurological:     Mental Status: He is alert and oriented to person, place, and time.    Vitals:   09/30/18 1505  BP: 114/65  Pulse: 66  Temp: 98.7 F (37.1 C)  TempSrc: Oral  SpO2: 98%  Weight: 184 lb 12.8 oz (83.8 kg)      Results for orders placed or performed in visit on 09/30/18  CBC  Result Value Ref Range   WBC 6.1 3.4 - 10.8 x10E3/uL   RBC 3.90 (L) 4.14 - 5.80 x10E6/uL   Hemoglobin 11.4 (L) 13.0 - 17.7 g/dL   Hematocrit 34.5 (L) 37.5 - 51.0 %   MCV 89 79 - 97 fL   MCH 29.2 26.6 - 33.0 pg   MCHC 33.0 31.5 - 35.7 g/dL   RDW 13.7 11.6 - 15.4 %   Platelets 225 150 - 450 x10E3/uL  POCT urinalysis dipstick  Result Value Ref Range   Color, UA yellow yellow   Clarity, UA clear clear   Glucose, UA negative negative mg/dL   Bilirubin, UA negative negative   Ketones, POC UA negative negative mg/dL   Spec Grav, UA 1.015 1.010 - 1.025   Blood, UA trace-intact (A) negative   pH, UA 6.5 5.0 - 8.0   Protein Ur, POC negative negative mg/dL   Urobilinogen, UA  0.2 0.2 or 1.0 E.U./dL   Nitrite, UA Negative Negative   Leukocytes, UA Negative Negative       Assessment & Plan:   Miguel Pena is a 54 y.o. male Lower abdominal pain - Plan: POCT urinalysis dipstick, CBC, CANCELED: POCT CBC  Enteritis  History of gastritis  Esophageal stenosis  Internal hemorrhoids - Plan: CANCELED: POCT CBC  BRBPR (bright red blood per rectum) - Plan: CBC, CANCELED: POCT CBC  Overall significant improvement in abdominal symptoms.  Enteritis noted on CT but again symptomatically improved.  No fever, no leukocytosis, hemoglobin similar to previous reading.  Has noted association with abdominal discomfort and use of nicotine gum, less with decreased use.  -Continue to minimize nicotine gum as possible as well as caffeine/sodas. bland diet, continue omeprazole, follow-up planned with gastroenterology in the next week.  ER/RTC precautions if any acute worsening.   History of hemorrhoids, likely cause  of bright red blood per rectum, especially straining.  Bowel regimen discussed, stool softener, and follow-up with gastroenterology as planned.  No orders of the defined types were placed in this encounter.  Patient Instructions    Avoid straining with bowel movements, stool softener like colace may help. Discuss with gastroenterology as well.   Continue omeprazole once per day, try to minimize nicotine gum use and sodas for now as those worsened your symptoms. Keep follow up with gastroenterologist next week but recheck here or in ER if any worsening pain/symptoms.    Hemorrhoids Hemorrhoids are swollen veins in and around the rectum or anus. There are two types of hemorrhoids:  Internal hemorrhoids. These occur in the veins that are just inside the rectum. They may poke through to the outside and become irritated and painful.  External hemorrhoids. These occur in the veins that are outside the anus and can be felt as a painful swelling or hard lump near the  anus. Most hemorrhoids do not cause serious problems, and they can be managed with home treatments such as diet and lifestyle changes. If home treatments do not help the symptoms, procedures can be done to shrink or remove the hemorrhoids. What are the causes? This condition is caused by increased pressure in the anal area. This pressure may result from various things, including:  Constipation.  Straining to have a bowel movement.  Diarrhea.  Pregnancy.  Obesity.  Sitting for long periods of time.  Heavy lifting or other activity that causes you to strain.  Anal sex.  Riding a bike for a long period of time. What are the signs or symptoms? Symptoms of this condition include:  Pain.  Anal itching or irritation.  Rectal bleeding.  Leakage of stool (feces).  Anal swelling.  One or more lumps around the anus. How is this diagnosed? This condition can often be diagnosed through a visual exam. Other exams or tests may also be done, such as:  An exam that involves feeling the rectal area with a gloved hand (digital rectal exam).  An exam of the anal canal that is done using a small tube (anoscope).  A blood test, if you have lost a significant amount of blood.  A test to look inside the colon using a flexible tube with a camera on the end (sigmoidoscopy or colonoscopy). How is this treated? This condition can usually be treated at home. However, various procedures may be done if dietary changes, lifestyle changes, and other home treatments do not help your symptoms. These procedures can help make the hemorrhoids smaller or remove them completely. Some of these procedures involve surgery, and others do not. Common procedures include:  Rubber band ligation. Rubber bands are placed at the base of the hemorrhoids to cut off their blood supply.  Sclerotherapy. Medicine is injected into the hemorrhoids to shrink them.  Infrared coagulation. A type of light energy is used to  get rid of the hemorrhoids.  Hemorrhoidectomy surgery. The hemorrhoids are surgically removed, and the veins that supply them are tied off.  Stapled hemorrhoidopexy surgery. The surgeon staples the base of the hemorrhoid to the rectal wall. Follow these instructions at home: Eating and drinking   Eat foods that have a lot of fiber in them, such as whole grains, beans, nuts, fruits, and vegetables.  Ask your health care provider about taking products that have added fiber (fiber supplements).  Reduce the amount of fat in your diet. You can do this by eating low-fat  dairy products, eating less red meat, and avoiding processed foods.  Drink enough fluid to keep your urine pale yellow. Managing pain and swelling   Take warm sitz baths for 20 minutes, 3-4 times a day to ease pain and discomfort. You may do this in a bathtub or using a portable sitz bath that fits over the toilet.  If directed, apply ice to the affected area. Using ice packs between sitz baths may be helpful. ? Put ice in a plastic bag. ? Place a towel between your skin and the bag. ? Leave the ice on for 20 minutes, 2-3 times a day. General instructions  Take over-the-counter and prescription medicines only as told by your health care provider.  Use medicated creams or suppositories as told.  Get regular exercise. Ask your health care provider how much and what kind of exercise is best for you. In general, you should do moderate exercise for at least 30 minutes on most days of the week (150 minutes each week). This can include activities such as walking, biking, or yoga.  Go to the bathroom when you have the urge to have a bowel movement. Do not wait.  Avoid straining to have bowel movements.  Keep the anal area dry and clean. Use wet toilet paper or moist towelettes after a bowel movement.  Do not sit on the toilet for long periods of time. This increases blood pooling and pain.  Keep all follow-up visits as told  by your health care provider. This is important. Contact a health care provider if you have:  Increasing pain and swelling that are not controlled by treatment or medicine.  Difficulty having a bowel movement, or you are unable to have a bowel movement.  Pain or inflammation outside the area of the hemorrhoids. Get help right away if you have:  Uncontrolled bleeding from your rectum. Summary  Hemorrhoids are swollen veins in and around the rectum or anus.  Most hemorrhoids can be managed with home treatments such as diet and lifestyle changes.  Taking warm sitz baths can help ease pain and discomfort.  In severe cases, procedures or surgery can be done to shrink or remove the hemorrhoids. This information is not intended to replace advice given to you by your health care provider. Make sure you discuss any questions you have with your health care provider. Document Released: 12/21/1999 Document Revised: 12/31/2017 Document Reviewed: 05/14/2017 Elsevier Patient Education  [employer].   If you have lab work done today you will be contacted with your lab results within the next 2 weeks.  If you have not heard from [place] then please contact us. The fastest way to get your results is to register for My Chart.   IF you received an x-ray today, you will receive an invoice from [facility]. Please contact [facility] at [phone] with questions or concerns regarding your invoice.   IF you received labwork today, you will receive an invoice from [facility]. Please contact [facility] at [phone] with questions or concerns regarding your invoice.   Our billing staff will not be able to assist you with questions regarding bills from these companies.  You will be contacted with the lab results as soon as they are available. The fastest way to get your results is to activate your My Chart account. Instructions are located on the last page of this paperwork. If you have  not heard from [place] regarding the results in 2 weeks, please contact this office.  Signed,   Merri Ray, MD [facility].  10/02/18 9:52 AM

## 2018-09-30 NOTE — Patient Instructions (Addendum)
Avoid straining with bowel movements, stool softener like colace may help. Discuss with gastroenterology as well.   Continue omeprazole once per day, try to minimize nicotine gum use and sodas for now as those worsened your symptoms. Keep follow up with gastroenterologist next week but recheck here or in ER if any worsening pain/symptoms.    Hemorrhoids Hemorrhoids are swollen veins in and around the rectum or anus. There are two types of hemorrhoids:  Internal hemorrhoids. These occur in the veins that are just inside the rectum. They may poke through to the outside and become irritated and painful.  External hemorrhoids. These occur in the veins that are outside the anus and can be felt as a painful swelling or hard lump near the anus. Most hemorrhoids do not cause serious problems, and they can be managed with home treatments such as diet and lifestyle changes. If home treatments do not help the symptoms, procedures can be done to shrink or remove the hemorrhoids. What are the causes? This condition is caused by increased pressure in the anal area. This pressure may result from various things, including:  Constipation.  Straining to have a bowel movement.  Diarrhea.  Pregnancy.  Obesity.  Sitting for long periods of time.  Heavy lifting or other activity that causes you to strain.  Anal sex.  Riding a bike for a long period of time. What are the signs or symptoms? Symptoms of this condition include:  Pain.  Anal itching or irritation.  Rectal bleeding.  Leakage of stool (feces).  Anal swelling.  One or more lumps around the anus. How is this diagnosed? This condition can often be diagnosed through a visual exam. Other exams or tests may also be done, such as:  An exam that involves feeling the rectal area with a gloved hand (digital rectal exam).  An exam of the anal canal that is done using a small tube (anoscope).  A blood test, if you have lost a  significant amount of blood.  A test to look inside the colon using a flexible tube with a camera on the end (sigmoidoscopy or colonoscopy). How is this treated? This condition can usually be treated at home. However, various procedures may be done if dietary changes, lifestyle changes, and other home treatments do not help your symptoms. These procedures can help make the hemorrhoids smaller or remove them completely. Some of these procedures involve surgery, and others do not. Common procedures include:  Rubber band ligation. Rubber bands are placed at the base of the hemorrhoids to cut off their blood supply.  Sclerotherapy. Medicine is injected into the hemorrhoids to shrink them.  Infrared coagulation. A type of light energy is used to get rid of the hemorrhoids.  Hemorrhoidectomy surgery. The hemorrhoids are surgically removed, and the veins that supply them are tied off.  Stapled hemorrhoidopexy surgery. The surgeon staples the base of the hemorrhoid to the rectal wall. Follow these instructions at home: Eating and drinking   Eat foods that have a lot of fiber in them, such as whole grains, beans, nuts, fruits, and vegetables.  Ask your health care provider about taking products that have added fiber (fiber supplements).  Reduce the amount of fat in your diet. You can do this by eating low-fat dairy products, eating less red meat, and avoiding processed foods.  Drink enough fluid to keep your urine pale yellow. Managing pain and swelling   Take warm sitz baths for 20 minutes, 3-4 times a day to ease  pain and discomfort. You may do this in a bathtub or using a portable sitz bath that fits over the toilet.  If directed, apply ice to the affected area. Using ice packs between sitz baths may be helpful. ? Put ice in a plastic bag. ? Place a towel between your skin and the bag. ? Leave the ice on for 20 minutes, 2-3 times a day. General instructions  Take over-the-counter and  prescription medicines only as told by your health care provider.  Use medicated creams or suppositories as told.  Get regular exercise. Ask your health care provider how much and what kind of exercise is best for you. In general, you should do moderate exercise for at least 30 minutes on most days of the week (150 minutes each week). This can include activities such as walking, biking, or yoga.  Go to the bathroom when you have the urge to have a bowel movement. Do not wait.  Avoid straining to have bowel movements.  Keep the anal area dry and clean. Use wet toilet paper or moist towelettes after a bowel movement.  Do not sit on the toilet for long periods of time. This increases blood pooling and pain.  Keep all follow-up visits as told by your health care provider. This is important. Contact a health care provider if you have:  Increasing pain and swelling that are not controlled by treatment or medicine.  Difficulty having a bowel movement, or you are unable to have a bowel movement.  Pain or inflammation outside the area of the hemorrhoids. Get help right away if you have:  Uncontrolled bleeding from your rectum. Summary  Hemorrhoids are swollen veins in and around the rectum or anus.  Most hemorrhoids can be managed with home treatments such as diet and lifestyle changes.  Taking warm sitz baths can help ease pain and discomfort.  In severe cases, procedures or surgery can be done to shrink or remove the hemorrhoids. This information is not intended to replace advice given to you by your health care provider. Make sure you discuss any questions you have with your health care provider. Document Released: 12/21/1999 Document Revised: 12/31/2017 Document Reviewed: 05/14/2017 Elsevier Patient Education  [employer].   If you have lab work done today you will be contacted with your lab results within the next 2 weeks.  If you have not heard from [place] then please contact  us. The fastest way to get your results is to register for My Chart.   IF you received an x-ray today, you will receive an invoice from [facility]. Please contact [facility] at [phone] with questions or concerns regarding your invoice.   IF you received labwork today, you will receive an invoice from [facility]. Please contact [facility] at [phone] with questions or concerns regarding your invoice.   Our billing staff will not be able to assist you with questions regarding bills from these companies.  You will be contacted with the lab results as soon as they are available. The fastest way to get your results is to activate your My Chart account. Instructions are located on the last page of this paperwork. If you have not heard from [place] regarding the results in 2 weeks, please contact this office.

## 2018-10-01 LAB — CBC
Hematocrit: 34.5 % — ABNORMAL LOW (ref 37.5–51.0)
Hemoglobin: 11.4 g/dL — ABNORMAL LOW (ref 13.0–17.7)
MCH: 29.2 pg (ref 26.6–33.0)
MCHC: 33 g/dL (ref 31.5–35.7)
MCV: 89 fL (ref 79–97)
Platelets: 225 10*3/uL (ref 150–450)
RBC: 3.9 x10E6/uL — ABNORMAL LOW (ref 4.14–5.80)
RDW: 13.7 % (ref 11.6–15.4)
WBC: 6.1 10*3/uL (ref 3.4–10.8)

## 2018-10-02 ENCOUNTER — Encounter: Payer: Self-pay | Admitting: Family Medicine

## 2018-10-03 ENCOUNTER — Other Ambulatory Visit: Payer: Self-pay | Admitting: Cardiology

## 2018-10-06 ENCOUNTER — Ambulatory Visit: Payer: 59 | Admitting: Physician Assistant

## 2018-10-06 ENCOUNTER — Telehealth: Payer: Self-pay | Admitting: Gastroenterology

## 2018-10-06 ENCOUNTER — Telehealth: Payer: Self-pay | Admitting: Physician Assistant

## 2018-10-06 ENCOUNTER — Encounter: Payer: Self-pay | Admitting: Physician Assistant

## 2018-10-06 ENCOUNTER — Other Ambulatory Visit (INDEPENDENT_AMBULATORY_CARE_PROVIDER_SITE_OTHER): Payer: 59

## 2018-10-06 ENCOUNTER — Telehealth: Payer: Self-pay

## 2018-10-06 VITALS — BP 124/62 | HR 70 | Temp 97.5°F | Ht 70.0 in | Wt 186.0 lb

## 2018-10-06 DIAGNOSIS — R935 Abnormal findings on diagnostic imaging of other abdominal regions, including retroperitoneum: Secondary | ICD-10-CM

## 2018-10-06 DIAGNOSIS — K625 Hemorrhage of anus and rectum: Secondary | ICD-10-CM | POA: Diagnosis not present

## 2018-10-06 DIAGNOSIS — R1310 Dysphagia, unspecified: Secondary | ICD-10-CM

## 2018-10-06 DIAGNOSIS — K219 Gastro-esophageal reflux disease without esophagitis: Secondary | ICD-10-CM

## 2018-10-06 DIAGNOSIS — Z7901 Long term (current) use of anticoagulants: Secondary | ICD-10-CM

## 2018-10-06 DIAGNOSIS — R109 Unspecified abdominal pain: Secondary | ICD-10-CM

## 2018-10-06 LAB — C-REACTIVE PROTEIN: CRP: <1 mg/dL (ref 0.5–20.0)

## 2018-10-06 LAB — SEDIMENTATION RATE: Sed Rate: 31 mm/hr — ABNORMAL HIGH (ref 0–20)

## 2018-10-06 MED ORDER — DICYCLOMINE HCL 10 MG PO CAPS: 10.0000 mg | ORAL_CAPSULE | Freq: Two times a day (BID) | ORAL | 0 refills | Status: DC

## 2018-10-06 MED ORDER — OMEPRAZOLE 40 MG PO CPDR: 40.0000 mg | DELAYED_RELEASE_CAPSULE | Freq: Two times a day (BID) | ORAL | 1 refills | Status: DC

## 2018-10-06 NOTE — Telephone Encounter (Signed)
This is a pt of Dr. Nadyne Coombes and Virgina Jock.   I will forward to them for guidance on holding antiplatelet therapy and remove from our preop pool.

## 2018-10-06 NOTE — Patient Instructions (Addendum)
If you are age 54 or younger, your body mass index should be between 19-25. Your Body mass index is 26.69 kg/m. If this is out of the aformentioned range listed, please consider follow up with your Primary Care Provider.   Your provider has requested that you go to the basement level for lab work before leaving today. Press "B" on the elevator. The lab is located at the first door on the left as you exit the elevator.  We have sent the following medications to your pharmacy for you to pick up at your convenience: Dicyclomine, Omeprazole   You have been scheduled for an endoscopy. Please follow written instructions given to you at your visit today. If you use inhalers (even only as needed), please bring them with you on the day of your procedure.  [place] 10/19/1965 [id number]   1. 10/15/18 Seven (7) days prior to capsule endoscopy stop taking iron supplements and carafate.  2. 10/20/18 Two (2) days prior to capsule endoscopy stop taking aspirin or any arthritis drugs.  3. 10/21/18  Day before capsule endoscopy purchase a 238 gram bottle of Miralax from the laxative section of your drug store, and a 32 oz. bottle of Gatorade (no red).    4. 10/21/18 One (1) day prior to capsule endoscopy: a) Stop smoking. b) Eat a regular diet until 12:00 Noon. c) After 12:00 Noon take only the following: Black coffee  Jell-O (no fruit or red Jell-o) Water   Bouillon (chicken or beef) 7-Up   Cranberry Juice Tea   Kool-Aid Popsicle (not red) Sprite   Coke Ginger Ale  Pepsi Mountain Dew Gatorade d) At 6:00 pm the evening before your appointment, drink 7 capfuls (105 grams) of Miralax with 32 oz. Gatorade. Drink 8 oz every 15 minutes until gone. e) Nothing to eat or drink after midnight except medications with a sip of water.  5. 10/22/18  Day of capsule endoscopy: Wear loose two-piece clothing to your exam. No medications for 2 hours  prior to your test.  Please arrive at [facility] at 8:30 am.    For any questions: Call [facility] at [phone] and ask to speak with one of the capsule endoscopy nurses.    Thank you for choosing me and [facility].  Dennison Bulla

## 2018-10-06 NOTE — Telephone Encounter (Signed)
Request for surgical clearance:     Endoscopy Procedure  What type of surgery is being performed?     Endoscopy   When is this surgery scheduled?     12/06/18   What type of clearance is required ?   Pharmacy  Are there any medications that need to be held prior to surgery and how long? Plavix x5 days   Practice name and name of physician performing surgery?      [facility]  What is your office phone and fax number?      Phone- [phone]  [phone]  Anesthesia type (None, local, MAC, general) ?       MAC

## 2018-10-06 NOTE — Progress Notes (Signed)
Chief Complaint: Abdominal pain, bloating, rectal bleeding  HPI:    Miguel Pena is a 53 year old male with a past medical history as listed below including CHF last documented EF October 2018 was 25%, status post drug-eluting stent placement October 2018 maintained on Plavix, known to Dr. Silverio Decamp, who was referred to me by Miguel Agreste, MD for a complaint of abdominal pain, bloating, rectal bleeding.     03/02/2017 patient seen in clinic by me for anemia.  At that time described bright red blood with his bowel movements.  As well as right side" swelling", typically worse after eating.  Patient was scheduled for an EGD and colonoscopy in the hospital which were performed by Dr. Silverio Decamp.    03/10/2017 EGD Dr. Silverio Decamp with LA grade C reflux and candidiasis esophagitis, benign-appearing esophageal stenosis, gastritis with hemorrhage, duodenal erosions without bleeding otherwise normal.  Started on Nexium 40 twice daily.  Colonoscopy on the same day with moderate diverticulosis in the sigmoid and descending colon, narrowing of the colon in association with diverticular opening, peridiverticular erythema, 1 5 mm polyp in the sigmoid colon and nonbleeding internal hemorrhoids.  Pathology showed chronic gastritis and reflux esophagitis negative for H. pylori or dysplasia.  Polyp removed was hyperplastic.  Recall colonoscopy recommended in 10 years.    CT abdomen pelvis with contrast 09/29/2018 with inflammatory changes and thickening of the distal and terminal ileum which may represent infectious enteritis or an inflammatory bowel disease.  No bowel obstruction, normal appendix.  Sigmoid diverticulosis and cholelithiasis.    09/22/2018 CMP was normal.  09/30/2018 CBC with a hemoglobin of 11.4 which is patient's baseline.    Today, the patient presents to clinic with a constellation of GI symptoms.  It should also be noted he is a poor historian.  He tells me he has had a "stomachache" for the past 6 to 8  months.  Tells me that this burning pain which seems to radiate all around his umbilicus can be worse if he drinks a [facility] or eats greasy or spicy food almost immediately or also when he is lifting something at work or even sometimes after urinating.      He continues on his Omeprazole 20 mg daily and denies any overt reflux symptoms but does tell me that when he swallows food sometimes gets stuck in his throat on the way down and he has to chew very well and take very small bites.      Along with this radiates back-and-forth from diarrhea to constipation and tells me that sometimes he has "pain when it is coming through my bowel".      Also continues to describe some days of bright red blood in his stool and tells me that he has to "tuck my hemorrhoids back in".     Denies fever, chills or known family history of Crohn's or ulcerative colitis.     Past Medical History:  Diagnosis Date  . Bursitis of right shoulder   . CHF (congestive heart failure) ([place])    [name] 10/24/2016  . Chronic combined systolic and diastolic heart failure ([place]) 03/16/2017  . Dilated cardiomyopathy ([place])    [name] 10/24/2016  . Dysrhythmia 1995   "VT" per patient- states had neg stress test and no problems since  . Encounter for assessment of automatic implantable cardioverter-defibrillator (AICD) 06/17/2018  . Heart murmur    "when I was young"  . History of kidney stones   . Hypercholesterolemia   . ICD: Medtronic  MRI Quad CRTD (Bi-V ICD) implantation 03/16/2017 [name] 03/16/2017   Scheduled Remote ICD check   8.7.20: No VHR episodes. No mode switches. Normal health trends. Trans-thoracic impedance trends and the OptiVol Fluid Index do no present significant abnormalities. Battery longevity is 6.5-8.3 years. RA pacing is 8.3 %, RV pacing is 99.9 %, and LV pacing is 99.9 %.  Clinic: 05/18/17  . NICM (nonischemic cardiomyopathy) ([place]) 03/16/2017    Past Surgical History:  Procedure Laterality Date  . BIV ICD  INSERTION CRT-D N/A 03/16/2017   Procedure: BIV ICD INSERTION CRT-D;  Surgeon: Deboraha Sprang, MD;  Location: [place] [facility];  Service: Cardiovascular;  Laterality: N/A;  . COLONOSCOPY WITH PROPOFOL N/A 03/10/2017   Procedure: COLONOSCOPY WITH PROPOFOL;  Surgeon: Mauri Pole, MD;  Location: [facility];  Service: Endoscopy;  Laterality: N/A;  . CORONARY ANGIOPLASTY WITH STENT PLACEMENT  10/28/2016  . CORONARY STENT INTERVENTION N/A 10/28/2016   Procedure: CORONARY STENT INTERVENTION;  Surgeon: [name], MD;  Location: [place] [facility];  Service: Cardiovascular;  Laterality: N/A;  . ESOPHAGOGASTRODUODENOSCOPY (EGD) WITH PROPOFOL N/A 03/10/2017   Procedure: ESOPHAGOGASTRODUODENOSCOPY (EGD) WITH PROPOFOL;  Surgeon: Mauri Pole, MD;  Location: [facility];  Service: Endoscopy;  Laterality: N/A;  . INGUINAL HERNIA REPAIR Right 02/16/2013   Procedure: RIGHT INGUINAL HERNIA REPAIR ;  Surgeon: [name], MD;  Location: [facility];  Service: General;  Laterality: Right;  With MESH  . INGUINAL HERNIA REPAIR Right 02/16/2013  . INTRAVASCULAR PRESSURE WIRE/FFR STUDY N/A 10/28/2016   Procedure: INTRAVASCULAR PRESSURE WIRE/FFR STUDY;  Surgeon: [name], MD;  Location: [place] [facility];  Service: Cardiovascular;  Laterality: N/A;  . RIGHT/LEFT HEART CATH AND CORONARY ANGIOGRAPHY N/A 10/28/2016   Procedure: RIGHT/LEFT HEART CATH AND CORONARY ANGIOGRAPHY;  Surgeon: [name], MD;  Location: [place] [facility];  Service: Cardiovascular;  Laterality: N/A;  . ULTRASOUND GUIDANCE FOR VASCULAR ACCESS  10/28/2016   Procedure: Ultrasound Guidance For Vascular Access;  Surgeon: [name], MD;  Location: [place] [facility];  Service: Cardiovascular;;    Current Outpatient Medications  Medication Sig Dispense Refill  . aspirin 81 MG chewable tablet Chew 1 tablet (81 mg total) by mouth daily. 90 tablet 3  . bisoprolol (ZEBETA) 5 MG tablet TAKE 1 TABLET  BY MOUTH EVERY DAY 90 tablet 3  . ENTRESTO 97-103 MG TAKE 1 TABLET BY MOUTH TWICE DAILY 180 tablet 0  . eszopiclone (LUNESTA) 1 MG TABS tablet Take 1 tablet by mouth as needed.    . furosemide (LASIX) 40 MG tablet Take 1 tablet (40 mg total) by mouth as needed for edema. (Patient taking differently: Take 40 mg by mouth as needed. ) 30 tablet 0  . HYDROcodone-acetaminophen (NORCO) 10-325 MG tablet Take 1 tablet by mouth every 4 (four) hours as needed for moderate pain.     . nicotine polacrilex (NICORETTE) 2 MG gum Take 2 mg by mouth as needed for smoking cessation.    Marland Kitchen omeprazole (PRILOSEC) 20 MG capsule Take 1 capsule (20 mg total) by mouth daily. 30 capsule 1  . potassium chloride SA (K-DUR) 20 MEQ tablet TAKE 1 TABLET BY MOUTH DAILY 90 tablet 3  . rosuvastatin (CRESTOR) 20 MG tablet TAKE 1 TABLET BY MOUTH DAILY 90 tablet 2  . spironolactone (ALDACTONE) 25 MG tablet TAKE 1 TABLET BY MOUTH EVERY DAY 90 tablet 1   No current facility-administered medications for this visit.     Allergies  as of 10/06/2018  . (No Known Allergies)    Family History  Problem Relation Age of Onset  . Heart attack Father     Social History   Socioeconomic History  . Marital status: Married    Spouse name: Not on file  . Number of children: 2  . Years of education: Not on file  . Highest education level: Not on file  Occupational History  . Not on file  Social Needs  . Financial resource strain: Not on file  . Food insecurity    Worry: Not on file    Inability: Not on file  . Transportation needs    Medical: Not on file    Non-medical: Not on file  Tobacco Use  . Smoking status: Former Smoker    Packs/day: 1.00    Years: 12.00    Pack years: 12.00    Types: Cigarettes, Cigars    Quit date: 02/16/2011    Years since quitting: 7.6  . Smokeless tobacco: Former Systems developer    Types: Snuff  . Tobacco comment: 10/28/2016 'chewed for a couple years after I quit smoking"  Substance and Sexual Activity   . Alcohol use: No    Comment: none since 9/11  . Drug use: No  . Sexual activity: Not Currently  Lifestyle  . Physical activity    Days per week: Not on file    Minutes per session: Not on file  . Stress: Not on file  Relationships  . Social Herbalist on phone: Not on file    Gets together: Not on file    Attends religious service: Not on file    Active member of club or organization: Not on file    Attends meetings of clubs or organizations: Not on file    Relationship status: Not on file  . Intimate partner violence    Fear of current or ex partner: Not on file    Emotionally abused: Not on file    Physically abused: Not on file    Forced sexual activity: Not on file  Other Topics Concern  . Not on file  Social History Narrative  . Not on file    Review of Systems:    Constitutional: No weight loss, fever or chills Cardiovascular: No chest pain Respiratory: No SOB  Gastrointestinal: See HPI and otherwise negative   Physical Exam:  Vital signs: BP 124/62   Pulse 70   Temp (!) 97.5 F (36.4 C)   Ht 5' 10"  (1.778 m)   Wt 186 lb (84.4 kg)   BMI 26.69 kg/m   Constitutional:   Pleasant Caucasian male appears to be in NAD, Well developed, Well nourished, alert and cooperative Respiratory: Respirations even and unlabored. Lungs clear to auscultation bilaterally.   No wheezes, crackles, or rhonchi.  Cardiovascular: Normal S1, S2. No MRG. Regular rate and rhythm. No peripheral edema, cyanosis or pallor.  Gastrointestinal:  Soft, nondistended, mild tenderness around umbilicus, some worse toward epigastrum, no RLQ ttp. No rebound or guarding. Normal bowel sounds. No appreciable masses or hepatomegaly. Rectal:  Not performed.  Psychiatric: Demonstrates good judgement and reason without abnormal affect or behaviors.  RELEVANT LABS AND IMAGING and See HPI: CBC    Component Value Date/Time   WBC 6.1 09/30/2018 1624   WBC 5.2 03/16/2017 0619   RBC 3.90 (L)  09/30/2018 1624   RBC 3.45 (L) 03/16/2017 0619   HGB 11.4 (L) 09/30/2018 1624   HCT 34.5 (L) 09/30/2018 1624  PLT 225 09/30/2018 1624   MCV 89 09/30/2018 1624   MCH 29.2 09/30/2018 1624   MCH 29.9 03/16/2017 0619   MCHC 33.0 09/30/2018 1624   MCHC 33.6 03/16/2017 0619   RDW 13.7 09/30/2018 1624   LYMPHSABS 1.6 09/22/2018 1731   MONOABS 0.5 09/22/2016 1432   EOSABS 0.0 09/22/2018 1731   BASOSABS 0.0 09/22/2018 1731    CMP     Component Value Date/Time   NA 143 09/22/2018 1731   K 4.4 09/22/2018 1731   CL 109 (H) 09/22/2018 1731   CO2 20 09/22/2018 1731   GLUCOSE 103 (H) 09/22/2018 1731   GLUCOSE 110 (H) 03/16/2017 0619   BUN 11 09/22/2018 1731   CREATININE 0.78 09/22/2018 1731   CALCIUM 9.6 09/22/2018 1731   PROT 6.5 09/22/2018 1731   ALBUMIN 4.3 09/22/2018 1731   AST 20 09/22/2018 1731   ALT 19 09/22/2018 1731   ALKPHOS 77 09/22/2018 1731   BILITOT 0.7 09/22/2018 1731   GFRNONAA 102 09/22/2018 1731   GFRAA 118 09/22/2018 1731    Assessment: 1.  Abdominal pain: All around umbilicus, CT recently showing ileitis; consider Crohn's versus colitis versus other 2.  Dysphagia: Stricture and Candida esophagitis seen at time of last EGD, stricture was not dilated due to Candida, likely this is still a stricture 3.  Rectal bleeding: Continues per patient, likely still from internal hemorrhoids, he is interested in banding 4.  Chronic anticoagulation: Status post stent in 2018 on Plavix, EF 25% in 2018 5.  Abnormal CT of the abdomen: Showing ileitis and cholelithiasis 6. GERD  Plan: 1.  Discussed case with Dr. Silverio Decamp.  She recommends a small bowel video capsule endoscopy given ileitis and recent colonoscopy. 2.  Also scheduled patient for an EGD due to dysphagia.  This will likely be with dilation.  This needs to be scheduled in the hospital given patient's decreased EF.  Did discuss risks, benefits, limitations and alternatives and the patient agrees to proceed. 3.  Patient  will need to hold his Plavix for 5 days prior to time procedure.  We will communicate with his prescribing physician to ensure this is acceptable for him. 4.  Ordered CRP and ESR 5.  Prescribed Dicyclomine 10 mg twice daily #60 to be used for the next month 6.  Prescribed Omeprazole 40 mg twice daily, 30-60 minutes before breakfast and dinner #60 to be used for the next month with 1 refill 7.  Patient will then have close follow-up with Dr. Silverio Decamp in the next 2 to 3 weeks.  At that time she can assess hemorrhoids to see if he would be appropriate for banding.  This visit took >40 min in consultation and coordination of care with multiple medical problems.  Ellouise Newer, PA-C [facility] 10/06/2018, 8:51 AM  Cc: Miguel Agreste, MD

## 2018-10-06 NOTE — Telephone Encounter (Signed)
Thanks. Will take care.

## 2018-10-07 ENCOUNTER — Other Ambulatory Visit: Payer: Self-pay

## 2018-10-07 MED ORDER — DICYCLOMINE HCL 10 MG PO CAPS: 10.0000 mg | ORAL_CAPSULE | Freq: Three times a day (TID) | ORAL | 2 refills | Status: DC

## 2018-10-07 NOTE — Telephone Encounter (Signed)
Definitely. He can take TID. Please send in new script for him so he has enough #90 with 2 refills. Thanks-JLL

## 2018-10-07 NOTE — Telephone Encounter (Signed)
Pt called stating that he was returning your call. pls call him again.

## 2018-10-07 NOTE — Telephone Encounter (Signed)
Patient states the Bentyl is helping, but he starts having pain again in the evening. Wanted to know if he can take it 3 times a day (in the evening)

## 2018-10-07 NOTE — Telephone Encounter (Signed)
New Bentyl order sent to patient's pharmacy. 61m TID 90ct. With 2 refills. Called patient and gave instructions for thaking 3 times a day and let him know a new script was sent to his pharmacy

## 2018-10-13 NOTE — Telephone Encounter (Signed)
Ellouise Newer saw patient in the office, according to her note he is on Plavix but if he is currently not on Plavix and is only taking aspirin, he does not have to hold aspirin.  Please advise him to continue aspirin.  Thank you

## 2018-10-13 NOTE — Telephone Encounter (Signed)
Pt informed to continue ASA. Pt voiced understanding and will not hold ASA.

## 2018-10-13 NOTE — Telephone Encounter (Signed)
Per Dr Virgina Jock pt is only taking ASA. Pt has been informed to hold ASA x5 days prior to procedure per Dr Virgina Jock recommendations.

## 2018-10-14 ENCOUNTER — Encounter: Payer: Self-pay | Admitting: Cardiology

## 2018-10-14 NOTE — Telephone Encounter (Signed)
Can you please obtain recent echocardiogram from his cardiologist's office, if his LV EF is improved after biventricular pacer, he may be okay with undergoing procedure at [facility].  Thank you

## 2018-10-15 NOTE — Progress Notes (Signed)
Reviewed and agree with documentation and assessment and plan. K. Veena Mckinley Adelstein , MD   

## 2018-10-22 ENCOUNTER — Other Ambulatory Visit: Payer: Self-pay

## 2018-10-22 ENCOUNTER — Encounter: Payer: Self-pay | Admitting: Gastroenterology

## 2018-10-22 ENCOUNTER — Ambulatory Visit (INDEPENDENT_AMBULATORY_CARE_PROVIDER_SITE_OTHER): Payer: 59 | Admitting: Gastroenterology

## 2018-10-22 DIAGNOSIS — K50018 Crohn's disease of small intestine with other complication: Secondary | ICD-10-CM | POA: Diagnosis not present

## 2018-10-22 DIAGNOSIS — R933 Abnormal findings on diagnostic imaging of other parts of digestive tract: Secondary | ICD-10-CM | POA: Diagnosis not present

## 2018-10-22 NOTE — Patient Instructions (Signed)

## 2018-10-22 NOTE — Progress Notes (Signed)
Patient here for capsule endoscopy. He tolerated procedure well.  He verbalized all written and verbal instructions Lot # [id number] Capsule ID [id number]

## 2018-10-27 ENCOUNTER — Ambulatory Visit: Payer: 59 | Admitting: Cardiology

## 2018-11-03 ENCOUNTER — Encounter: Payer: Self-pay | Admitting: Cardiology

## 2018-11-03 ENCOUNTER — Telehealth: Payer: Self-pay | Admitting: *Deleted

## 2018-11-03 ENCOUNTER — Other Ambulatory Visit: Payer: Self-pay

## 2018-11-03 ENCOUNTER — Ambulatory Visit (INDEPENDENT_AMBULATORY_CARE_PROVIDER_SITE_OTHER): Payer: 59 | Admitting: Cardiology

## 2018-11-03 DIAGNOSIS — I5042 Chronic combined systolic (congestive) and diastolic (congestive) heart failure: Secondary | ICD-10-CM | POA: Diagnosis not present

## 2018-11-03 DIAGNOSIS — I428 Other cardiomyopathies: Secondary | ICD-10-CM | POA: Diagnosis not present

## 2018-11-03 DIAGNOSIS — Z4502 Encounter for adjustment and management of automatic implantable cardiac defibrillator: Secondary | ICD-10-CM | POA: Diagnosis not present

## 2018-11-03 DIAGNOSIS — Z95810 Presence of automatic (implantable) cardiac defibrillator: Secondary | ICD-10-CM

## 2018-11-03 DIAGNOSIS — K50019 Crohn's disease of small intestine with unspecified complications: Secondary | ICD-10-CM

## 2018-11-03 MED ORDER — PREDNISONE 20 MG PO TABS: 40.0000 mg | ORAL_TABLET | Freq: Every day | ORAL | 1 refills | Status: DC

## 2018-11-03 NOTE — Telephone Encounter (Signed)
Complete capsule study with findings of Distal small bowel ulcerations, with severe ulceration,edema,friability in the most distal small bowel. Findings are consistent with Crohn's ileitis. Explained results to patient Will send report to be scanned in    Per Dr Silverio Decamp patient is to take Prednisone 40 mg daily for 2 weeks, then taper by 5 mg weekly  Prescription sent to pharmacy  Patient aware to start   F/U office visit in 2-4 weeks  Follow up on 11/20 at 3:30pm Pt aware  Come into the lab for a TB Quant Gold ( Orders are in for pt to have and we will have it drawn the day of his appointment)

## 2018-11-03 NOTE — Progress Notes (Signed)
Scheduled  In office ICD 11/03/18   Single (S)/Dual (D)/BV (M) M Presenting ASBP Pacer dependant: No. Underlying NSR @ 59/min. Longevity 6 Years/Voltage. Charge time 3.7Sec. AP 11%, BP 100%. AMS Episodes 1 08/22/17 for 20 Sec.  AT/AFL  burden <1%. Longest NA. [place]. HVR 1 NSVT. Longest 09/01/2018, < 1 Sec 7 beats.  Lead measurements: Stable Histogram: Low (L)/normal (N)/high (H)  Normal Patient activity 5 hours. Thoracic impedance: Stable  Observations: Normal device function.  Changes: None  No diagnosis found.

## 2018-11-04 ENCOUNTER — Telehealth: Payer: Self-pay

## 2018-11-04 NOTE — Telephone Encounter (Signed)
Last seen 10-06-18. OK to refill? Of so, please indicate # and refills. Thank you.

## 2018-11-05 ENCOUNTER — Telehealth: Payer: Self-pay

## 2018-11-05 ENCOUNTER — Other Ambulatory Visit: Payer: Self-pay

## 2018-11-05 DIAGNOSIS — K50019 Crohn's disease of small intestine with unspecified complications: Secondary | ICD-10-CM

## 2018-11-05 MED ORDER — DICYCLOMINE HCL 10 MG PO CAPS: 10.0000 mg | ORAL_CAPSULE | Freq: Three times a day (TID) | ORAL | 3 refills | Status: DC

## 2018-11-05 NOTE — Telephone Encounter (Signed)
Script refilled

## 2018-11-05 NOTE — Telephone Encounter (Signed)
-----   Message from Mauri Pole, MD sent at 11/03/2018  1:10 PM EDT ----- Regarding: RE: Capsule [name],  Based on findings of capsule, mucosal changes are suggestive of Crohn's disease.  Please bring him in for office visit in 2 to 4 weeks either with me or APP.  Will need to discuss starting Biologics. Please send Rx for prednisone 40 mg daily, continue for 2 weeks and subsequently decrease by 5 mg every week. He will need to come in for blood work to check TB QuantiFERON gold, hep A IgG total and hepatitis B surface antigen and surface antibody.  He is scheduled for EGD at [facility] Endo on November 30, please add colonoscopy for same day if possible Thank you VN ----- Message ----- From: Alfredia Ferguson, PA-C Sent: 11/02/2018   3:05 PM EDT To: Mauri Pole, MD Subject: Capsule                                        Capsule ready for review on this pt - has impressive changes in distal small bowel , lots of ulcers and inflammation - looks c/w Crohns  I printed reports - in your folder to sign - if you want to look at images yourself let me know- thanks

## 2018-11-05 NOTE — Telephone Encounter (Addendum)
Called patient. No answer. Left brief message to call 11/09/18 and ask for [facility]. Tried to call spouse as listed on the designated party release. No answer. Did not leave a message.  After further review of the chart, noted that he has been contacted by CMA on 11/03/18 and has his medication and follow up appointment.   The colonoscopy needs to be added. Additional labs are added.

## 2018-11-05 NOTE — Telephone Encounter (Signed)
Yes ok to refill- enough refills for a year. Thanks-JLL

## 2018-11-05 NOTE — Telephone Encounter (Deleted)
Labs entered. Note to Dr Silverio Decamp to check the correct labs are entered. He has an appointment with Dr Silverio Decamp on.

## 2018-11-09 ENCOUNTER — Encounter: Payer: Self-pay | Admitting: Gastroenterology

## 2018-11-26 ENCOUNTER — Other Ambulatory Visit: Payer: 59

## 2018-11-26 ENCOUNTER — Ambulatory Visit: Payer: 59 | Admitting: Gastroenterology

## 2018-11-26 ENCOUNTER — Encounter: Payer: Self-pay | Admitting: Gastroenterology

## 2018-11-26 VITALS — BP 120/70 | HR 73 | Temp 98.6°F | Ht 70.0 in | Wt 188.0 lb

## 2018-11-26 DIAGNOSIS — R1084 Generalized abdominal pain: Secondary | ICD-10-CM

## 2018-11-26 DIAGNOSIS — R1310 Dysphagia, unspecified: Secondary | ICD-10-CM | POA: Diagnosis not present

## 2018-11-26 DIAGNOSIS — K219 Gastro-esophageal reflux disease without esophagitis: Secondary | ICD-10-CM | POA: Diagnosis not present

## 2018-11-26 DIAGNOSIS — K50019 Crohn's disease of small intestine with unspecified complications: Secondary | ICD-10-CM | POA: Diagnosis not present

## 2018-11-26 DIAGNOSIS — K50919 Crohn's disease, unspecified, with unspecified complications: Secondary | ICD-10-CM

## 2018-11-26 MED ORDER — PREDNISONE 20 MG PO TABS: 20.0000 mg | ORAL_TABLET | Freq: Every day | ORAL | 1 refills | Status: DC

## 2018-11-26 NOTE — Progress Notes (Signed)
Miguel Pena    [id number]    09/06/1965  Primary Care Physician:Elkins, Curt Jews, MD  Referring Physician: [name], MD [address] [place],  [place] [address]   Chief complaint: Dysphagia, GERD, abdominal pain  HPI: 53 year old male with history of CHF, CAD on Plavix, last seen by Ellouise Newer in September 2020 here for follow-up visit Capsule endoscopy October 22, 2018: Multiple aphthous ulcers in distal small bowel associated with edema and mucosal friability concerning for Crohn's disease  Was started on prednisone taper based on capsule endoscopy findings.  Since he started taking prednisone his symptoms have significantly improved.  He no longer has abdominal pain, cramping, bloating, dyspepsia or dysphagia. He is currently taking prednisone 30 mg daily with plan to decrease by 5 mg every week.  Relevant GI history:   03/10/2017 EGD : LA grade C reflux and candidiasis esophagitis, benign-appearing esophageal stenosis, gastritis with hemorrhage, duodenal erosions without bleeding otherwise normal.  Started on Nexium 40 twice daily.     03/10/2017  Colonoscopy : moderate diverticulosis in the sigmoid and descending colon, narrowing of the colon in association with diverticular opening, peridiverticular erythema, 1 5 mm polyp in the sigmoid colon and nonbleeding internal hemorrhoids.   Pathology showed chronic gastritis and reflux esophagitis negative for H. pylori or dysplasia.  Polyp removed was hyperplastic.  Recall colonoscopy recommended in 10 years.     CT abdomen pelvis with contrast 09/29/2018 with inflammatory changes and thickening of the distal and terminal ileum which may represent infectious enteritis or an inflammatory bowel disease.  No bowel obstruction, normal appendix.  Sigmoid diverticulosis and cholelithiasis.  Outpatient Encounter Medications as of 11/26/2018  Medication Sig  . aspirin 81 MG chewable tablet Chew 1 tablet  (81 mg total) by mouth daily.  . bisoprolol (ZEBETA) 5 MG tablet TAKE 1 TABLET BY MOUTH EVERY DAY  . dicyclomine (BENTYL) 10 MG capsule Take 1 capsule (10 mg total) by mouth 3 (three) times daily. Take at noon, 4pm & evening  . ENTRESTO 97-103 MG TAKE 1 TABLET BY MOUTH TWICE DAILY  . eszopiclone (LUNESTA) 1 MG TABS tablet Take 1 tablet by mouth as needed.  . furosemide (LASIX) 40 MG tablet Take 1 tablet (40 mg total) by mouth as needed for edema. (Patient taking differently: Take 40 mg by mouth as needed. )  . HYDROcodone-acetaminophen (NORCO) 10-325 MG tablet Take 1 tablet by mouth every 4 (four) hours as needed for moderate pain.   . nicotine polacrilex (NICORETTE) 2 MG gum Take 2 mg by mouth as needed for smoking cessation.  . potassium chloride SA (K-DUR) 20 MEQ tablet TAKE 1 TABLET BY MOUTH DAILY  . predniSONE (DELTASONE) 20 MG tablet Take 2 tablets (40 mg total) by mouth daily with breakfast. Patient is to take 40 mg daily for 2 weeks then taper by 5 mg weekly  . rosuvastatin (CRESTOR) 20 MG tablet TAKE 1 TABLET BY MOUTH DAILY  . spironolactone (ALDACTONE) 25 MG tablet TAKE 1 TABLET BY MOUTH EVERY DAY  . omeprazole (PRILOSEC) 40 MG capsule Take 1 capsule (40 mg total) by mouth 2 (two) times daily.   No facility-administered encounter medications on file as of 11/26/2018.     Allergies as of 11/26/2018  . (No Known Allergies)    Past Medical History:  Diagnosis Date  . Bursitis of right shoulder   . CHF (congestive heart failure) ([place])    [name] 10/24/2016  .  Chronic combined systolic and diastolic heart failure ([place]) 03/16/2017  . Dilated cardiomyopathy ([place])    [name] 10/24/2016  . Dysrhythmia 1995   "VT" per patient- states had neg stress test and no problems since  . Encounter for adjustment or management of automatic implantable cardioverter-defibrillator 06/17/2018  . Encounter for assessment of automatic implantable cardioverter-defibrillator (AICD) 06/17/2018  . Heart  murmur    "when I was young"  . History of kidney stones   . Hypercholesterolemia   . ICD: Medtronic MRI Quad CRTD (Bi-V ICD) implantation 03/16/2017 [name] 03/16/2017   Scheduled Remote ICD check   8.7.20: No VHR episodes. No mode switches. Normal health trends. Trans-thoracic impedance trends and the OptiVol Fluid Index do no present significant abnormalities. Battery longevity is 6.5-8.3 years. RA pacing is 8.3 %, RV pacing is 99.9 %, and LV pacing is 99.9 %.  Clinic: 05/18/17  . NICM (nonischemic cardiomyopathy) ([place]) 03/16/2017    Past Surgical History:  Procedure Laterality Date  . BIV ICD INSERTION CRT-D N/A 03/16/2017   Procedure: BIV ICD INSERTION CRT-D;  Surgeon: Deboraha Sprang, MD;  Location: [place] [facility];  Service: Cardiovascular;  Laterality: N/A;  . COLONOSCOPY WITH PROPOFOL N/A 03/10/2017   Procedure: COLONOSCOPY WITH PROPOFOL;  Surgeon: Mauri Pole, MD;  Location: [facility];  Service: Endoscopy;  Laterality: N/A;  . CORONARY ANGIOPLASTY WITH STENT PLACEMENT  10/28/2016  . CORONARY STENT INTERVENTION N/A 10/28/2016   Procedure: CORONARY STENT INTERVENTION;  Surgeon: [name], MD;  Location: [place] [facility];  Service: Cardiovascular;  Laterality: N/A;  . ESOPHAGOGASTRODUODENOSCOPY (EGD) WITH PROPOFOL N/A 03/10/2017   Procedure: ESOPHAGOGASTRODUODENOSCOPY (EGD) WITH PROPOFOL;  Surgeon: Mauri Pole, MD;  Location: [facility];  Service: Endoscopy;  Laterality: N/A;  . INGUINAL HERNIA REPAIR Right 02/16/2013   Procedure: RIGHT INGUINAL HERNIA REPAIR ;  Surgeon: [name], MD;  Location: [facility];  Service: General;  Laterality: Right;  With MESH  . INGUINAL HERNIA REPAIR Right 02/16/2013  . INTRAVASCULAR PRESSURE WIRE/FFR STUDY N/A 10/28/2016   Procedure: INTRAVASCULAR PRESSURE WIRE/FFR STUDY;  Surgeon: [name], MD;  Location: [place] [facility];  Service: Cardiovascular;  Laterality: N/A;  . RIGHT/LEFT HEART CATH AND CORONARY  ANGIOGRAPHY N/A 10/28/2016   Procedure: RIGHT/LEFT HEART CATH AND CORONARY ANGIOGRAPHY;  Surgeon: [name], MD;  Location: [place] [facility];  Service: Cardiovascular;  Laterality: N/A;  . ULTRASOUND GUIDANCE FOR VASCULAR ACCESS  10/28/2016   Procedure: Ultrasound Guidance For Vascular Access;  Surgeon: [name], MD;  Location: [place] [facility];  Service: Cardiovascular;;    Family History  Problem Relation Age of Onset  . Heart attack Father   . Colon polyps Father   . Colon polyps Brother     Social History   Socioeconomic History  . Marital status: Married    Spouse name: Not on file  . Number of children: 2  . Years of education: Not on file  . Highest education level: Not on file  Occupational History  . Occupation: Emergency planning/management officer    Comment: [employer]  Social Needs  . Financial resource strain: Not on file  . Food insecurity    Worry: Not on file    Inability: Not on file  . Transportation needs    Medical: Not on file    Non-medical: Not on file  Tobacco Use  . Smoking status: Former Smoker    Packs/day: 1.00    Years: 12.00  Pack years: 12.00    Types: Cigarettes, Cigars    Quit date: 02/16/2011    Years since quitting: 7.7  . Smokeless tobacco: Former Systems developer    Types: Snuff  . Tobacco comment: 10/28/2016 'chewed for a couple years after I quit smoking"  Substance and Sexual Activity  . Alcohol use: No    Comment: none since 9/11  . Drug use: No  . Sexual activity: Not Currently  Lifestyle  . Physical activity    Days per week: Not on file    Minutes per session: Not on file  . Stress: Not on file  Relationships  . Social Herbalist on phone: Not on file    Gets together: Not on file    Attends religious service: Not on file    Active member of club or organization: Not on file    Attends meetings of clubs or organizations: Not on file    Relationship status: Not on file  . Intimate partner violence     Fear of current or ex partner: Not on file    Emotionally abused: Not on file    Physically abused: Not on file    Forced sexual activity: Not on file  Other Topics Concern  . Not on file  Social History Narrative  . Not on file      Review of systems: Review of Systems  Constitutional: Negative for fever and chills.  HENT: Negative.   Eyes: Negative for blurred vision.  Respiratory: Negative for cough, shortness of breath and wheezing.   Cardiovascular: Negative for chest pain and palpitations.  Gastrointestinal: as per HPI Genitourinary: Negative for dysuria, urgency, frequency and hematuria.  Musculoskeletal: Negative for myalgias, back pain and joint pain.  Skin: Negative for itching and rash.  Neurological: Negative for dizziness, tremors, focal weakness, seizures and loss of consciousness.  Endo/Heme/Allergies: Positive for seasonal allergies.  Psychiatric/Behavioral: Negative for depression, suicidal ideas and hallucinations.  All other systems reviewed and are negative.   Physical Exam: Vitals:   11/26/18 1528  BP: 120/70  Pulse: 73  Temp: 98.6 F (37 C)   Body mass index is 26.98 kg/m. Gen:      No acute distress HEENT:  EOMI, sclera anicteric Neck:     No masses; no thyromegaly Lungs:    Clear to auscultation bilaterally; normal respiratory effort CV:         Regular rate and rhythm; no murmurs Abd:      + bowel sounds; soft, non-tender; no palpable masses, no distension Ext:    No edema; adequate peripheral perfusion Skin:      Warm and dry; no rash Neuro: alert and oriented x 3 Psych: normal mood and affect  Data Reviewed:  Reviewed labs, radiology imaging, old records and pertinent past GI work up   Assessment and Plan/Recommendations:  53 year old male with history of CAD s/p drug-eluting stent on Plavix, CHF [EF 25%] with complaints of dysphagia, dyspepsia and abdominal pain. CT abdomen and pelvis and subsequent capsule endoscopy with ileal  ulceration concerning for Crohn's disease. Symptoms have significantly improved with prednisone taper. Advised patient to continue decreasing prednisone by 5 mg every week until he reaches prednisone dose of 20 mg daily.    EGD and colonoscopy with biopsies to confirm diagnosis of Crohn's disease. Discussed chronic maintenance therapy with immunosuppression and Biologics.  Patient is reluctant to start any additional therapy at this point. Will rediscuss after EGD and colonoscopy once the biopsies confirm the  diagnosis of Crohn's  Check TB QuantiFERON Discussed immunizations, patient declined flu and pneumonia vaccination  The risks and benefits as well as alternatives of endoscopic procedure(s) have been discussed and reviewed. All questions answered. The patient agrees to proceed.   Damaris Hippo , MD    CC: [name], *

## 2018-11-26 NOTE — H&P (View-Only) (Signed)
Miguel Pena    [id number]    11/23/1965  Primary Care Physician:Elkins, Curt Jews, MD  Referring Physician: [name], MD [address] [place],  [place] [address]   Chief complaint: Dysphagia, GERD, abdominal pain  HPI: 53 year old male with history of CHF, CAD on Plavix, last seen by Ellouise Newer in September 2020 here for follow-up visit Capsule endoscopy October 22, 2018: Multiple aphthous ulcers in distal small bowel associated with edema and mucosal friability concerning for Crohn's disease  Was started on prednisone taper based on capsule endoscopy findings.  Since he started taking prednisone his symptoms have significantly improved.  He no longer has abdominal pain, cramping, bloating, dyspepsia or dysphagia. He is currently taking prednisone 30 mg daily with plan to decrease by 5 mg every week.  Relevant GI history:   03/10/2017 EGD : LA grade C reflux and candidiasis esophagitis, benign-appearing esophageal stenosis, gastritis with hemorrhage, duodenal erosions without bleeding otherwise normal.  Started on Nexium 40 twice daily.     03/10/2017  Colonoscopy : moderate diverticulosis in the sigmoid and descending colon, narrowing of the colon in association with diverticular opening, peridiverticular erythema, 1 5 mm polyp in the sigmoid colon and nonbleeding internal hemorrhoids.   Pathology showed chronic gastritis and reflux esophagitis negative for H. pylori or dysplasia.  Polyp removed was hyperplastic.  Recall colonoscopy recommended in 10 years.     CT abdomen pelvis with contrast 09/29/2018 with inflammatory changes and thickening of the distal and terminal ileum which may represent infectious enteritis or an inflammatory bowel disease.  No bowel obstruction, normal appendix.  Sigmoid diverticulosis and cholelithiasis.  Outpatient Encounter Medications as of 11/26/2018  Medication Sig  . aspirin 81 MG chewable tablet Chew 1 tablet  (81 mg total) by mouth daily.  . bisoprolol (ZEBETA) 5 MG tablet TAKE 1 TABLET BY MOUTH EVERY DAY  . dicyclomine (BENTYL) 10 MG capsule Take 1 capsule (10 mg total) by mouth 3 (three) times daily. Take at noon, 4pm & evening  . ENTRESTO 97-103 MG TAKE 1 TABLET BY MOUTH TWICE DAILY  . eszopiclone (LUNESTA) 1 MG TABS tablet Take 1 tablet by mouth as needed.  . furosemide (LASIX) 40 MG tablet Take 1 tablet (40 mg total) by mouth as needed for edema. (Patient taking differently: Take 40 mg by mouth as needed. )  . HYDROcodone-acetaminophen (NORCO) 10-325 MG tablet Take 1 tablet by mouth every 4 (four) hours as needed for moderate pain.   . nicotine polacrilex (NICORETTE) 2 MG gum Take 2 mg by mouth as needed for smoking cessation.  . potassium chloride SA (K-DUR) 20 MEQ tablet TAKE 1 TABLET BY MOUTH DAILY  . predniSONE (DELTASONE) 20 MG tablet Take 2 tablets (40 mg total) by mouth daily with breakfast. Patient is to take 40 mg daily for 2 weeks then taper by 5 mg weekly  . rosuvastatin (CRESTOR) 20 MG tablet TAKE 1 TABLET BY MOUTH DAILY  . spironolactone (ALDACTONE) 25 MG tablet TAKE 1 TABLET BY MOUTH EVERY DAY  . omeprazole (PRILOSEC) 40 MG capsule Take 1 capsule (40 mg total) by mouth 2 (two) times daily.   No facility-administered encounter medications on file as of 11/26/2018.     Allergies as of 11/26/2018  . (No Known Allergies)    Past Medical History:  Diagnosis Date  . Bursitis of right shoulder   . CHF (congestive heart failure) ([place])    [name] 10/24/2016  .  Chronic combined systolic and diastolic heart failure ([place]) 03/16/2017  . Dilated cardiomyopathy ([place])    [name] 10/24/2016  . Dysrhythmia 1995   "VT" per patient- states had neg stress test and no problems since  . Encounter for adjustment or management of automatic implantable cardioverter-defibrillator 06/17/2018  . Encounter for assessment of automatic implantable cardioverter-defibrillator (AICD) 06/17/2018  . Heart  murmur    "when I was young"  . History of kidney stones   . Hypercholesterolemia   . ICD: Medtronic MRI Quad CRTD (Bi-V ICD) implantation 03/16/2017 [name] 03/16/2017   Scheduled Remote ICD check   8.7.20: No VHR episodes. No mode switches. Normal health trends. Trans-thoracic impedance trends and the OptiVol Fluid Index do no present significant abnormalities. Battery longevity is 6.5-8.3 years. RA pacing is 8.3 %, RV pacing is 99.9 %, and LV pacing is 99.9 %.  Clinic: 05/18/17  . NICM (nonischemic cardiomyopathy) ([place]) 03/16/2017    Past Surgical History:  Procedure Laterality Date  . BIV ICD INSERTION CRT-D N/A 03/16/2017   Procedure: BIV ICD INSERTION CRT-D;  Surgeon: Deboraha Sprang, MD;  Location: [facility];  Service: Cardiovascular;  Laterality: N/A;  . COLONOSCOPY WITH PROPOFOL N/A 03/10/2017   Procedure: COLONOSCOPY WITH PROPOFOL;  Surgeon: Mauri Pole, MD;  Location: [facility];  Service: Endoscopy;  Laterality: N/A;  . CORONARY ANGIOPLASTY WITH STENT PLACEMENT  10/28/2016  . CORONARY STENT INTERVENTION N/A 10/28/2016   Procedure: CORONARY STENT INTERVENTION;  Surgeon: [name], MD;  Location: [facility];  Service: Cardiovascular;  Laterality: N/A;  . ESOPHAGOGASTRODUODENOSCOPY (EGD) WITH PROPOFOL N/A 03/10/2017   Procedure: ESOPHAGOGASTRODUODENOSCOPY (EGD) WITH PROPOFOL;  Surgeon: Mauri Pole, MD;  Location: [facility];  Service: Endoscopy;  Laterality: N/A;  . INGUINAL HERNIA REPAIR Right 02/16/2013   Procedure: RIGHT INGUINAL HERNIA REPAIR ;  Surgeon: [name], MD;  Location: [facility];  Service: General;  Laterality: Right;  With MESH  . INGUINAL HERNIA REPAIR Right 02/16/2013  . INTRAVASCULAR PRESSURE WIRE/FFR STUDY N/A 10/28/2016   Procedure: INTRAVASCULAR PRESSURE WIRE/FFR STUDY;  Surgeon: [name], MD;  Location: [facility];  Service: Cardiovascular;  Laterality: N/A;  . RIGHT/LEFT HEART CATH AND CORONARY  ANGIOGRAPHY N/A 10/28/2016   Procedure: RIGHT/LEFT HEART CATH AND CORONARY ANGIOGRAPHY;  Surgeon: [name], MD;  Location: [facility];  Service: Cardiovascular;  Laterality: N/A;  . ULTRASOUND GUIDANCE FOR VASCULAR ACCESS  10/28/2016   Procedure: Ultrasound Guidance For Vascular Access;  Surgeon: [name], MD;  Location: [facility];  Service: Cardiovascular;;    Family History  Problem Relation Age of Onset  . Heart attack Father   . Colon polyps Father   . Colon polyps Brother     Social History   Socioeconomic History  . Marital status: Married    Spouse name: Not on file  . Number of children: 2  . Years of education: Not on file  . Highest education level: Not on file  Occupational History  . Occupation: Emergency planning/management officer    Comment: [employer]  Social Needs  . Financial resource strain: Not on file  . Food insecurity    Worry: Not on file    Inability: Not on file  . Transportation needs    Medical: Not on file    Non-medical: Not on file  Tobacco Use  . Smoking status: Former Smoker    Packs/day: 1.00    Years: 12.00  Pack years: 12.00    Types: Cigarettes, Cigars    Quit date: 02/16/2011    Years since quitting: 7.7  . Smokeless tobacco: Former Systems developer    Types: Snuff  . Tobacco comment: 10/28/2016 'chewed for a couple years after I quit smoking"  Substance and Sexual Activity  . Alcohol use: No    Comment: none since 9/11  . Drug use: No  . Sexual activity: Not Currently  Lifestyle  . Physical activity    Days per week: Not on file    Minutes per session: Not on file  . Stress: Not on file  Relationships  . Social Herbalist on phone: Not on file    Gets together: Not on file    Attends religious service: Not on file    Active member of club or organization: Not on file    Attends meetings of clubs or organizations: Not on file    Relationship status: Not on file  . Intimate partner violence     Fear of current or ex partner: Not on file    Emotionally abused: Not on file    Physically abused: Not on file    Forced sexual activity: Not on file  Other Topics Concern  . Not on file  Social History Narrative  . Not on file      Review of systems: Review of Systems  Constitutional: Negative for fever and chills.  HENT: Negative.   Eyes: Negative for blurred vision.  Respiratory: Negative for cough, shortness of breath and wheezing.   Cardiovascular: Negative for chest pain and palpitations.  Gastrointestinal: as per HPI Genitourinary: Negative for dysuria, urgency, frequency and hematuria.  Musculoskeletal: Negative for myalgias, back pain and joint pain.  Skin: Negative for itching and rash.  Neurological: Negative for dizziness, tremors, focal weakness, seizures and loss of consciousness.  Endo/Heme/Allergies: Positive for seasonal allergies.  Psychiatric/Behavioral: Negative for depression, suicidal ideas and hallucinations.  All other systems reviewed and are negative.   Physical Exam: Vitals:   11/26/18 1528  BP: 120/70  Pulse: 73  Temp: 98.6 F (37 C)   Body mass index is 26.98 kg/m. Gen:      No acute distress HEENT:  EOMI, sclera anicteric Neck:     No masses; no thyromegaly Lungs:    Clear to auscultation bilaterally; normal respiratory effort CV:         Regular rate and rhythm; no murmurs Abd:      + bowel sounds; soft, non-tender; no palpable masses, no distension Ext:    No edema; adequate peripheral perfusion Skin:      Warm and dry; no rash Neuro: alert and oriented x 3 Psych: normal mood and affect  Data Reviewed:  Reviewed labs, radiology imaging, old records and pertinent past GI work up   Assessment and Plan/Recommendations:  53 year old male with history of CAD s/p drug-eluting stent on Plavix, CHF [EF 25%] with complaints of dysphagia, dyspepsia and abdominal pain. CT abdomen and pelvis and subsequent capsule endoscopy with ileal  ulceration concerning for Crohn's disease. Symptoms have significantly improved with prednisone taper. Advised patient to continue decreasing prednisone by 5 mg every week until he reaches prednisone dose of 20 mg daily.    EGD and colonoscopy with biopsies to confirm diagnosis of Crohn's disease. Discussed chronic maintenance therapy with immunosuppression and Biologics.  Patient is reluctant to start any additional therapy at this point. Will rediscuss after EGD and colonoscopy once the biopsies confirm the  diagnosis of Crohn's  Check TB QuantiFERON Discussed immunizations, patient declined flu and pneumonia vaccination  The risks and benefits as well as alternatives of endoscopic procedure(s) have been discussed and reviewed. All questions answered. The patient agrees to proceed.   Damaris Hippo , MD    CC: [name], *

## 2018-11-26 NOTE — Patient Instructions (Addendum)
You have been scheduled for a Endoscopy and Colonoscopy at [facility] on 12/06/2018 at 7:30am, Separate instrcutions have been given  Due to recent COVID-19 restrictions implemented by our local and state authorities and in an effort to keep both patients and staff as safe as possible, our hospital system now requires COVID-19 testing prior to any scheduled hospital procedure. Please go to our [facility] location drive thru testing site ([address] [place], [place] [address]) on 12/03/2018 at  12:25pm. There will be multiple testing areas, the first checkpoint being for pre-procedure/surgery testing. Get into the right (yellow) lane that leads to the PAT testing team. You will not be billed at the time of testing but may receive a bill later depending on your insurance. The approximate cost of the test is $100. You must agree to quarantine from the time of your testing until the procedure date on 12/06/2018 . This should include staying at home with ONLY the people you live with. Avoid take-out, grocery store shopping or leaving the house for any non-emergent reason. Failure to have your COVID-19 test done on the date and time you have been scheduled will result in cancellation of procedure. Please call our office at [phone] if you have any questions.   Go to the basement today for labs  Continue to taper your prednisone and when you get to 67m stay at that dose until your next appointment with Dr. NSilverio Decamp  I appreciate the  opportunity to care for you  Thank You   KHarl Bowie, MD

## 2018-11-28 LAB — QUANTIFERON-TB GOLD PLUS
Mitogen-NIL: 9.17 IU/mL
NIL: 0.02 IU/mL
QuantiFERON-TB Gold Plus: NEGATIVE
TB1-NIL: 0.01 IU/mL
TB2-NIL: 0.01 IU/mL

## 2018-11-29 ENCOUNTER — Encounter (HOSPITAL_COMMUNITY): Payer: Self-pay | Admitting: *Deleted

## 2018-12-01 ENCOUNTER — Other Ambulatory Visit: Payer: Self-pay

## 2018-12-01 ENCOUNTER — Other Ambulatory Visit (HOSPITAL_COMMUNITY): Payer: 59

## 2018-12-01 DIAGNOSIS — K50919 Crohn's disease, unspecified, with unspecified complications: Secondary | ICD-10-CM

## 2018-12-02 ENCOUNTER — Other Ambulatory Visit: Payer: Self-pay | Admitting: Cardiology

## 2018-12-03 ENCOUNTER — Other Ambulatory Visit (HOSPITAL_COMMUNITY)
Admission: RE | Admit: 2018-12-03 | Discharge: 2018-12-03 | Disposition: A | Discharge status: Home / Self Care | Payer: 59 | Source: Ambulatory Visit | Attending: Gastroenterology | Admitting: Gastroenterology

## 2018-12-03 DIAGNOSIS — Z01812 Encounter for preprocedural laboratory examination: Secondary | ICD-10-CM | POA: Insufficient documentation

## 2018-12-03 DIAGNOSIS — Z20828 Contact with and (suspected) exposure to other viral communicable diseases: Secondary | ICD-10-CM | POA: Insufficient documentation

## 2018-12-03 LAB — SARS CORONAVIRUS 2 (TAT 6-24 HRS): SARS Coronavirus 2: NEGATIVE

## 2018-12-05 NOTE — Anesthesia Preprocedure Evaluation (Addendum)
Anesthesia Evaluation  Patient identified by MRN, date of birth, ID band Patient awake    Reviewed: Allergy & Precautions, NPO status , Patient's Chart, lab work & pertinent test results  History of Anesthesia Complications Negative for: history of anesthetic complications  Airway Mallampati: II  TM Distance: >3 FB Neck ROM: Full    Dental  (+) Dental Advisory Given, Chipped,    Pulmonary former smoker,    Pulmonary exam normal        Cardiovascular (-) angina+ CAD, + Cardiac Stents and +CHF  Normal cardiovascular exam+ dysrhythmias Ventricular Tachycardia + Cardiac Defibrillator      Neuro/Psych negative neurological ROS  negative psych ROS   GI/Hepatic Neg liver ROS, GERD  Medicated and Controlled,  Endo/Other  negative endocrine ROS  Renal/GU negative Renal ROS     Musculoskeletal negative musculoskeletal ROS (+)   Abdominal   Peds  Hematology negative hematology ROS (+)   Anesthesia Other Findings Covid negative 11/27  Reproductive/Obstetrics                            Anesthesia Physical Anesthesia Plan  ASA: IV  Anesthesia Plan: MAC   Post-op Pain Management:    Induction: Intravenous  PONV Risk Score and Plan: 1 and Propofol infusion and Treatment may vary due to age or medical condition  Airway Management Planned: Nasal Cannula and Natural Airway  Additional Equipment: None  Intra-op Plan:   Post-operative Plan:   Informed Consent: I have reviewed the patients History and Physical, chart, labs and discussed the procedure including the risks, benefits and alternatives for the proposed anesthesia with the patient or authorized representative who has indicated his/her understanding and acceptance.       Plan Discussed with: CRNA and Anesthesiologist  Anesthesia Plan Comments:        Anesthesia Quick Evaluation

## 2018-12-06 ENCOUNTER — Ambulatory Visit: Payer: 59 | Admitting: Cardiology

## 2018-12-06 ENCOUNTER — Ambulatory Visit (HOSPITAL_COMMUNITY): Payer: 59 | Admitting: Anesthesiology

## 2018-12-06 ENCOUNTER — Encounter (HOSPITAL_COMMUNITY)
Admission: RE | Disposition: A | Discharge status: Home / Self Care | Payer: Self-pay | Source: Home / Self Care | Attending: Gastroenterology

## 2018-12-06 ENCOUNTER — Other Ambulatory Visit: Payer: Self-pay

## 2018-12-06 ENCOUNTER — Ambulatory Visit (HOSPITAL_COMMUNITY)
Admission: RE | Admit: 2018-12-06 | Discharge: 2018-12-06 | Disposition: A | Discharge status: Home / Self Care | Payer: 59 | Attending: Gastroenterology | Admitting: Gastroenterology

## 2018-12-06 ENCOUNTER — Encounter (HOSPITAL_COMMUNITY): Payer: Self-pay | Admitting: Certified Registered"

## 2018-12-06 DIAGNOSIS — R109 Unspecified abdominal pain: Secondary | ICD-10-CM

## 2018-12-06 DIAGNOSIS — R1310 Dysphagia, unspecified: Secondary | ICD-10-CM | POA: Insufficient documentation

## 2018-12-06 DIAGNOSIS — K50919 Crohn's disease, unspecified, with unspecified complications: Secondary | ICD-10-CM

## 2018-12-06 DIAGNOSIS — Z7982 Long term (current) use of aspirin: Secondary | ICD-10-CM | POA: Insufficient documentation

## 2018-12-06 DIAGNOSIS — K5730 Diverticulosis of large intestine without perforation or abscess without bleeding: Secondary | ICD-10-CM | POA: Insufficient documentation

## 2018-12-06 DIAGNOSIS — I2510 Atherosclerotic heart disease of native coronary artery without angina pectoris: Secondary | ICD-10-CM | POA: Diagnosis not present

## 2018-12-06 DIAGNOSIS — K2971 Gastritis, unspecified, with bleeding: Secondary | ICD-10-CM | POA: Diagnosis not present

## 2018-12-06 DIAGNOSIS — Z955 Presence of coronary angioplasty implant and graft: Secondary | ICD-10-CM | POA: Insufficient documentation

## 2018-12-06 DIAGNOSIS — K648 Other hemorrhoids: Secondary | ICD-10-CM | POA: Insufficient documentation

## 2018-12-06 DIAGNOSIS — I5042 Chronic combined systolic (congestive) and diastolic (congestive) heart failure: Secondary | ICD-10-CM | POA: Insufficient documentation

## 2018-12-06 DIAGNOSIS — K633 Ulcer of intestine: Secondary | ICD-10-CM | POA: Diagnosis not present

## 2018-12-06 DIAGNOSIS — K2210 Ulcer of esophagus without bleeding: Secondary | ICD-10-CM | POA: Diagnosis not present

## 2018-12-06 DIAGNOSIS — K529 Noninfective gastroenteritis and colitis, unspecified: Secondary | ICD-10-CM | POA: Insufficient documentation

## 2018-12-06 DIAGNOSIS — Z87891 Personal history of nicotine dependence: Secondary | ICD-10-CM | POA: Insufficient documentation

## 2018-12-06 DIAGNOSIS — Z7902 Long term (current) use of antithrombotics/antiplatelets: Secondary | ICD-10-CM | POA: Diagnosis not present

## 2018-12-06 DIAGNOSIS — R1084 Generalized abdominal pain: Secondary | ICD-10-CM | POA: Diagnosis not present

## 2018-12-06 DIAGNOSIS — K208 Other esophagitis without bleeding: Secondary | ICD-10-CM

## 2018-12-06 DIAGNOSIS — Z79899 Other long term (current) drug therapy: Secondary | ICD-10-CM | POA: Insufficient documentation

## 2018-12-06 DIAGNOSIS — E7800 Pure hypercholesterolemia, unspecified: Secondary | ICD-10-CM | POA: Diagnosis not present

## 2018-12-06 DIAGNOSIS — Z95810 Presence of automatic (implantable) cardiac defibrillator: Secondary | ICD-10-CM | POA: Insufficient documentation

## 2018-12-06 DIAGNOSIS — K625 Hemorrhage of anus and rectum: Secondary | ICD-10-CM

## 2018-12-06 DIAGNOSIS — K2950 Unspecified chronic gastritis without bleeding: Secondary | ICD-10-CM | POA: Diagnosis not present

## 2018-12-06 DIAGNOSIS — K523 Indeterminate colitis: Secondary | ICD-10-CM | POA: Diagnosis not present

## 2018-12-06 DIAGNOSIS — K56699 Other intestinal obstruction unspecified as to partial versus complete obstruction: Secondary | ICD-10-CM | POA: Insufficient documentation

## 2018-12-06 DIAGNOSIS — K219 Gastro-esophageal reflux disease without esophagitis: Secondary | ICD-10-CM | POA: Diagnosis not present

## 2018-12-06 HISTORY — PX: ESOPHAGOGASTRODUODENOSCOPY (EGD) WITH PROPOFOL: SHX5813

## 2018-12-06 HISTORY — PX: BIOPSY: SHX5522

## 2018-12-06 HISTORY — PX: COLONOSCOPY WITH PROPOFOL: SHX5780

## 2018-12-06 SURGERY — ESOPHAGOGASTRODUODENOSCOPY (EGD) WITH PROPOFOL
Anesthesia: Monitor Anesthesia Care

## 2018-12-06 MED ORDER — PROPOFOL 10 MG/ML IV BOLUS
INTRAVENOUS | Status: DC | PRN
  Administered 2018-12-06: 30 mg via INTRAVENOUS
  Administered 2018-12-06: 20 mg via INTRAVENOUS

## 2018-12-06 MED ORDER — LACTATED RINGERS IV SOLN
INTRAVENOUS | Status: DC
  Administered 2018-12-06: 1000 mL via INTRAVENOUS

## 2018-12-06 MED ORDER — SODIUM CHLORIDE 0.9 % IV SOLN: INTRAVENOUS | Status: DC

## 2018-12-06 MED ORDER — PROPOFOL 500 MG/50ML IV EMUL
INTRAVENOUS | Status: DC | PRN
  Administered 2018-12-06: 125 ug/kg/min via INTRAVENOUS

## 2018-12-06 MED ORDER — LIDOCAINE 2% (20 MG/ML) 5 ML SYRINGE
INTRAMUSCULAR | Status: DC | PRN
  Administered 2018-12-06: 75 mg via INTRAVENOUS

## 2018-12-06 MED ORDER — EPHEDRINE SULFATE-NACL 50-0.9 MG/10ML-% IV SOSY
PREFILLED_SYRINGE | INTRAVENOUS | Status: DC | PRN
  Administered 2018-12-06 (×2): 10 mg via INTRAVENOUS

## 2018-12-06 MED ORDER — PROPOFOL 500 MG/50ML IV EMUL
INTRAVENOUS | Status: AC
  Filled 2018-12-06: qty 50

## 2018-12-06 SURGICAL SUPPLY — 25 items

## 2018-12-06 NOTE — Interval H&P Note (Signed)
History and Physical Interval Note:  12/06/2018 7:38 AM  Miguel Pena  has presented today for surgery, with the diagnosis of Gerd, Dysphagia, Rectal Bleeding, Abd pain, Abnormal CT.  The various methods of treatment have been discussed with the patient and family. After consideration of risks, benefits and other options for treatment, the patient has consented to  Procedure(s): ESOPHAGOGASTRODUODENOSCOPY (EGD) WITH PROPOFOL (N/A) COLONOSCOPY WITH PROPOFOL (N/A) as a surgical intervention.  The patient's history has been reviewed, patient examined, no change in status, stable for surgery.  I have reviewed the patient's chart and labs.  Questions were answered to the patient's satisfaction.     Vibha Ferdig

## 2018-12-06 NOTE — Op Note (Signed)
[facility] Patient Name: Miguel Pena Procedure Date: 12/06/2018 MRN: [id number] Attending MD: Mauri Pole , MD Date of Birth: 03-05-1965 CSN: [id number] Age: 53 Admit Type: Outpatient Procedure:                Upper GI endoscopy Indications:              Dysphagia, Generalized abdominal pain Providers:                Mauri Pole, MD, Lina Sar, Technician,                            Glori Bickers, RN, [facility], CRNA Referring MD:              Medicines:                Monitored Anesthesia Care Complications:            No immediate complications. Estimated Blood Loss:     Estimated blood loss was minimal. Procedure:                Pre-Anesthesia Assessment:                           - Prior to the procedure, a History and Physical                            was performed, and patient medications and                            allergies were reviewed. The patient's tolerance of                            previous anesthesia was also reviewed. The risks                            and benefits of the procedure and the sedation                            options and risks were discussed with the patient.                            All questions were answered, and informed consent                            was obtained. Prior Anticoagulants: The patient has                            taken no previous anticoagulant or antiplatelet                            agents. ASA Grade Assessment: II - A patient with                            mild systemic disease. After reviewing the risks  and benefits, the patient was deemed in                            satisfactory condition to undergo the procedure.                           After obtaining informed consent, the endoscope was                            passed under direct vision. Throughout the                            procedure, the patient's blood pressure, pulse,  and                            oxygen saturations were monitored continuously. The                            GIF-H190 ([id number] Olympus gastroscope was                            introduced through the mouth, and advanced to the                            second part of duodenum. The upper GI endoscopy was                            accomplished without difficulty. The patient                            tolerated the procedure well. Scope In: Scope Out: Findings:      LA Grade C (one or more mucosal breaks continuous between tops of 2 or       more mucosal folds, less than 75% circumference) esophagitis with no       bleeding was found 38 to 40 cm from the incisors.      The Z-line was regular and was found 40 cm from the incisors.      Patchy moderate inflammation with hemorrhage characterized by adherent       blood, congestion (edema), erosions and erythema was found in the       gastric body, in the gastric antrum, in the prepyloric region of the       stomach and at the pylorus. Biopsies were taken with a cold forceps for       histology.      The first portion of the duodenum and second portion of the duodenum       were normal. Biopsies were taken with a cold forceps for histology. Impression:               - LA Grade C erosive esophagitis with no bleeding.                           - Z-line regular, 40 cm from the incisors.                           -  Gastritis with hemorrhage. Biopsied.                           - Normal first portion of the duodenum and second                            portion of the duodenum. Biopsied. Moderate Sedation:      Not Applicable - Patient had care per Anesthesia. Recommendation:           - Await pathology results.                           - No ibuprofen, naproxen, or other non-steroidal                            anti-inflammatory drugs.                           - See the other procedure note for documentation of                             additional recommendations. Procedure Code(s):        --- Professional ---                           [phone], Esophagogastroduodenoscopy, flexible,                            transoral; with biopsy, single or multiple Diagnosis Code(s):        --- Professional ---                           K20.80, Other esophagitis without bleeding                           K29.71, Gastritis, unspecified, with bleeding                           R13.10, Dysphagia, unspecified                           R10.84, Generalized abdominal pain CPT copyright 2019 [employer] Medical [employer]. All rights reserved. The codes documented in this report are preliminary and upon coder review may  be revised to meet current compliance requirements. Mauri Pole, MD 12/06/2018 8:32:45 AM This report has been signed electronically. Number of Addenda: 0

## 2018-12-06 NOTE — Op Note (Signed)
[facility] Patient Name: Miguel Pena Procedure Date: 12/06/2018 MRN: [id number] Attending MD: Mauri Pole , MD Date of Birth: [birth year] CSN: [id number] Age: 53 Admit Type: Outpatient Procedure:                Colonoscopy Indications:              Obtain more precise diagnosis of inflammatory bowel                            disease Providers:                Mauri Pole, MD, Lina Sar, Technician,                            Glori Bickers, RN, [facility], CRNA Referring MD:              Medicines:                Monitored Anesthesia Care Complications:            No immediate complications. Estimated Blood Loss:     Estimated blood loss was minimal. Procedure:                Pre-Anesthesia Assessment:                           - Prior to the procedure, a History and Physical                            was performed, and patient medications and                            allergies were reviewed. The patient's tolerance of                            previous anesthesia was also reviewed. The risks                            and benefits of the procedure and the sedation                            options and risks were discussed with the patient.                            All questions were answered, and informed consent                            was obtained. Prior Anticoagulants: The patient has                            taken no previous anticoagulant or antiplatelet                            agents. ASA Grade Assessment: II - A patient with  mild systemic disease. After reviewing the risks                            and benefits, the patient was deemed in                            satisfactory condition to undergo the procedure.                           After obtaining informed consent, the colonoscope                            was passed under direct vision. Throughout the   procedure, the patient's blood pressure, pulse, and                            oxygen saturations were monitored continuously. The                            PCF-H190DL ([id number] Olympus pediatric colonscope                            was introduced through the anus and advanced to the                            the terminal ileum, with identification of the                            appendiceal orifice and IC valve. The colonoscopy                            was performed without difficulty. The patient                            tolerated the procedure well. The quality of the                            bowel preparation was excellent. The terminal                            ileum, ileocecal valve, appendiceal orifice, and                            rectum were photographed. Scope In: 7:57:47 AM Scope Out: 8:22:08 AM Scope Withdrawal Time: 0 hours 18 minutes 0 seconds  Total Procedure Duration: 0 hours 24 minutes 21 seconds  Findings:      The perianal and digital rectal examinations were normal.      Scattered small-mouthed diverticula were found in the sigmoid colon,       descending colon, transverse colon and ascending colon.      Non-bleeding internal hemorrhoids were found during retroflexion. The       hemorrhoids were medium-sized.      The entire examined colon appeared normal.      The terminal ileum contained a few ulcers. No bleeding was present.  Biopsies were taken with a cold forceps for histology.      The distal ileum contained a benign-appearing, intrinsic moderate       stenosis measuring 6 mm (inner diameter) that was non-traversed. Impression:               - Diverticulosis in the sigmoid colon, in the                            descending colon, in the transverse colon and in                            the ascending colon.                           - Non-bleeding internal hemorrhoids.                           - The entire examined colon is normal.                            - A few ulcers in the terminal ileum. Biopsied.                           - Stricture in the distal ileum. Moderate Sedation:      Not Applicable - Patient had care per Anesthesia. Recommendation:           - Patient has a contact number available for                            emergencies. The signs and symptoms of potential                            delayed complications were discussed with the                            patient. Return to normal activities tomorrow.                            Written discharge instructions were provided to the                            patient.                           - Low residue diet.                           - Continue present medications. Continue Prednisone                           - No ibuprofen, naproxen, or other non-steroidal                            anti-inflammatory drugs.                           -  Await pathology results.                           - Repeat colonoscopy date to be determined after                            pending pathology results are reviewed for                            surveillance based on pathology results.                           - Return to GI clinic at the next available                            appointment in 2 weeks with Dr Silverio Decamp or APP to                            discuss starting biologics (Humira). Procedure Code(s):        --- Professional ---                           [phone], Colonoscopy, flexible; with biopsy, single                            or multiple Diagnosis Code(s):        --- Professional ---                           K64.8, Other hemorrhoids                           K63.3, Ulcer of intestine                           K56.699, Other intestinal obstruction unspecified                            as to partial versus complete obstruction                           K52.3, Indeterminate colitis                           K57.30, Diverticulosis of large intestine  without                            perforation or abscess without bleeding CPT copyright 2019 American Medical Association. All rights reserved. The codes documented in this report are preliminary and upon coder review may  be revised to meet current compliance requirements. Mauri Pole, MD 12/06/2018 8:38:09 AM This report has been signed electronically. Number of Addenda: 0

## 2018-12-06 NOTE — Transfer of Care (Signed)
Immediate Anesthesia Transfer of Care Note  Patient: DECARLO RIVET  Procedure(s) Performed: ESOPHAGOGASTRODUODENOSCOPY (EGD) WITH PROPOFOL (N/A ) COLONOSCOPY WITH PROPOFOL (N/A ) BIOPSY  Patient Location: PACU  Anesthesia Type:MAC  Level of Consciousness: awake, alert  and oriented  Airway & Oxygen Therapy: Patient Spontanous Breathing and Patient connected to nasal cannula oxygen  Post-op Assessment: Report given to RN and Post -op Vital signs reviewed and stable  Post vital signs: Reviewed and stable  Last Vitals:  Vitals Value Taken Time  BP    Temp    Pulse    Resp    SpO2      Last Pain:  Vitals:   12/06/18 0710  TempSrc: Oral         Complications: No apparent anesthesia complications

## 2018-12-06 NOTE — Anesthesia Postprocedure Evaluation (Signed)
Anesthesia Post Note  Patient: Miguel Pena  Procedure(s) Performed: ESOPHAGOGASTRODUODENOSCOPY (EGD) WITH PROPOFOL (N/A ) COLONOSCOPY WITH PROPOFOL (N/A ) BIOPSY     Patient location during evaluation: PACU Anesthesia Type: MAC Level of consciousness: awake and alert Pain management: pain level controlled Vital Signs Assessment: post-procedure vital signs reviewed and stable Respiratory status: spontaneous breathing, nonlabored ventilation and respiratory function stable Cardiovascular status: stable and blood pressure returned to baseline Anesthetic complications: no    Last Vitals:  Vitals:   12/06/18 0840 12/06/18 0850  BP: (!) 107/50 118/62  Pulse: 61 (!) 55  Resp: 20 13  Temp:    SpO2: 98% 98%    Last Pain:  Vitals:   12/06/18 0850  TempSrc:   PainSc: 0-No pain                 Audry Pili

## 2018-12-06 NOTE — Discharge Instructions (Signed)
YOU HAD AN ENDOSCOPIC PROCEDURE TODAY: Refer to the procedure report and other information in the discharge instructions given to you for any specific questions about what was found during the examination. If this information does not answer your questions, please call [place] office at [phone] to clarify.  ° °YOU SHOULD EXPECT: Some feelings of bloating in the abdomen. Passage of more gas than usual. Walking can help get rid of the air that was put into your GI tract during the procedure and reduce the bloating. If you had a lower endoscopy (such as a colonoscopy or flexible sigmoidoscopy) you may notice spotting of blood in your stool or on the toilet paper. Some abdominal soreness may be present for a day or two, also. ° °DIET: Your first meal following the procedure should be a light meal and then it is ok to progress to your normal diet. A half-sandwich or bowl of soup is an example of a good first meal. Heavy or fried foods are harder to digest and may make you feel nauseous or bloated. Drink plenty of fluids but you should avoid alcoholic beverages for 24 hours. If you had a esophageal dilation, please see attached instructions for diet.   ° °ACTIVITY: Your care partner should take you home directly after the procedure. You should plan to take it easy, moving slowly for the rest of the day. You can resume normal activity the day after the procedure however YOU SHOULD NOT DRIVE, use power tools, machinery or perform tasks that involve climbing or major physical exertion for 24 hours (because of the sedation medicines used during the test).  ° °SYMPTOMS TO REPORT IMMEDIATELY: °A gastroenterologist can be reached at any hour. Please call [phone]  for any of the following symptoms:  °Following lower endoscopy (colonoscopy, flexible sigmoidoscopy) °Excessive amounts of blood in the stool  °Significant tenderness, worsening of abdominal pains  °Swelling of the abdomen that is new, acute  °Fever of 100° or  higher  °Following upper endoscopy (EGD, EUS, ERCP, esophageal dilation) °Vomiting of blood or coffee ground material  °New, significant abdominal pain  °New, significant chest pain or pain under the shoulder blades  °Painful or persistently difficult swallowing  °New shortness of breath  °Black, tarry-looking or red, bloody stools ° °FOLLOW UP:  °If any biopsies were taken you will be contacted by phone or by letter within the next 1-3 weeks. Call [phone]  if you have not heard about the biopsies in 3 weeks.  °Please also call with any specific questions about appointments or follow up tests. ° °

## 2018-12-07 ENCOUNTER — Other Ambulatory Visit: Payer: Self-pay

## 2018-12-07 ENCOUNTER — Encounter (HOSPITAL_COMMUNITY): Payer: Self-pay | Admitting: Gastroenterology

## 2018-12-07 MED ORDER — SPIRONOLACTONE 25 MG PO TABS: 25.0000 mg | ORAL_TABLET | Freq: Every day | ORAL | 1 refills | Status: DC

## 2018-12-08 LAB — SURGICAL PATHOLOGY

## 2018-12-09 DIAGNOSIS — I5042 Chronic combined systolic (congestive) and diastolic (congestive) heart failure: Secondary | ICD-10-CM | POA: Diagnosis not present

## 2018-12-09 DIAGNOSIS — Z4502 Encounter for adjustment and management of automatic implantable cardiac defibrillator: Secondary | ICD-10-CM | POA: Diagnosis not present

## 2018-12-09 DIAGNOSIS — Z95810 Presence of automatic (implantable) cardiac defibrillator: Secondary | ICD-10-CM | POA: Diagnosis not present

## 2018-12-20 ENCOUNTER — Encounter: Payer: Self-pay | Admitting: Cardiology

## 2018-12-20 ENCOUNTER — Other Ambulatory Visit: Payer: Self-pay

## 2018-12-20 ENCOUNTER — Ambulatory Visit (INDEPENDENT_AMBULATORY_CARE_PROVIDER_SITE_OTHER): Payer: 59 | Admitting: Cardiology

## 2018-12-20 VITALS — BP 127/77 | HR 67 | Temp 97.2°F | Ht 70.0 in | Wt 198.0 lb

## 2018-12-20 DIAGNOSIS — E781 Pure hyperglyceridemia: Secondary | ICD-10-CM | POA: Diagnosis not present

## 2018-12-20 DIAGNOSIS — I428 Other cardiomyopathies: Secondary | ICD-10-CM

## 2018-12-20 DIAGNOSIS — I2510 Atherosclerotic heart disease of native coronary artery without angina pectoris: Secondary | ICD-10-CM | POA: Diagnosis not present

## 2018-12-20 DIAGNOSIS — I447 Left bundle-branch block, unspecified: Secondary | ICD-10-CM

## 2018-12-20 NOTE — Progress Notes (Signed)
Subjective:   Miguel Pena, male    DOB: 1965/06/04, 53 y.o.   MRN: [id number]   Chief complaint:  Cardiomyopathy  HPI  53 y/o Caucasian male with nonischemic dilated cardiomyopathy with now mormalization of LVEF post CRT-D placement 03/2017, single vessel CAD s/p successful PCI to OM1 with 3.5 X 16 mm DES 10/2016, hyperlipidemia, prior smoking history, testosterone deficiency.  He has been doing well. He does not have any exertional chest pain, shortness of breath. He has been diagnosed with Crohn's disease and is seeing a gastroenterologist. He does report having bleeding from his hemorrhoids.   Past Medical History:  Diagnosis Date  . Bursitis of right shoulder   . CHF (congestive heart failure) ([place])    [name] 10/24/2016  . Chronic combined systolic and diastolic heart failure ([place]) 03/16/2017  . Dilated cardiomyopathy ([place])    [name] 10/24/2016  . Dysrhythmia 1995   "VT" per patient- states had neg stress test and no problems since  . Encounter for adjustment or management of automatic implantable cardioverter-defibrillator 06/17/2018  . Encounter for assessment of automatic implantable cardioverter-defibrillator (AICD) 06/17/2018  . Heart murmur    "when I was young"  . History of kidney stones   . Hypercholesterolemia   . ICD: Medtronic MRI Quad CRTD (Bi-V ICD) implantation 03/16/2017 [name] 03/16/2017   Scheduled Remote ICD check   8.7.20: No VHR episodes. No mode switches. Normal health trends. Trans-thoracic impedance trends and the OptiVol Fluid Index do no present significant abnormalities. Battery longevity is 6.5-8.3 years. RA pacing is 8.3 %, RV pacing is 99.9 %, and LV pacing is 99.9 %.  Clinic: 05/18/17  . NICM (nonischemic cardiomyopathy) ([place]) 03/16/2017     Past Surgical History:  Procedure Laterality Date  . BIOPSY  12/06/2018   Procedure: BIOPSY;  Surgeon: Mauri Pole, MD;  Location: [facility];  Service: Endoscopy;;  Egd and Colon  . BIV  ICD INSERTION CRT-D N/A 03/16/2017   Procedure: BIV ICD INSERTION CRT-D;  Surgeon: Deboraha Sprang, MD;  Location: [place] [facility];  Service: Cardiovascular;  Laterality: N/A;  . COLONOSCOPY WITH PROPOFOL N/A 03/10/2017   Procedure: COLONOSCOPY WITH PROPOFOL;  Surgeon: Mauri Pole, MD;  Location: [facility];  Service: Endoscopy;  Laterality: N/A;  . COLONOSCOPY WITH PROPOFOL N/A 12/06/2018   Procedure: COLONOSCOPY WITH PROPOFOL;  Surgeon: Mauri Pole, MD;  Location: [facility];  Service: Endoscopy;  Laterality: N/A;  . CORONARY ANGIOPLASTY WITH STENT PLACEMENT  10/28/2016  . CORONARY STENT INTERVENTION N/A 10/28/2016   Procedure: CORONARY STENT INTERVENTION;  Surgeon: [name], MD;  Location: [place] [facility];  Service: Cardiovascular;  Laterality: N/A;  . ESOPHAGOGASTRODUODENOSCOPY (EGD) WITH PROPOFOL N/A 03/10/2017   Procedure: ESOPHAGOGASTRODUODENOSCOPY (EGD) WITH PROPOFOL;  Surgeon: Mauri Pole, MD;  Location: [facility];  Service: Endoscopy;  Laterality: N/A;  . ESOPHAGOGASTRODUODENOSCOPY (EGD) WITH PROPOFOL N/A 12/06/2018   Procedure: ESOPHAGOGASTRODUODENOSCOPY (EGD) WITH PROPOFOL;  Surgeon: Mauri Pole, MD;  Location: [facility];  Service: Endoscopy;  Laterality: N/A;  . INGUINAL HERNIA REPAIR Right 02/16/2013   Procedure: RIGHT INGUINAL HERNIA REPAIR ;  Surgeon: [name], MD;  Location: [facility];  Service: General;  Laterality: Right;  With MESH  . INGUINAL HERNIA REPAIR Right 02/16/2013  . INTRAVASCULAR PRESSURE WIRE/FFR STUDY N/A 10/28/2016   Procedure: INTRAVASCULAR PRESSURE WIRE/FFR STUDY;  Surgeon: [name], MD;  Location: [place] [facility];  Service: Cardiovascular;  Laterality: N/A;  . RIGHT/LEFT  HEART CATH AND CORONARY ANGIOGRAPHY N/A 10/28/2016   Procedure: RIGHT/LEFT HEART CATH AND CORONARY ANGIOGRAPHY;  Surgeon: [name], MD;  Location: [place] [facility];  Service: Cardiovascular;  Laterality: N/A;   . ULTRASOUND GUIDANCE FOR VASCULAR ACCESS  10/28/2016   Procedure: Ultrasound Guidance For Vascular Access;  Surgeon: [name], MD;  Location: [place] [facility];  Service: Cardiovascular;;     Social History   Socioeconomic History  . Marital status: Married    Spouse name: Not on file  . Number of children: 2  . Years of education: Not on file  . Highest education level: Not on file  Occupational History  . Occupation: Emergency planning/management officer    Comment: [employer]  Tobacco Use  . Smoking status: Former Smoker    Packs/day: 1.00    Years: 12.00    Pack years: 12.00    Types: Cigarettes, Cigars    Quit date: 02/16/2011    Years since quitting: 7.8  . Smokeless tobacco: Former Systems developer    Types: Snuff  . Tobacco comment: 10/28/2016 'chewed for a couple years after I quit smoking"  Substance and Sexual Activity  . Alcohol use: No    Comment: none since 9/11  . Drug use: No  . Sexual activity: Not Currently  Other Topics Concern  . Not on file  Social History Narrative  . Not on file   Social Determinants of Health   Financial Resource Strain:   . Difficulty of Paying Living Expenses: Not on file  Food Insecurity:   . Worried About Charity fundraiser in the Last Year: Not on file  . Ran Out of Food in the Last Year: Not on file  Transportation Needs:   . Lack of Transportation (Medical): Not on file  . Lack of Transportation (Non-Medical): Not on file  Physical Activity:   . Days of Exercise per Week: Not on file  . Minutes of Exercise per Session: Not on file  Stress:   . Feeling of Stress : Not on file  Social Connections:   . Frequency of Communication with Friends and Family: Not on file  . Frequency of Social Gatherings with Friends and Family: Not on file  . Attends Religious Services: Not on file  . Active Member of Clubs or Organizations: Not on file  . Attends Archivist Meetings: Not on file  . Marital Status: Not on file  Intimate  Partner Violence:   . Fear of Current or Ex-Partner: Not on file  . Emotionally Abused: Not on file  . Physically Abused: Not on file  . Sexually Abused: Not on file     Family History  Problem Relation Age of Onset  . Heart attack Father   . Colon polyps Father   . Colon polyps Brother      Current Outpatient Medications on File Prior to Visit  Medication Sig Dispense Refill  . aspirin 81 MG chewable tablet Chew 1 tablet (81 mg total) by mouth daily. 90 tablet 3  . bisoprolol (ZEBETA) 5 MG tablet TAKE 1 TABLET BY MOUTH EVERY DAY (Patient taking differently: Take 5 mg by mouth daily. ) 90 tablet 3  . dicyclomine (BENTYL) 10 MG capsule Take 1 capsule (10 mg total) by mouth 3 (three) times daily. Take at noon, 4pm & evening (Patient taking differently: Take 10 mg by mouth daily. ) 90 capsule 3  . ENTRESTO 97-103 MG TAKE 1 TABLET BY MOUTH TWICE DAILY (Patient taking  differently: Take 1 tablet by mouth 2 (two) times daily. ) 180 tablet 0  . eszopiclone (LUNESTA) 1 MG TABS tablet Take 0.5 mg by mouth at bedtime as needed for sleep.     . furosemide (LASIX) 40 MG tablet Take 1 tablet (40 mg total) by mouth as needed for edema. (Patient taking differently: Take 40 mg by mouth daily as needed for edema. ) 30 tablet 0  . Homeopathic Products (ZICAM ALLERGY RELIEF NA) Place 1 spray into the nose daily as needed (allergies).    Marland Kitchen HYDROcodone-acetaminophen (NORCO) 10-325 MG tablet Take 1 tablet by mouth every 4 (four) hours as needed for moderate pain.     Marland Kitchen loratadine (CLARITIN) 10 MG tablet Take 10 mg by mouth daily as needed for allergies.    . Multiple Vitamins-Minerals (HAIR/SKIN/NAILS) CAPS Take 2 capsules by mouth daily.    . nicotine polacrilex (NICORETTE) 4 MG gum Take 4 mg by mouth as needed for smoking cessation.    Marland Kitchen omeprazole (PRILOSEC OTC) 20 MG tablet Take 20 mg by mouth 2 (two) times daily.    . potassium chloride SA (K-DUR) 20 MEQ tablet TAKE 1 TABLET BY MOUTH DAILY (Patient  taking differently: Take 20 mEq by mouth every evening. ) 90 tablet 3  . predniSONE (DELTASONE) 20 MG tablet Take 1 tablet (20 mg total) by mouth daily with breakfast. Patient is to take 40 mg daily for 2 weeks then taper by 5 mg weekly (Patient taking differently: Take 20 mg by mouth daily with breakfast. ) 30 tablet 1  . rosuvastatin (CRESTOR) 20 MG tablet TAKE 1 TABLET BY MOUTH DAILY (Patient taking differently: Take 20 mg by mouth every evening. ) 90 tablet 2  . spironolactone (ALDACTONE) 25 MG tablet Take 1 tablet (25 mg total) by mouth daily. 90 tablet 1   No current facility-administered medications on file prior to visit.    Cardiovascular studies:  EKG 12/20/2018: Sinus rhythm 63 bpm.  Biventricular paced rhtyhm.  Scheduled Remote ICD transmission 01/27/2018: No significnat arrhythmias. NO VHR episodes. No mode switches.  Trans-thoracic impedance trends and the Optivol Fluid Index do no present significant abnormalities. Battery longevity is 6.9-8.7 yrs. RA pacing is 10.4 %, RV pacing is 99.9 %, and LV pacing is 99.9 %.  EKG 09/25/2017: Probable sinus bradycardia 49 bpm. BiV paced rhythm   Echocardiogram 09/22/2017: Left ventricle cavity is normal in size. Mild concentric hypertrophy of the left ventricle. Normal global wall motion. Normal diastolic filling pattern. Calculated EF 55%. Left atrial cavity is moderately dilated at 4.8 cm. Right ventricle cavity is normal in size. Normal right ventricular function. Pacemaker lead/ICD lead noted in the RV. Trace aortic regurgitation. Borderline prolapse of the posterior > anterior MV leaflet with resultant Moderate anteriorly directed mitral regurgitation. Mild tricuspid regurgitation. No evidence of pulmonary hypertension. Compared to the study done on 02/06/2017, EF is improved from 20%, moderately severe MR is now moderate at most.  10/28/2016 NLeft/right heart cath, coronary angiography and angioplasty  Nonischemic  cardiomyopathy with single vessel obstructive disease Proximal OM1 3 5 x 16 mm Promus Premier drug-eluting stent  Recent labs: Results for MACGUIRE, HOLSINGER (MRN [id number]) as of 04/22/2018 13:33  Ref. Range 04/19/2018 24:40  BASIC METABOLIC PANEL Unknown Rpt (A)  Sodium Latest Ref Range: 134 - 144 mmol/L 140  Potassium Latest Ref Range: 3.5 - 5.2 mmol/L 4.3  Chloride Latest Ref Range: 96 - 106 mmol/L 103  CO2 Latest Ref Range: 20 - 29 mmol/L 23  Glucose Latest Ref Range: 65 - 99 mg/dL 115 (H)  BUN Latest Ref Range: 6 - 24 mg/dL 9  Creatinine Latest Ref Range: 0.76 - 1.27 mg/dL 0.95  Calcium Latest Ref Range: 8.7 - 10.2 mg/dL 9.9  BUN/Creatinine Ratio Latest Ref Range: 9 - 20  9  GFR, Est Non African American Latest Ref Range: >59 mL/min/1.73 90  GFR, Est African American Latest Ref Range: >59 mL/min/1.73 104    Review of Systems  Constitution: Negative for decreased appetite, malaise/fatigue, weight gain and weight loss.  HENT: Negative for congestion.   Eyes: Negative for visual disturbance.  Cardiovascular: Negative for chest pain, dyspnea on exertion, leg swelling, palpitations and syncope.  Respiratory: Negative for shortness of breath.   Endocrine: Negative for cold intolerance.  Hematologic/Lymphatic: Does not bruise/bleed easily.  Skin: Negative for itching and rash.  Musculoskeletal: Negative for myalgias.  Gastrointestinal: Positive for bloating and abdominal pain (With heavy lifting). Negative for nausea and vomiting.  Genitourinary: Negative for dysuria.  Neurological: Negative for dizziness and weakness.  Psychiatric/Behavioral: The patient is not nervous/anxious.   All other systems reviewed and are negative.      Vitals:   12/20/18 1503 12/20/18 1513  BP: (!) 146/79 127/77  Pulse: 64 67  Temp: (!) 97.2 F (36.2 C)   SpO2: 97%        Objective:     Physical Exam  Constitutional: He is oriented to person, place, and time. He appears well-developed and  well-nourished. No distress.  HENT:  Head: Normocephalic and atraumatic.  Eyes: Pupils are equal, round, and reactive to light. Conjunctivae are normal.  Neck: No JVD present.  Cardiovascular: Normal rate, regular rhythm and intact distal pulses.  No murmur heard. Pulmonary/Chest: Effort normal and breath sounds normal. He has no wheezes. He has no rales.  Abdominal: Soft. Bowel sounds are normal. There is no rebound.  Musculoskeletal:        General: No edema.  Lymphadenopathy:    He has no cervical adenopathy.  Neurological: He is alert and oriented to person, place, and time. No cranial nerve deficit.  Skin: Skin is warm and dry.  Psychiatric: He has a normal mood and affect.  Nursing note and vitals reviewed.        Assessment & Recommendations:   53 y/o Caucasian male with nonischemic dilated cardiomyopathy with now mormalization of LVEF post CRT-D placement 03/2017, single vessel CAD s/p successful PCI to OM1 with 3.5 X 16 mm DES 10/2016, hyperlipidemia, prior smoking history, testosterone deficiency.  Nonischemic cardiomyopathy: Clinically compensated. No CHF on ICD monitoring.  Continue bisoprolol to 5 mg daily, Entresto 97-103 mg bid, Spironolactone 25 mg daily. Resting HR <70 bpm. Corlanor not indicated.  EF normalized to 55% on above medical therapy (Echo 09/2017).  CAD: Stable with no angina symnptoms. Conitnue statin. Given >1 yr since PCI, and Crohn's disease and hemorrhoidal bleeding, okay to stop Aspirin. Check BMP and lipid panel now, and repeat in 12/2019. ICD check and OV in 12/2019.   [name], MD [facility] Pager: [phone] Office: [phone] If no answer Cell [phone]

## 2019-01-01 ENCOUNTER — Other Ambulatory Visit: Payer: Self-pay | Admitting: Cardiology

## 2019-01-06 ENCOUNTER — Telehealth: Payer: Self-pay | Admitting: Cardiology

## 2019-01-06 LAB — BASIC METABOLIC PANEL
BUN/Creatinine Ratio: 11 (ref 9–20)
BUN: 10 mg/dL (ref 6–24)
CO2: 23 mmol/L (ref 20–29)
Calcium: 10 mg/dL (ref 8.7–10.2)
Chloride: 103 mmol/L (ref 96–106)
Creatinine, Ser: 0.92 mg/dL (ref 0.76–1.27)
GFR calc Af Amer: 109 mL/min/{1.73_m2} (ref >59)
GFR calc non Af Amer: 94 mL/min/{1.73_m2} (ref >59)
Glucose: 114 mg/dL — ABNORMAL HIGH (ref 65–99)
Potassium: 4.3 mmol/L (ref 3.5–5.2)
Sodium: 141 mmol/L (ref 134–144)

## 2019-01-06 LAB — LIPID PANEL
Chol/HDL Ratio: 2.8 ratio (ref 0.0–5.0)
Cholesterol, Total: 139 mg/dL (ref 100–199)
HDL: 50 mg/dL (ref >39)
LDL Chol Calc (NIH): 52 mg/dL (ref 0–99)
Triglycerides: 236 mg/dL — ABNORMAL HIGH (ref 0–149)
VLDL Cholesterol Cal: 37 mg/dL (ref 5–40)

## 2019-01-06 MED ORDER — ROSUVASTATIN CALCIUM 40 MG PO TABS: 40.0000 mg | ORAL_TABLET | Freq: Every day | ORAL | 3 refills | Status: DC

## 2019-01-06 MED ORDER — ROSUVASTATIN CALCIUM 40 MG PO TABS: 20.0000 mg | ORAL_TABLET | Freq: Every day | ORAL | 3 refills | Status: DC

## 2019-01-06 NOTE — Telephone Encounter (Signed)
Pt called saying he received a call. I told him it was most likely about the lipid panel that was just ordered. He said he had just gotten one done yesterday.

## 2019-01-06 NOTE — Telephone Encounter (Signed)
Gave patient lab results. Explained that hid lipids were significantly increased and that he is to increase Crestor to taking 42m daily, work on controlling his diet and repeat lipids in 6 months.

## 2019-01-06 NOTE — Addendum Note (Signed)
Addended by: [name] on: 01/06/2019 11:14 AM   Modules accepted: Orders

## 2019-01-12 ENCOUNTER — Encounter: Payer: Self-pay | Admitting: Gastroenterology

## 2019-01-12 ENCOUNTER — Ambulatory Visit (INDEPENDENT_AMBULATORY_CARE_PROVIDER_SITE_OTHER): Payer: 59 | Admitting: Gastroenterology

## 2019-01-12 VITALS — BP 100/62 | HR 64 | Temp 97.4°F | Ht 70.0 in | Wt 201.8 lb

## 2019-01-12 DIAGNOSIS — K641 Second degree hemorrhoids: Secondary | ICD-10-CM | POA: Diagnosis not present

## 2019-01-12 DIAGNOSIS — K5000 Crohn's disease of small intestine without complications: Secondary | ICD-10-CM | POA: Diagnosis not present

## 2019-01-12 DIAGNOSIS — Z95810 Presence of automatic (implantable) cardiac defibrillator: Secondary | ICD-10-CM

## 2019-01-12 DIAGNOSIS — K625 Hemorrhage of anus and rectum: Secondary | ICD-10-CM | POA: Diagnosis not present

## 2019-01-12 DIAGNOSIS — K2100 Gastro-esophageal reflux disease with esophagitis, without bleeding: Secondary | ICD-10-CM

## 2019-01-12 DIAGNOSIS — I5042 Chronic combined systolic (congestive) and diastolic (congestive) heart failure: Secondary | ICD-10-CM

## 2019-01-12 DIAGNOSIS — Z7952 Long term (current) use of systemic steroids: Secondary | ICD-10-CM

## 2019-01-12 DIAGNOSIS — Z4502 Encounter for adjustment and management of automatic implantable cardiac defibrillator: Secondary | ICD-10-CM

## 2019-01-12 MED ORDER — HYDROCORTISONE ACETATE 25 MG RE SUPP: 25.0000 mg | Freq: Every day | RECTAL | 1 refills | Status: DC

## 2019-01-12 NOTE — Patient Instructions (Signed)
Prednisone Taper:  15 mg for 1 week  10 mg for 1 week 5 mg for 2 weeks  Then STOP  Continue Omeprazole 40 mg daily  Continue Dicyclomine as needed   We will send Anusol suppositories to use at bedtime as needed to your pharmacy  Take Fiber Choice 1 tablet twice daily  Follow up in 3 months  If you are age 55 or older, your body mass index should be between 23-30. Your Body mass index is 28.96 kg/m. If this is out of the aforementioned range listed, please consider follow up with your Primary Care Provider.  If you are age 32 or younger, your body mass index should be between 19-25. Your Body mass index is 28.96 kg/m. If this is out of the aformentioned range listed, please consider follow up with your Primary Care Provider.    I appreciate the  opportunity to care for you  Thank You   Harl Bowie , MD

## 2019-01-12 NOTE — Progress Notes (Signed)
Miguel Pena    [id number]    Jul 13, 1965  Primary Care Physician:Elkins, Curt Jews, MD  Referring Physician: [name], MD [address] [place],  [place] [address]   Chief complaint: Crohn's disease  HPI: 54 year old male with history of CHF, CAD on Plavix, recently diagnosed with Crohn's disease, confirmed with terminal ileal biopsies.  He has findings concerning for Crohn's disease based on abnormal CT abdomen pelvis, and small bowel video capsule. Last office visit November 26, 2018 He is currently taking prednisone 77m daily.  Overall feels better.  He has gained significant weight while on prednisone. [place] any heartburn, dysphagia, nausea, vomiting, abdominal pain or melena.  He has intermittent bright red blood per rectum from hemorrhoids with excessive straining.  Denies constipation but he tries to strain on most days as he is in a hurry to go.   Relevant GI history: EGD December 06, 2018: LA grade C esophagitis and gastritis.  H. pylori negative  Colonoscopy December 06, 2018: Terminal ileum ulceration with ileitis, biopsies consistent with IBD.  Diverticulosis and internal hemorrhoids otherwise normal exam.  03/10/2017 EGD : LA grade C reflux and candidiasis esophagitis, benign-appearing esophageal stenosis, gastritis with hemorrhage, duodenal erosions without bleeding otherwise normal. Started on Nexium 40 twice daily.   03/10/2017 Colonoscopy : moderate diverticulosis in the sigmoid and descending colon, narrowing of the colon in association with diverticular opening, peridiverticular erythema, 1 5 mm polyp in the sigmoid colon and nonbleeding internal hemorrhoids.  Pathology showed chronic gastritis and reflux esophagitis negative for H. pylori or dysplasia. Polyp removed was hyperplastic. Recall colonoscopy recommended in 10 years.  CT abdomen pelvis with contrast 09/29/2018 with inflammatory changes and thickening of the  distal and terminal ileum which may represent infectious enteritis or an inflammatory bowel disease. No bowel obstruction, normal appendix. Sigmoid diverticulosis and cholelithiasis.  Outpatient Encounter Medications as of 01/12/2019  Medication Sig  . bisoprolol (ZEBETA) 5 MG tablet TAKE 1 TABLET BY MOUTH EVERY DAY (Patient taking differently: Take 5 mg by mouth daily. )  . dicyclomine (BENTYL) 10 MG capsule Take 1 capsule (10 mg total) by mouth 3 (three) times daily. Take at noon, 4pm & evening (Patient taking differently: Take 10 mg by mouth daily. )  . eszopiclone (LUNESTA) 1 MG TABS tablet Take 0.5 mg by mouth at bedtime as needed for sleep.   . furosemide (LASIX) 40 MG tablet Take 1 tablet (40 mg total) by mouth as needed for edema. (Patient taking differently: Take 40 mg by mouth daily as needed for edema. )  . Homeopathic Products (ZICAM ALLERGY RELIEF NA) Place 1 spray into the nose daily as needed (allergies).  .Marland KitchenHYDROcodone-acetaminophen (NORCO) 10-325 MG tablet Take 1 tablet by mouth every 4 (four) hours as needed for moderate pain.   .Marland Kitchenloratadine (CLARITIN) 10 MG tablet Take 10 mg by mouth daily as needed for allergies.  . Multiple Vitamins-Minerals (HAIR/SKIN/NAILS) CAPS Take 2 capsules by mouth daily.  . nicotine polacrilex (NICORETTE) 4 MG gum Take 4 mg by mouth as needed for smoking cessation.  .Marland Kitchenomeprazole (PRILOSEC OTC) 20 MG tablet Take 20 mg by mouth 2 (two) times daily.  . potassium chloride SA (K-DUR) 20 MEQ tablet TAKE 1 TABLET BY MOUTH DAILY (Patient taking differently: Take 20 mEq by mouth every evening. )  . predniSONE (DELTASONE) 20 MG tablet Take 1 tablet (20 mg total) by mouth daily with breakfast. Patient is to take 40  mg daily for 2 weeks then taper by 5 mg weekly (Patient taking differently: Take 20 mg by mouth daily with breakfast. )  . rosuvastatin (CRESTOR) 40 MG tablet Take 1 tablet (40 mg total) by mouth daily.  . sacubitril-valsartan (ENTRESTO) 97-103 MG Take 1  tablet by mouth 2 (two) times daily.  Marland Kitchen spironolactone (ALDACTONE) 25 MG tablet Take 1 tablet (25 mg total) by mouth daily.   No facility-administered encounter medications on file as of 01/12/2019.    Allergies as of 01/12/2019  . (No Known Allergies)    Past Medical History:  Diagnosis Date  . Bursitis of right shoulder   . CHF (congestive heart failure) ([place])    [name] 10/24/2016  . Chronic combined systolic and diastolic heart failure ([place]) 03/16/2017  . Dilated cardiomyopathy ([place])    [name] 10/24/2016  . Dysrhythmia 1995   "VT" per patient- states had neg stress test and no problems since  . Encounter for adjustment or management of automatic implantable cardioverter-defibrillator 06/17/2018  . Encounter for assessment of automatic implantable cardioverter-defibrillator (AICD) 06/17/2018  . Heart murmur    "when I was young"  . History of kidney stones   . Hypercholesterolemia   . ICD: Medtronic MRI Quad CRTD (Bi-V ICD) implantation 03/16/2017 [name] 03/16/2017   Scheduled Remote ICD check   8.7.20: No VHR episodes. No mode switches. Normal health trends. Trans-thoracic impedance trends and the OptiVol Fluid Index do no present significant abnormalities. Battery longevity is 6.5-8.3 years. RA pacing is 8.3 %, RV pacing is 99.9 %, and LV pacing is 99.9 %.  Clinic: 05/18/17  . NICM (nonischemic cardiomyopathy) ([place]) 03/16/2017    Past Surgical History:  Procedure Laterality Date  . BIOPSY  12/06/2018   Procedure: BIOPSY;  Surgeon: Mauri Pole, MD;  Location: [facility];  Service: Endoscopy;;  Egd and Colon  . BIV ICD INSERTION CRT-D N/A 03/16/2017   Procedure: BIV ICD INSERTION CRT-D;  Surgeon: Deboraha Sprang, MD;  Location: [facility];  Service: Cardiovascular;  Laterality: N/A;  . COLONOSCOPY WITH PROPOFOL N/A 03/10/2017   Procedure: COLONOSCOPY WITH PROPOFOL;  Surgeon: Mauri Pole, MD;  Location: [facility];  Service: Endoscopy;  Laterality: N/A;  .  COLONOSCOPY WITH PROPOFOL N/A 12/06/2018   Procedure: COLONOSCOPY WITH PROPOFOL;  Surgeon: Mauri Pole, MD;  Location: [facility];  Service: Endoscopy;  Laterality: N/A;  . CORONARY ANGIOPLASTY WITH STENT PLACEMENT  10/28/2016  . CORONARY STENT INTERVENTION N/A 10/28/2016   Procedure: CORONARY STENT INTERVENTION;  Surgeon: [name], MD;  Location: [facility];  Service: Cardiovascular;  Laterality: N/A;  . ESOPHAGOGASTRODUODENOSCOPY (EGD) WITH PROPOFOL N/A 03/10/2017   Procedure: ESOPHAGOGASTRODUODENOSCOPY (EGD) WITH PROPOFOL;  Surgeon: Mauri Pole, MD;  Location: [facility];  Service: Endoscopy;  Laterality: N/A;  . ESOPHAGOGASTRODUODENOSCOPY (EGD) WITH PROPOFOL N/A 12/06/2018   Procedure: ESOPHAGOGASTRODUODENOSCOPY (EGD) WITH PROPOFOL;  Surgeon: Mauri Pole, MD;  Location: [facility];  Service: Endoscopy;  Laterality: N/A;  . INGUINAL HERNIA REPAIR Right 02/16/2013   Procedure: RIGHT INGUINAL HERNIA REPAIR ;  Surgeon: [name], MD;  Location: [facility];  Service: General;  Laterality: Right;  With MESH  . INGUINAL HERNIA REPAIR Right 02/16/2013  . INTRAVASCULAR PRESSURE WIRE/FFR STUDY N/A 10/28/2016   Procedure: INTRAVASCULAR PRESSURE WIRE/FFR STUDY;  Surgeon: [name], MD;  Location: [facility];  Service: Cardiovascular;  Laterality: N/A;  . RIGHT/LEFT HEART CATH AND CORONARY ANGIOGRAPHY N/A 10/28/2016   Procedure: RIGHT/LEFT  HEART CATH AND CORONARY ANGIOGRAPHY;  Surgeon: [name], MD;  Location: [facility];  Service: Cardiovascular;  Laterality: N/A;  . ULTRASOUND GUIDANCE FOR VASCULAR ACCESS  10/28/2016   Procedure: Ultrasound Guidance For Vascular Access;  Surgeon: [name], MD;  Location: [facility];  Service: Cardiovascular;;    Family History  Problem Relation Age of Onset  . Heart attack Father   . Colon polyps Father   . Colon polyps Brother     Social History    Socioeconomic History  . Marital status: Married    Spouse name: Not on file  . Number of children: 2  . Years of education: Not on file  . Highest education level: Not on file  Occupational History  . Occupation: Emergency planning/management officer    Comment: [employer]  Tobacco Use  . Smoking status: Former Smoker    Packs/day: 1.00    Years: 12.00    Pack years: 12.00    Types: Cigarettes, Cigars    Quit date: 02/16/2011    Years since quitting: 7.9  . Smokeless tobacco: Former Systems developer    Types: Snuff  . Tobacco comment: 10/28/2016 'chewed for a couple years after I quit smoking"  Substance and Sexual Activity  . Alcohol use: No    Comment: none since 9/11  . Drug use: No  . Sexual activity: Not Currently  Other Topics Concern  . Not on file  Social History Narrative  . Not on file   Social Determinants of Health   Financial Resource Strain:   . Difficulty of Paying Living Expenses: Not on file  Food Insecurity:   . Worried About Charity fundraiser in the Last Year: Not on file  . Ran Out of Food in the Last Year: Not on file  Transportation Needs:   . Lack of Transportation (Medical): Not on file  . Lack of Transportation (Non-Medical): Not on file  Physical Activity:   . Days of Exercise per Week: Not on file  . Minutes of Exercise per Session: Not on file  Stress:   . Feeling of Stress : Not on file  Social Connections:   . Frequency of Communication with Friends and Family: Not on file  . Frequency of Social Gatherings with Friends and Family: Not on file  . Attends Religious Services: Not on file  . Active Member of Clubs or Organizations: Not on file  . Attends Archivist Meetings: Not on file  . Marital Status: Not on file  Intimate Partner Violence:   . Fear of Current or Ex-Partner: Not on file  . Emotionally Abused: Not on file  . Physically Abused: Not on file  . Sexually Abused: Not on file      Review of systems: Review of Systems   Constitutional: Negative for fever and chills.  HENT: Negative.   Eyes: Negative for blurred vision.  Respiratory: Negative for cough, shortness of breath and wheezing.   Cardiovascular: Negative for chest pain and palpitations.  Gastrointestinal: as per HPI Genitourinary: Negative for dysuria, urgency, frequency and hematuria.  Musculoskeletal: Negative for myalgias, back pain and joint pain.  Skin: Negative for itching and rash.  Neurological: Negative for dizziness, tremors, focal weakness, seizures and loss of consciousness.  Endo/Heme/Allergies: Negative Psychiatric/Behavioral: Negative for depression, suicidal ideas and hallucinations.  All other systems reviewed and are negative.   Physical Exam: Vitals:   01/12/19 1001  BP: 100/62  Pulse: 64  Temp: (!) 97.4  F (36.3 C)   Body mass index is 28.96 kg/m. Gen:      No acute distress HEENT:  EOMI, sclera anicteric Neck:     No masses; no thyromegaly Lungs:    Clear to auscultation bilaterally; normal respiratory effort CV:         Regular rate and rhythm; no murmurs Abd:      + bowel sounds; soft, non-tender; no palpable masses, no distension Ext:    No edema; adequate peripheral perfusion Skin:      Warm and dry; no rash Neuro: alert and oriented x 3 Psych: normal mood and affect  Data Reviewed:  Reviewed labs, radiology imaging, old records and pertinent past GI work up   Assessment and Plan/Recommendations:  54 year old male with history of CAD s/p drug-eluting stent on Plavix, CHF with recent diagnosis of Crohn's disease predominantly involving the small bowel  He is in clinical remission on prednisone, will continue to taper it down by 5 mg weekly until he reaches 5 mg daily, continue for 2 additional weeks and then stop Discussed long-term ADR with continued prednisone use  He is reluctant to start any immunosuppressive therapy or Biologics  We will continue to monitor clinically, repeat CRP once he is off  prednisone and also obtain fecal calprotectin to monitor disease activity with Crohn's   If he has recurrent flare of symptoms with Crohn's disease, we can try Entocort 9 mg daily instead of prednisone to potentially decrease side effects with systemic steroids.  Declined flu shot TB QuantiFERON negative  Bright red blood per rectum from hemorrhoids: Start fiber choice 1 tablet twice daily Avoid excessive straining Anusol suppository per rectum at bedtime as needed  GERD with reflux esophagitis: Continue omeprazole and antireflux measures  IBS, abdominal cramping continue dicyclomine 10 mg as needed  Return in 2 to 3 months or sooner if needed    K. Denzil Magnuson , MD    CC: [name], *

## 2019-01-13 ENCOUNTER — Telehealth: Payer: Self-pay

## 2019-01-13 MED ORDER — HYDROCORTISONE (PERIANAL) 2.5 % EX CREA: 1.0000 "application " | TOPICAL_CREAM | Freq: Two times a day (BID) | CUTANEOUS | 1 refills | Status: DC

## 2019-01-13 NOTE — Telephone Encounter (Signed)
Prescription sent to patient's pharmacy for hydrocortisone cream.

## 2019-01-13 NOTE — Telephone Encounter (Signed)
Received fax from pharmacy stating patient's plan is over $120 for anucort suppositories and pharmacy would like to switch the medication to cream. Is this appropriate?

## 2019-01-13 NOTE — Telephone Encounter (Signed)
Ok to switch to cream or can do Preparation H suppository over the counter. He doesn't have to fill the Rx for Anucort. Thanks

## 2019-01-26 ENCOUNTER — Other Ambulatory Visit: Payer: Self-pay | Admitting: Gastroenterology

## 2019-01-26 NOTE — Telephone Encounter (Signed)
Miguel Pena, can you please send prednisone 5 mg tablets x60 with no refills.  He should be tapering down by 5 mg.  Follow-up in office visit as previously scheduled.  Thanks

## 2019-01-26 NOTE — Telephone Encounter (Signed)
Dr Silverio Decamp can patient have refills of Prednisone

## 2019-01-28 MED ORDER — PREDNISONE 5 MG PO TABS: 5.0000 mg | ORAL_TABLET | Freq: Every day | ORAL | 0 refills | Status: DC

## 2019-01-28 NOTE — Telephone Encounter (Signed)
Sent refills of prednisone 5 mg tablets so he could continue taper, Needs office visit scheduled   Appointment scheduled

## 2019-01-31 ENCOUNTER — Other Ambulatory Visit: Payer: Self-pay

## 2019-01-31 MED ORDER — ENTRESTO 97-103 MG PO TABS: 1.0000 | ORAL_TABLET | Freq: Two times a day (BID) | ORAL | 3 refills | Status: DC

## 2019-02-25 ENCOUNTER — Ambulatory Visit: Payer: 59 | Admitting: Gastroenterology

## 2019-04-07 ENCOUNTER — Other Ambulatory Visit: Payer: Self-pay | Admitting: Cardiology

## 2019-04-07 DIAGNOSIS — I4729 Other ventricular tachycardia: Secondary | ICD-10-CM

## 2019-04-07 DIAGNOSIS — I5042 Chronic combined systolic (congestive) and diastolic (congestive) heart failure: Secondary | ICD-10-CM

## 2019-04-07 DIAGNOSIS — I472 Ventricular tachycardia: Secondary | ICD-10-CM

## 2019-04-12 ENCOUNTER — Ambulatory Visit: Payer: 59 | Admitting: Gastroenterology

## 2019-04-13 ENCOUNTER — Encounter: Payer: Self-pay | Admitting: Gastroenterology

## 2019-04-13 ENCOUNTER — Ambulatory Visit (INDEPENDENT_AMBULATORY_CARE_PROVIDER_SITE_OTHER): Payer: 59 | Admitting: Gastroenterology

## 2019-04-13 ENCOUNTER — Telehealth: Payer: Self-pay

## 2019-04-13 ENCOUNTER — Other Ambulatory Visit (INDEPENDENT_AMBULATORY_CARE_PROVIDER_SITE_OTHER): Payer: 59

## 2019-04-13 VITALS — BP 120/72 | HR 68 | Temp 98.0°F | Ht 70.0 in | Wt 200.4 lb

## 2019-04-13 DIAGNOSIS — K50811 Crohn's disease of both small and large intestine with rectal bleeding: Secondary | ICD-10-CM

## 2019-04-13 DIAGNOSIS — K649 Unspecified hemorrhoids: Secondary | ICD-10-CM

## 2019-04-13 DIAGNOSIS — K50019 Crohn's disease of small intestine with unspecified complications: Secondary | ICD-10-CM

## 2019-04-13 LAB — HEPATIC FUNCTION PANEL
ALT: 28 U/L (ref 0–53)
AST: 17 U/L (ref 0–37)
Albumin: 4.3 g/dL (ref 3.5–5.2)
Alkaline Phosphatase: 53 U/L (ref 39–117)
Bilirubin, Direct: 0.2 mg/dL (ref 0.0–0.3)
Total Bilirubin: 1.2 mg/dL (ref 0.2–1.2)
Total Protein: 6.8 g/dL (ref 6.0–8.3)

## 2019-04-13 LAB — CBC WITH DIFFERENTIAL/PLATELET
Basophils Absolute: 0 10*3/uL (ref 0.0–0.1)
Basophils Relative: 0.5 % (ref 0.0–3.0)
Eosinophils Absolute: 0 10*3/uL (ref 0.0–0.7)
Eosinophils Relative: 0 % (ref 0.0–5.0)
HCT: 36 % — ABNORMAL LOW (ref 39.0–52.0)
Hemoglobin: 12.6 g/dL — ABNORMAL LOW (ref 13.0–17.0)
Lymphocytes Relative: 15.4 % (ref 12.0–46.0)
Lymphs Abs: 1.2 10*3/uL (ref 0.7–4.0)
MCHC: 35 g/dL (ref 30.0–36.0)
MCV: 86.7 fl (ref 78.0–100.0)
Monocytes Absolute: 0.2 10*3/uL (ref 0.1–1.0)
Monocytes Relative: 3 % (ref 3.0–12.0)
Neutro Abs: 6.5 10*3/uL (ref 1.4–7.7)
Neutrophils Relative %: 81.1 % — ABNORMAL HIGH (ref 43.0–77.0)
Platelets: 236 10*3/uL (ref 150.0–400.0)
RBC: 4.15 Mil/uL — ABNORMAL LOW (ref 4.22–5.81)
RDW: 14.7 % (ref 11.5–15.5)
WBC: 8 10*3/uL (ref 4.0–10.5)

## 2019-04-13 LAB — C-REACTIVE PROTEIN: CRP: <1 mg/dL (ref 0.5–20.0)

## 2019-04-13 MED ORDER — MERCAPTOPURINE 50 MG PO TABS: 50.0000 mg | ORAL_TABLET | Freq: Every day | ORAL | 6 refills | Status: DC

## 2019-04-13 NOTE — Telephone Encounter (Signed)
Miguel Pena Key: [id number] - PA Case ID: [id number] for Humira starter kit  Miguel Pena Key: [id number] - PA Case ID: [id number] for Humira 40 mg/0.28m

## 2019-04-13 NOTE — Progress Notes (Signed)
Miguel Pena    [id number]    [birth year]  Primary Care Physician:Elkins, Curt Jews, MD  Referring Physician: [name], MD [address] [place],  [place] [address]   Chief complaint: Crohn's disease HPI: 54 year old male with history of CHF, CAD on Plavix, recently diagnosed with Crohn's disease.  His symptoms recurred once he started tapering prednisone so hence he went back on it and is currently taking prednisone 20 mg daily.  He was having diarrhea and rectal bleeding when he taper down the prednisone.  He is no longer having diarrhea but continues to have bright red blood per rectum most likely associated with bowel movement  No dysphagia, abdominal pain, nausea or vomiting.  Relevant GI history: EGD December 06, 2018: LA grade C esophagitis and gastritis.  H. pylori negative  Colonoscopy December 06, 2018: Terminal ileum ulceration with ileitis, biopsies consistent with IBD.  Diverticulosis and internal hemorrhoids otherwise normal exam.  03/10/2017 EGD:LA grade C reflux and candidiasis esophagitis, benign-appearing esophageal stenosis, gastritis with hemorrhage, duodenal erosions without bleeding otherwise normal. Started on Nexium 40 twice daily.  03/10/2017 Colonoscopy:moderate diverticulosis in the sigmoid and descending colon, narrowing of the colon in association with diverticular opening, peridiverticular erythema, 1 5 mm polyp in the sigmoid colon and nonbleeding internal hemorrhoids.  Pathology showed chronic gastritis and reflux esophagitis negative for H. pylori or dysplasia. Polyp removed was hyperplastic. Recall colonoscopy recommended in 10 years.  CT abdomen pelvis with contrast 09/29/2018 with inflammatory changes and thickening of the distal and terminal ileum which may represent infectious enteritis or an inflammatory bowel disease. No bowel obstruction, normal appendix. Sigmoid diverticulosis and  cholelithiasis.  Outpatient Encounter Medications as of 04/13/2019  Medication Sig  . bisoprolol (ZEBETA) 5 MG tablet TAKE 1 TABLET BY MOUTH EVERY DAY (Patient taking differently: Take 5 mg by mouth daily. )  . dicyclomine (BENTYL) 10 MG capsule Take 1 capsule (10 mg total) by mouth 3 (three) times daily. Take at noon, 4pm & evening (Patient taking differently: Take 10 mg by mouth daily. )  . eszopiclone (LUNESTA) 1 MG TABS tablet Take 0.5 mg by mouth at bedtime as needed for sleep.   . furosemide (LASIX) 40 MG tablet Take 1 tablet (40 mg total) by mouth as needed for edema. (Patient taking differently: Take 40 mg by mouth daily as needed for edema. )  . Homeopathic Products (ZICAM ALLERGY RELIEF NA) Place 1 spray into the nose daily as needed (allergies).  Marland Kitchen HYDROcodone-acetaminophen (NORCO) 10-325 MG tablet Take 1 tablet by mouth every 4 (four) hours as needed for moderate pain.   . hydrocortisone (ANUSOL-HC) 2.5 % rectal cream Place 1 application rectally 2 (two) times daily.  Marland Kitchen loratadine (CLARITIN) 10 MG tablet Take 10 mg by mouth daily as needed for allergies.  . Multiple Vitamins-Minerals (HAIR/SKIN/NAILS) CAPS Take 2 capsules by mouth daily.  . nicotine polacrilex (NICORETTE) 4 MG gum Take 4 mg by mouth as needed for smoking cessation.  Marland Kitchen omeprazole (PRILOSEC OTC) 20 MG tablet Take 20 mg by mouth 2 (two) times daily.  . potassium chloride SA (K-DUR) 20 MEQ tablet TAKE 1 TABLET BY MOUTH DAILY (Patient taking differently: Take 20 mEq by mouth every evening. )  . predniSONE (DELTASONE) 20 MG tablet Take 1 tablet (20 mg total) by mouth daily with breakfast. Patient is to take 40 mg daily for 2 weeks then taper by 5  mg weekly (Patient taking differently: Take 20 mg by mouth daily with breakfast. )  . predniSONE (DELTASONE) 5 MG tablet Take 1 tablet (5 mg total) by mouth daily with breakfast. Should be doing a 5 mg taper as discussed in the office, needs office appointment  . rosuvastatin (CRESTOR)  40 MG tablet Take 1 tablet (40 mg total) by mouth daily.  . sacubitril-valsartan (ENTRESTO) 97-103 MG Take 1 tablet by mouth 2 (two) times daily.  Marland Kitchen spironolactone (ALDACTONE) 25 MG tablet Take 1 tablet (25 mg total) by mouth daily.   No facility-administered encounter medications on file as of 04/13/2019.    Allergies as of 04/13/2019  . (No Known Allergies)    Past Medical History:  Diagnosis Date  . Bursitis of right shoulder   . CHF (congestive heart failure) ([place])    [name] 10/24/2016  . Chronic combined systolic and diastolic heart failure ([place]) 03/16/2017  . Dilated cardiomyopathy ([place])    [name] 10/24/2016  . Dysrhythmia 1995   "VT" per patient- states had neg stress test and no problems since  . Encounter for adjustment or management of automatic implantable cardioverter-defibrillator 06/17/2018  . Encounter for assessment of automatic implantable cardioverter-defibrillator (AICD) 06/17/2018  . Heart murmur    "when I was young"  . History of kidney stones   . Hypercholesterolemia   . ICD: Medtronic MRI Quad CRTD (Bi-V ICD) implantation 03/16/2017 [name] 03/16/2017   Scheduled Remote ICD check   8.7.20: No VHR episodes. No mode switches. Normal health trends. Trans-thoracic impedance trends and the OptiVol Fluid Index do no present significant abnormalities. Battery longevity is 6.5-8.3 years. RA pacing is 8.3 %, RV pacing is 99.9 %, and LV pacing is 99.9 %.  Clinic: 05/18/17  . NICM (nonischemic cardiomyopathy) ([place]) 03/16/2017    Past Surgical History:  Procedure Laterality Date  . BIOPSY  12/06/2018   Procedure: BIOPSY;  Surgeon: Mauri Pole, MD;  Location: [facility];  Service: Endoscopy;;  Egd and Colon  . BIV ICD INSERTION CRT-D N/A 03/16/2017   Procedure: BIV ICD INSERTION CRT-D;  Surgeon: Deboraha Sprang, MD;  Location: [facility];  Service: Cardiovascular;  Laterality: N/A;  . COLONOSCOPY WITH PROPOFOL N/A 03/10/2017   Procedure: COLONOSCOPY WITH  PROPOFOL;  Surgeon: Mauri Pole, MD;  Location: [facility];  Service: Endoscopy;  Laterality: N/A;  . COLONOSCOPY WITH PROPOFOL N/A 12/06/2018   Procedure: COLONOSCOPY WITH PROPOFOL;  Surgeon: Mauri Pole, MD;  Location: [facility];  Service: Endoscopy;  Laterality: N/A;  . CORONARY ANGIOPLASTY WITH STENT PLACEMENT  10/28/2016  . CORONARY STENT INTERVENTION N/A 10/28/2016   Procedure: CORONARY STENT INTERVENTION;  Surgeon: [name], MD;  Location: [facility];  Service: Cardiovascular;  Laterality: N/A;  . ESOPHAGOGASTRODUODENOSCOPY (EGD) WITH PROPOFOL N/A 03/10/2017   Procedure: ESOPHAGOGASTRODUODENOSCOPY (EGD) WITH PROPOFOL;  Surgeon: Mauri Pole, MD;  Location: [facility];  Service: Endoscopy;  Laterality: N/A;  . ESOPHAGOGASTRODUODENOSCOPY (EGD) WITH PROPOFOL N/A 12/06/2018   Procedure: ESOPHAGOGASTRODUODENOSCOPY (EGD) WITH PROPOFOL;  Surgeon: Mauri Pole, MD;  Location: [facility];  Service: Endoscopy;  Laterality: N/A;  . INGUINAL HERNIA REPAIR Right 02/16/2013   Procedure: RIGHT INGUINAL HERNIA REPAIR ;  Surgeon: [name], MD;  Location: [facility];  Service: General;  Laterality: Right;  With MESH  . INGUINAL HERNIA REPAIR Right 02/16/2013  . INTRAVASCULAR PRESSURE WIRE/FFR STUDY N/A 10/28/2016   Procedure: INTRAVASCULAR PRESSURE WIRE/FFR STUDY;  Surgeon: [name], MD;  Location:  [facility];  Service: Cardiovascular;  Laterality: N/A;  . RIGHT/LEFT HEART CATH AND CORONARY ANGIOGRAPHY N/A 10/28/2016   Procedure: RIGHT/LEFT HEART CATH AND CORONARY ANGIOGRAPHY;  Surgeon: [name], MD;  Location: [facility];  Service: Cardiovascular;  Laterality: N/A;  . ULTRASOUND GUIDANCE FOR VASCULAR ACCESS  10/28/2016   Procedure: Ultrasound Guidance For Vascular Access;  Surgeon: [name], MD;  Location: [facility];  Service: Cardiovascular;;    Family History  Problem Relation Age of  Onset  . Heart attack Father   . Colon polyps Father   . Colon polyps Brother     Social History   Socioeconomic History  . Marital status: Married    Spouse name: Not on file  . Number of children: 2  . Years of education: Not on file  . Highest education level: Not on file  Occupational History  . Occupation: Emergency planning/management officer    Comment: [employer]  Tobacco Use  . Smoking status: Former Smoker    Packs/day: 1.00    Years: 12.00    Pack years: 12.00    Types: Cigarettes, Cigars    Quit date: 02/16/2011    Years since quitting: 8.1  . Smokeless tobacco: Former Systems developer    Types: Snuff  . Tobacco comment: 10/28/2016 'chewed for a couple years after I quit smoking"  Substance and Sexual Activity  . Alcohol use: No    Comment: none since 9/11  . Drug use: No  . Sexual activity: Not Currently  Other Topics Concern  . Not on file  Social History Narrative  . Not on file   Social Determinants of Health   Financial Resource Strain:   . Difficulty of Paying Living Expenses:   Food Insecurity:   . Worried About Charity fundraiser in the Last Year:   . Arboriculturist in the Last Year:   Transportation Needs:   . Film/video editor (Medical):   Marland Kitchen Lack of Transportation (Non-Medical):   Physical Activity:   . Days of Exercise per Week:   . Minutes of Exercise per Session:   Stress:   . Feeling of Stress :   Social Connections:   . Frequency of Communication with Friends and Family:   . Frequency of Social Gatherings with Friends and Family:   . Attends Religious Services:   . Active Member of Clubs or Organizations:   . Attends Archivist Meetings:   Marland Kitchen Marital Status:   Intimate Partner Violence:   . Fear of Current or Ex-Partner:   . Emotionally Abused:   Marland Kitchen Physically Abused:   . Sexually Abused:       Review of systems:  All other review of systems negative except as mentioned in the HPI.   Physical Exam: Vitals:   04/13/19 1358  BP:  120/72  Pulse: 68  Temp: 98 F (36.7 C)   Body mass index is 28.75 kg/m. Gen:      No acute distress Neuro: alert and oriented x 3 Psych: normal mood and affect  Data Reviewed:  Reviewed labs, radiology imaging, old records and pertinent past GI work up   Assessment and Plan/Recommendations: 54 year old male with history of CAD s/p drug-eluting stent on chronic Plavix, CHF and recent diagnosis of Crohn's disease, steroid-dependent.  He failed prednisone taper, unable to achieve clinical remission   Discussed immunosuppressive therapy and Biologics.  He is in agreement to start immunosuppressive therapy  Start 6-MP  50 mg daily Will plan to check this CBC and LFT today for baseline, recheck in 2 weeks, then in [redacted] weeks along with amylase and lipase  Also plan to start Humira with induction dose, pending insurance approval  Continue prednisone 20 mg daily for additional 2 weeks and start tapering down by 5 mg weekly after that  TB QuantiFERON negative  Bright red blood per rectum likely secondary to small-volume hemorrhage from internal hemorrhoids, proctitis also in the differential Anusol suppository not covered by [employer] Preparation H suppository OTC daily at bedtime PRN for 7 to 10 days   The patient was provided an opportunity to ask questions and all were answered. The patient agreed with the plan and demonstrated an understanding of the instructions.  Damaris Hippo , MD    CC: [name], *

## 2019-04-13 NOTE — Patient Instructions (Addendum)
Your provider has requested that you go to the basement level for lab work before leaving today. Press "B" on the elevator. The lab is located at the first door on the left as you exit the elevator.  You will also come back to our lab in 2 weeks (around 4/21) and 6 weeks (around 5/21)  for repeat lab work. Orders will be in the system so come by our lab.   We have sent the following medications to your pharmacy for you to pick up at your convenience: mercaptopurine.  Continue prednisone 20 mg daily x 2 weeks then decrease by 5 mg every week until finished.   Purchase over the counter preparation H suppositories daily at bedtime as needed.   [name] will contact you when we have got the approval for Humira.   Due to recent changes in healthcare laws, you may see the results of your imaging and laboratory studies on MyChart before your provider has had a chance to review them.  We understand that in some cases there may be results that are confusing or concerning to you. Not all laboratory results come back in the same time frame and the provider may be waiting for multiple results in order to interpret others.  Please give [place] 48 hours in order for your provider to thoroughly review all the results before contacting the office for clarification of your results.

## 2019-04-14 ENCOUNTER — Other Ambulatory Visit: Payer: Self-pay

## 2019-04-14 MED ORDER — HUMIRA-CD/UC/HS STARTER 80 MG/0.8ML ~~LOC~~ AJKT: 80.0000 mg | AUTO-INJECTOR | SUBCUTANEOUS | 0 refills | Status: DC

## 2019-04-14 MED ORDER — HUMIRA (2 PEN) 40 MG/0.4ML ~~LOC~~ AJKT: 40.0000 mg | AUTO-INJECTOR | SUBCUTANEOUS | 11 refills | Status: DC

## 2019-04-15 LAB — QUANTIFERON-TB GOLD PLUS
Mitogen-NIL: >10 IU/mL
NIL: 0.03 IU/mL
QuantiFERON-TB Gold Plus: NEGATIVE
TB1-NIL: 0 IU/mL
TB2-NIL: 0 IU/mL

## 2019-04-15 NOTE — Telephone Encounter (Signed)
Humira approved. Confirmed with [facility] that the approval covers starter and maintaining strengths. Signed patient up for the [facility] program.  Contacted patient with this information.

## 2019-05-09 ENCOUNTER — Other Ambulatory Visit (INDEPENDENT_AMBULATORY_CARE_PROVIDER_SITE_OTHER): Payer: 59

## 2019-05-09 DIAGNOSIS — K649 Unspecified hemorrhoids: Secondary | ICD-10-CM | POA: Diagnosis not present

## 2019-05-09 DIAGNOSIS — K50811 Crohn's disease of both small and large intestine with rectal bleeding: Secondary | ICD-10-CM | POA: Diagnosis not present

## 2019-05-09 LAB — CBC WITH DIFFERENTIAL/PLATELET
Basophils Absolute: 0 10*3/uL (ref 0.0–0.1)
Basophils Relative: 0.5 % (ref 0.0–3.0)
Eosinophils Absolute: 0 10*3/uL (ref 0.0–0.7)
Eosinophils Relative: 0.6 % (ref 0.0–5.0)
HCT: 36.5 % — ABNORMAL LOW (ref 39.0–52.0)
Hemoglobin: 12.3 g/dL — ABNORMAL LOW (ref 13.0–17.0)
Lymphocytes Relative: 15.5 % (ref 12.0–46.0)
Lymphs Abs: 1.2 10*3/uL (ref 0.7–4.0)
MCHC: 33.7 g/dL (ref 30.0–36.0)
MCV: 89.2 fl (ref 78.0–100.0)
Monocytes Absolute: 0.5 10*3/uL (ref 0.1–1.0)
Monocytes Relative: 6 % (ref 3.0–12.0)
Neutro Abs: 6.2 10*3/uL (ref 1.4–7.7)
Neutrophils Relative %: 77.4 % — ABNORMAL HIGH (ref 43.0–77.0)
Platelets: 230 10*3/uL (ref 150.0–400.0)
RBC: 4.09 Mil/uL — ABNORMAL LOW (ref 4.22–5.81)
RDW: 15.4 % (ref 11.5–15.5)
WBC: 8 10*3/uL (ref 4.0–10.5)

## 2019-05-09 LAB — HEPATIC FUNCTION PANEL
ALT: 16 U/L (ref 0–53)
AST: 13 U/L (ref 0–37)
Albumin: 4.1 g/dL (ref 3.5–5.2)
Alkaline Phosphatase: 51 U/L (ref 39–117)
Bilirubin, Direct: 0.2 mg/dL (ref 0.0–0.3)
Total Bilirubin: 1.5 mg/dL — ABNORMAL HIGH (ref 0.2–1.2)
Total Protein: 6.6 g/dL (ref 6.0–8.3)

## 2019-05-09 LAB — LIPASE: Lipase: 19 U/L (ref 11.0–59.0)

## 2019-05-09 LAB — AMYLASE: Amylase: 40 U/L (ref 27–131)

## 2019-05-12 ENCOUNTER — Other Ambulatory Visit: Payer: Self-pay

## 2019-05-12 DIAGNOSIS — K50919 Crohn's disease, unspecified, with unspecified complications: Secondary | ICD-10-CM

## 2019-05-12 DIAGNOSIS — R109 Unspecified abdominal pain: Secondary | ICD-10-CM

## 2019-05-21 ENCOUNTER — Other Ambulatory Visit: Payer: Self-pay | Admitting: Cardiology

## 2019-05-23 ENCOUNTER — Other Ambulatory Visit: Payer: Self-pay | Admitting: Cardiology

## 2019-06-03 ENCOUNTER — Ambulatory Visit (INDEPENDENT_AMBULATORY_CARE_PROVIDER_SITE_OTHER): Payer: 59 | Admitting: Gastroenterology

## 2019-06-03 ENCOUNTER — Encounter: Payer: Self-pay | Admitting: Gastroenterology

## 2019-06-03 VITALS — BP 96/52 | HR 63 | Ht 70.0 in | Wt 196.6 lb

## 2019-06-03 DIAGNOSIS — Z79899 Other long term (current) drug therapy: Secondary | ICD-10-CM | POA: Diagnosis not present

## 2019-06-03 DIAGNOSIS — K5000 Crohn's disease of small intestine without complications: Secondary | ICD-10-CM | POA: Diagnosis not present

## 2019-06-03 DIAGNOSIS — Z79620 Long term (current) use of immunosuppressive biologic: Secondary | ICD-10-CM

## 2019-06-03 DIAGNOSIS — K2100 Gastro-esophageal reflux disease with esophagitis, without bleeding: Secondary | ICD-10-CM

## 2019-06-03 DIAGNOSIS — Z9225 Personal history of immunosupression therapy: Secondary | ICD-10-CM

## 2019-06-03 MED ORDER — MERCAPTOPURINE 50 MG PO TABS: 50.0000 mg | ORAL_TABLET | Freq: Every day | ORAL | 6 refills | Status: DC

## 2019-06-03 MED ORDER — PREDNISONE 10 MG PO TABS: ORAL_TABLET | ORAL | 0 refills | Status: DC

## 2019-06-03 NOTE — Patient Instructions (Signed)
We have sent the following medications to your pharmacy for you to pick up at your convenience: 6 MP and Prednisone.  [name] will call you to discuss beginning Humira.  Begin these over the counter vitamins:  Multivitamin daily, Folic acid 69m daily, [id number] daily, Ferrous gluconate 32567m twice a day.  Please follow up in three months

## 2019-06-03 NOTE — Progress Notes (Signed)
Miguel Pena    [id number]    [birth year]  Primary Care Physician:Elkins, Curt Jews, MD  Referring Physician: [name], MD [address] [place],  [place] [address]   Chief complaint: Crohn's disease HPI: 54 year old male with history of CHF, CAD on Plavix, recently diagnosed with Crohn's disease.  He completed prednisone taper.  He is currently taking 6-MP 50 mg daily.  He has not started Humira injections yet, is waiting for approval and pharmacy to ship it. He is having to drive to [place] almost 2-3 times a week, he has not been able to answer the phone calls as he is constantly on the road   He is having slightly increased bowel frequency and it is less formed in the last 1 to 2 days.  Denies any melena or blood mixed in stool.  He has intermittent bright red blood per rectum from bleeding hemorrhoids.  No dysphagia, abdominal pain, nausea or vomiting.  He feels fatigued.    Relevant GI history: EGD December 06, 2018:LA grade C esophagitis and gastritis. H. pylori negative  Colonoscopy December 06, 2018:Terminal ileum ulceration with ileitis, biopsies consistent with IBD. Diverticulosis and internal hemorrhoids otherwise normal exam.  Capsule endoscopy October 22, 2018: Multiple aphthous ulcers and inflammation throughout small bowel, predominantly in the distal small bowel consistent with Crohn's ileitis  03/10/2017 EGD:LA grade C reflux and candidiasis esophagitis, benign-appearing esophageal stenosis, gastritis with hemorrhage, duodenal erosions without bleeding otherwise normal. Started on Nexium 40 twice daily.  03/10/2017 Colonoscopy:moderate diverticulosis in the sigmoid and descending colon, narrowing of the colon in association with diverticular opening, peridiverticular erythema, 1 5 mm polyp in the sigmoid colon and nonbleeding internal hemorrhoids.  Pathology showed chronic gastritis and reflux esophagitis  negative for H. pylori or dysplasia. Polyp removed was hyperplastic. Recall colonoscopy recommended in 10 years.  CT abdomen pelvis with contrast 09/29/2018 with inflammatory changes and thickening of the distal and terminal ileum which may represent infectious enteritis or an inflammatory bowel disease. No bowel obstruction, normal appendix. Sigmoid diverticulosis and cholelithiasis.  Outpatient Encounter Medications as of 06/03/2019  Medication Sig  . bisoprolol (ZEBETA) 5 MG tablet TAKE 1 TABLET BY MOUTH EVERY DAY  . dicyclomine (BENTYL) 10 MG capsule Take 1 capsule (10 mg total) by mouth 3 (three) times daily. Take at noon, 4pm & evening (Patient taking differently: Take 10 mg by mouth daily. )  . furosemide (LASIX) 40 MG tablet Take 1 tablet (40 mg total) by mouth as needed for edema. (Patient taking differently: Take 40 mg by mouth daily as needed for edema. )  . Homeopathic Products (ZICAM ALLERGY RELIEF NA) Place 1 spray into the nose daily as needed (allergies).  Marland Kitchen HUMIRA PEN 40 MG/0.4ML PNKT Inject 40 mg into the skin every 14 (fourteen) days. Begin on day 29 then every 14 days  . HUMIRA PEN-CD/UC/HS STARTER 80 MG/0.8ML PNKT Inject 80 mg into the skin as directed. 2 syringes day 1 then 1 syringe day 15 then begin new dose day 29  . HYDROcodone-acetaminophen (NORCO) 10-325 MG tablet Take 1 tablet by mouth every 4 (four) hours as needed for moderate pain.   . hydrocortisone (ANUSOL-HC) 2.5 % rectal cream Place 1 application rectally 2 (two) times daily.  Marland Kitchen loratadine (CLARITIN) 10 MG tablet Take 10 mg by mouth daily as needed for allergies.  Marland Kitchen mercaptopurine (PURINETHOL) 50 MG tablet Take 1 tablet (50 mg total) by mouth daily. Give on  an empty stomach 1 hour before or 2 hours after meals. Caution: Chemotherapy.  . Multiple Vitamins-Minerals (HAIR/SKIN/NAILS) CAPS Take 2 capsules by mouth daily.  . nicotine polacrilex (NICORETTE) 4 MG gum Take 4 mg by mouth as needed for smoking  cessation.  Marland Kitchen omeprazole (PRILOSEC OTC) 20 MG tablet Take 20 mg by mouth 2 (two) times daily.  . potassium chloride SA (K-DUR) 20 MEQ tablet TAKE 1 TABLET BY MOUTH DAILY (Patient taking differently: Take 20 mEq by mouth every evening. )  . predniSONE (DELTASONE) 20 MG tablet Take 1 tablet (20 mg total) by mouth daily with breakfast. Patient is to take 40 mg daily for 2 weeks then taper by 5 mg weekly (Patient taking differently: Take 20 mg by mouth daily with breakfast. )  . predniSONE (DELTASONE) 5 MG tablet Take 1 tablet (5 mg total) by mouth daily with breakfast. Should be doing a 5 mg taper as discussed in the office, needs office appointment  . rosuvastatin (CRESTOR) 40 MG tablet Take 1 tablet (40 mg total) by mouth daily.  . sacubitril-valsartan (ENTRESTO) 97-103 MG Take 1 tablet by mouth 2 (two) times daily.  Marland Kitchen spironolactone (ALDACTONE) 25 MG tablet TAKE 1 TABLET BY MOUTH EVERY DAY   No facility-administered encounter medications on file as of 06/03/2019.    Allergies as of 06/03/2019  . (No Known Allergies)    Past Medical History:  Diagnosis Date  . Bursitis of right shoulder   . CHF (congestive heart failure) ([facility])    [name] 10/24/2016  . Chronic combined systolic and diastolic heart failure ([facility]) 03/16/2017  . Dilated cardiomyopathy ([facility])    [name] 10/24/2016  . Dysrhythmia 1995   "VT" per patient- states had neg stress test and no problems since  . Encounter for adjustment or management of automatic implantable cardioverter-defibrillator 06/17/2018  . Encounter for assessment of automatic implantable cardioverter-defibrillator (AICD) 06/17/2018  . Heart murmur    "when I was young"  . History of kidney stones   . Hypercholesterolemia   . ICD: Medtronic MRI Quad CRTD (Bi-V ICD) implantation 03/16/2017 [name] 03/16/2017   Scheduled Remote ICD check   8.7.20: No VHR episodes. No mode switches. Normal health trends. Trans-thoracic impedance trends and the OptiVol Fluid Index do  no present significant abnormalities. Battery longevity is 6.5-8.3 years. RA pacing is 8.3 %, RV pacing is 99.9 %, and LV pacing is 99.9 %.  Clinic: 05/18/17  . NICM (nonischemic cardiomyopathy) ([facility]) 03/16/2017    Past Surgical History:  Procedure Laterality Date  . BIOPSY  12/06/2018   Procedure: BIOPSY;  Surgeon: Mauri Pole, MD;  Location: [facility];  Service: Endoscopy;;  Egd and Colon  . BIV ICD INSERTION CRT-D N/A 03/16/2017   Procedure: BIV ICD INSERTION CRT-D;  Surgeon: Deboraha Sprang, MD;  Location: [facility];  Service: Cardiovascular;  Laterality: N/A;  . COLONOSCOPY WITH PROPOFOL N/A 03/10/2017   Procedure: COLONOSCOPY WITH PROPOFOL;  Surgeon: Mauri Pole, MD;  Location: [facility];  Service: Endoscopy;  Laterality: N/A;  . COLONOSCOPY WITH PROPOFOL N/A 12/06/2018   Procedure: COLONOSCOPY WITH PROPOFOL;  Surgeon: Mauri Pole, MD;  Location: [facility];  Service: Endoscopy;  Laterality: N/A;  . CORONARY ANGIOPLASTY WITH STENT PLACEMENT  10/28/2016  . CORONARY STENT INTERVENTION N/A 10/28/2016   Procedure: CORONARY STENT INTERVENTION;  Surgeon: [name], MD;  Location: [facility];  Service: Cardiovascular;  Laterality: N/A;  . ESOPHAGOGASTRODUODENOSCOPY (EGD) WITH PROPOFOL N/A 03/10/2017  Procedure: ESOPHAGOGASTRODUODENOSCOPY (EGD) WITH PROPOFOL;  Surgeon: Mauri Pole, MD;  [facility];  Service: Endoscopy;  Laterality: N/A;  . ESOPHAGOGASTRODUODENOSCOPY (EGD) WITH PROPOFOL N/A 12/06/2018   Procedure: ESOPHAGOGASTRODUODENOSCOPY (EGD) WITH PROPOFOL;  Surgeon: Mauri Pole, MD;  Location: [facility];  Service: Endoscopy;  Laterality: N/A;  . INGUINAL HERNIA REPAIR Right 02/16/2013   Procedure: RIGHT INGUINAL HERNIA REPAIR ;  Surgeon: [name], MD;  Location: [facility];  Service: General;  Laterality: Right;  With MESH  . INGUINAL HERNIA REPAIR Right 02/16/2013  . INTRAVASCULAR PRESSURE WIRE/FFR STUDY  N/A 10/28/2016   Procedure: INTRAVASCULAR PRESSURE WIRE/FFR STUDY;  Surgeon: [name], MD;  Location: [facility];  Service: Cardiovascular;  Laterality: N/A;  . RIGHT/LEFT HEART CATH AND CORONARY ANGIOGRAPHY N/A 10/28/2016   Procedure: RIGHT/LEFT HEART CATH AND CORONARY ANGIOGRAPHY;  Surgeon: [name], MD;  Location: [facility];  Service: Cardiovascular;  Laterality: N/A;  . ULTRASOUND GUIDANCE FOR VASCULAR ACCESS  10/28/2016   Procedure: Ultrasound Guidance For Vascular Access;  Surgeon: [name], MD;  Location: [facility];  Service: Cardiovascular;;    Family History  Problem Relation Age of Onset  . Heart attack Father   . Colon polyps Father   . Colon polyps Brother     Social History   Socioeconomic History  . Marital status: Married    Spouse name: Not on file  . Number of children: 2  . Years of education: Not on file  . Highest education level: Not on file  Occupational History  . Occupation: Emergency planning/management officer    Comment: [employer]  Tobacco Use  . Smoking status: Former Smoker    Packs/day: 1.00    Years: 12.00    Pack years: 12.00    Types: Cigarettes, Cigars    Quit date: 02/16/2011    Years since quitting: 8.2  . Smokeless tobacco: Former Systems developer    Types: Snuff  . Tobacco comment: 10/28/2016 'chewed for a couple years after I quit smoking"  Substance and Sexual Activity  . Alcohol use: No    Comment: none since 9/11  . Drug use: No  . Sexual activity: Not Currently  Other Topics Concern  . Not on file  Social History Narrative  . Not on file   Social Determinants of Health   Financial Resource Strain:   . Difficulty of Paying Living Expenses:   Food Insecurity:   . Worried About Charity fundraiser in the Last Year:   . Arboriculturist in the Last Year:   Transportation Needs:   . Film/video editor (Medical):   Marland Kitchen Lack of Transportation (Non-Medical):   Physical Activity:   . Days of  Exercise per Week:   . Minutes of Exercise per Session:   Stress:   . Feeling of Stress :   Social Connections:   . Frequency of Communication with Friends and Family:   . Frequency of Social Gatherings with Friends and Family:   . Attends Religious Services:   . Active Member of Clubs or Organizations:   . Attends Archivist Meetings:   Marland Kitchen Marital Status:   Intimate Partner Violence:   . Fear of Current or Ex-Partner:   . Emotionally Abused:   Marland Kitchen Physically Abused:   . Sexually Abused:       Review of systems: All other review of systems negative except as mentioned in the HPI.   Physical Exam:  Vitals:   06/03/19 1329  BP: (!) 96/52  Pulse: 63  SpO2: 98%   Body mass index is 28.21 kg/m. Gen:      No acute distress Neuro: alert and oriented x 3 Psych: normal mood and affect  Data Reviewed:  Reviewed labs, radiology imaging, old records and pertinent past GI work up   Assessment and Plan/Recommendations:  54 year old male with history of CAD s/p drug-eluting stent on chronic Plavix, CHF and recent diagnosis of Crohn's disease, steroid-dependent.  He failed prednisone taper, unable to achieve clinical remission   Started on 6-MP 50 mg daily and awaiting shipment for Humira to start therapy  He is tolerating 6-MP well so far.  LFT and CBC stable Continue 6-MP 50 mg daily  We will recheck CBC, LFT and lipase in 3 months  Start Humira as soon as he receives it with induction dose.  Will send prescription for prednisone 10 mg tablets X30, in case there is further delay in the shipment of Humira and he starts developing symptoms of Crohn's flare Advised patient to take 20 mg daily for 10 days followed by 10 mg daily for 10 days if needed, also advised him to inform us before he starts taking the prednisone  GERD: Continue omeprazole 20 mg daily and antireflux measures  Symptomatic hemorrhoids with intermittent bright red blood per rectum: Preparation  H suppository per rectum daily as needed Avoid excessive straining during defecation  Take 1 multivitamin daily along with vitamin B12 2080 mcg daily, folic acid 1 mg daily and ferrous gluconate 325 mg twice daily with meals  Return in 3 months or sooner if needed   The patient was provided an opportunity to ask questions and all were answered. The patient agreed with the plan and demonstrated an understanding of the instructions.  Damaris Hippo , MD    CC: [name], *

## 2019-07-18 IMAGING — DX DG CHEST 2V
2 series · 2 of 2 positions shown · non-contrast
Comparison: 09/22/2016

CLINICAL DATA: New onset CHF.

EXAM:
CHEST  2 VIEW

[chest pa]
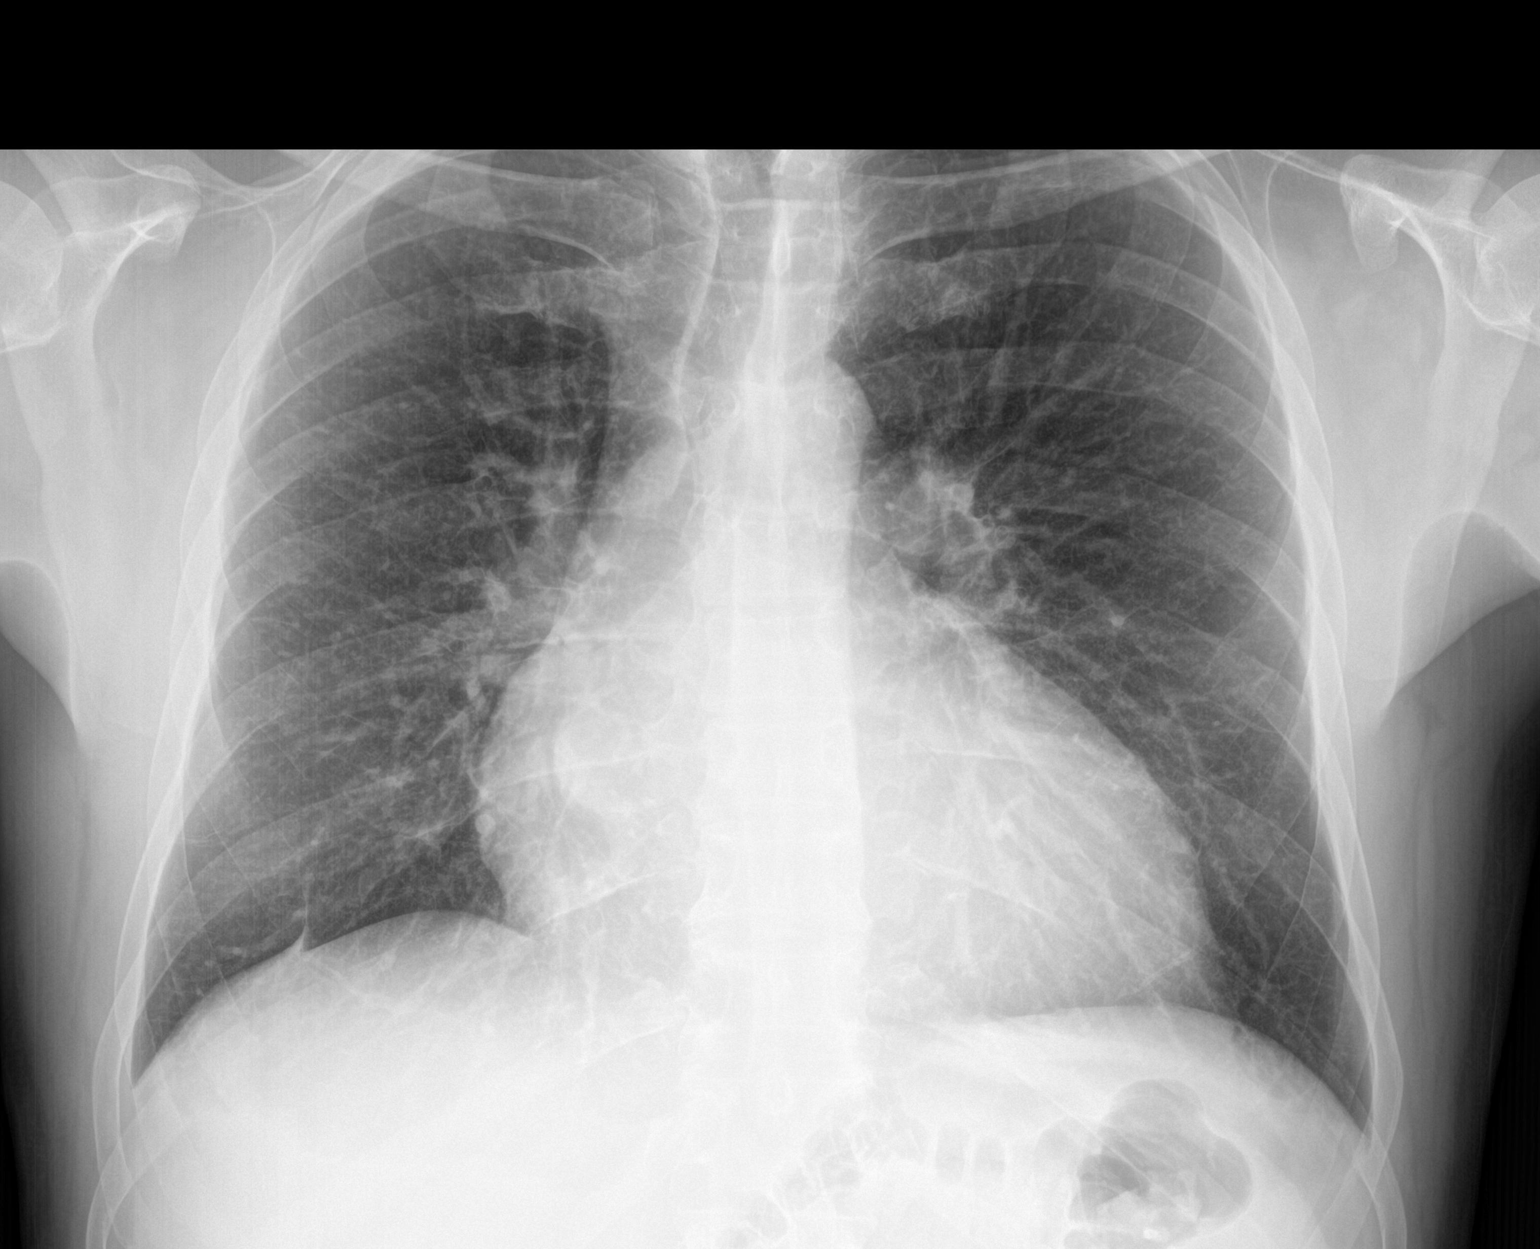

[chest lat]
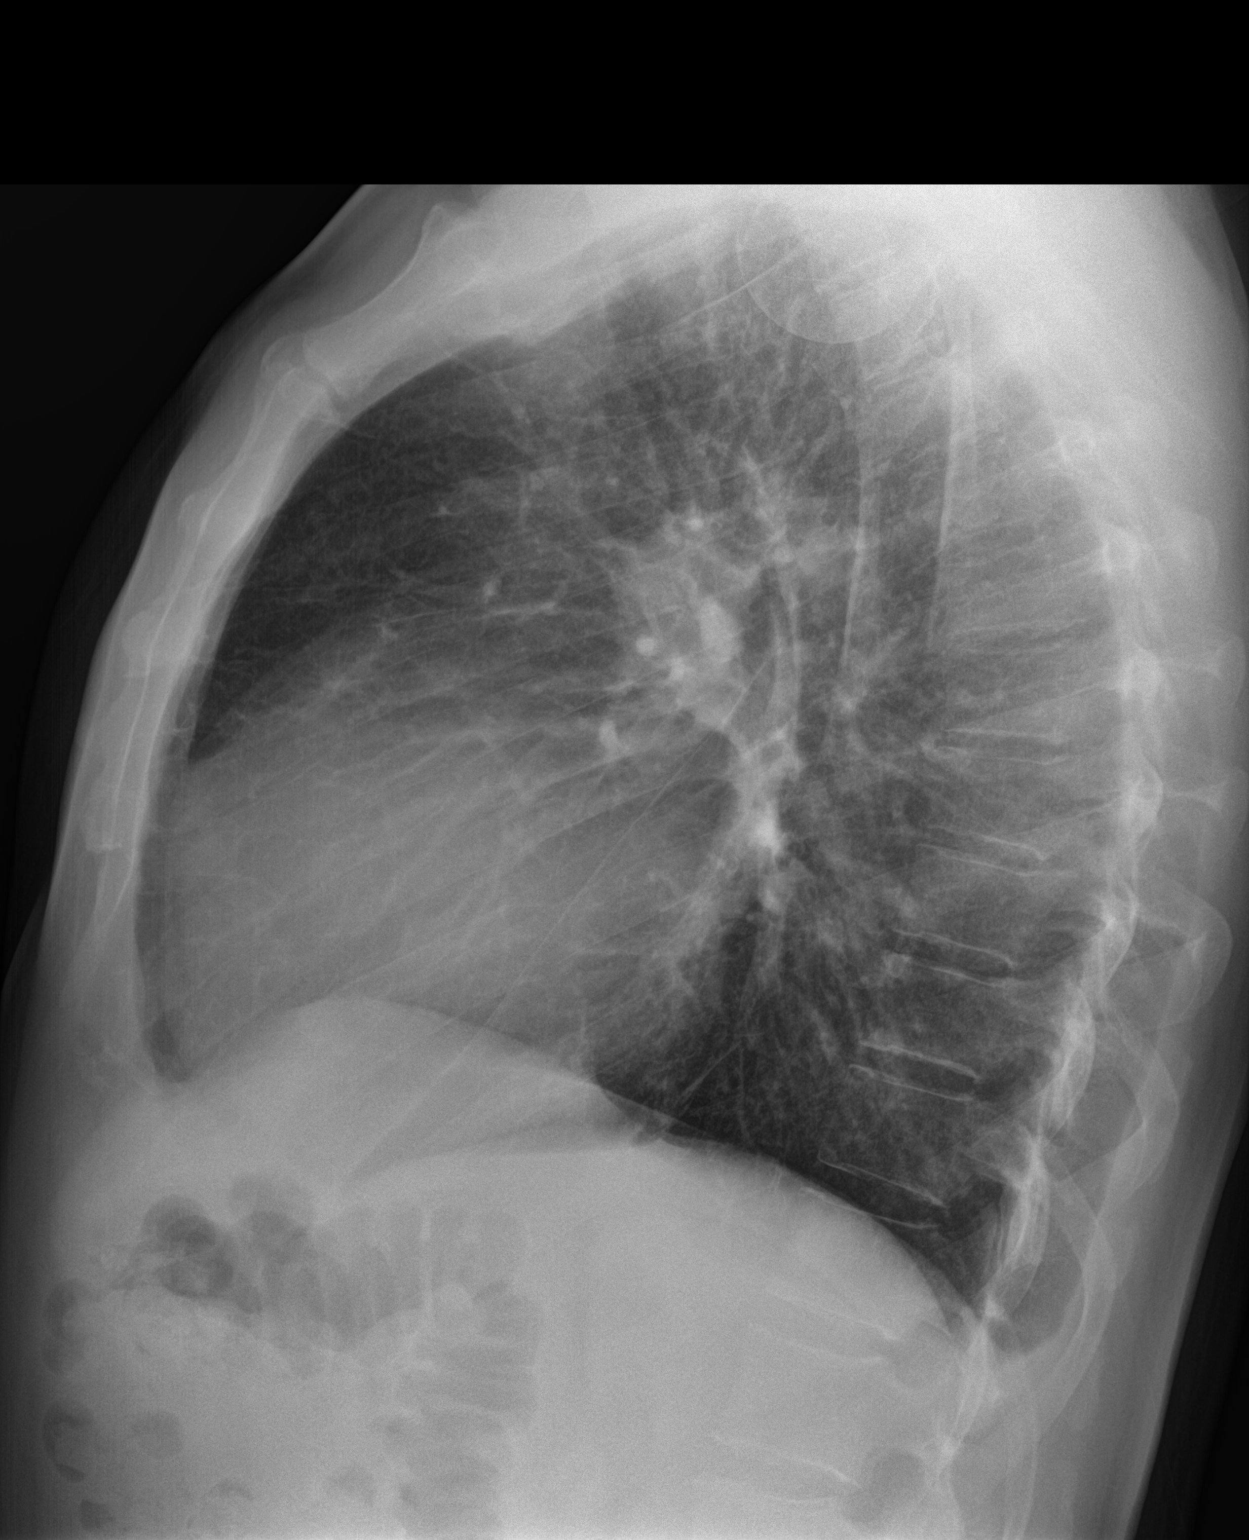

[2 of 2 positions shown; findings below may reference images not displayed]

FINDINGS: There is mild bilateral interstitial thickening. There is no focal
parenchymal opacity. There is no pleural effusion or pneumothorax.
There is stable cardiomegaly.

The osseous structures are unremarkable.
IMPRESSION: Cardiomegaly with mild pulmonary vascular congestion.

## 2019-07-20 ENCOUNTER — Telehealth: Payer: Self-pay

## 2019-07-20 ENCOUNTER — Other Ambulatory Visit: Payer: Self-pay | Admitting: Gastroenterology

## 2019-07-20 ENCOUNTER — Other Ambulatory Visit: Payer: Self-pay

## 2019-07-20 MED ORDER — HUMIRA (2 PEN) 40 MG/0.4ML ~~LOC~~ AJKT: 40.0000 mg | AUTO-INJECTOR | SUBCUTANEOUS | 11 refills | Status: DC

## 2019-07-20 MED ORDER — HUMIRA-CD/UC/HS STARTER 80 MG/0.8ML ~~LOC~~ AJKT: 80.0000 mg | AUTO-INJECTOR | SUBCUTANEOUS | 0 refills | Status: DC

## 2019-07-20 NOTE — Telephone Encounter (Signed)
Spoke with patient and scheduled his 3 month follow up with Dr. Silverio Decamp for 09/28/19.  Patient also had questions about his Humira.  Miguel Pena is going to call him to address this.

## 2019-07-20 NOTE — Telephone Encounter (Signed)
-----   Message from Audrea Muscat, [place] sent at 06/03/2019  2:17 PM EDT ----- Office visit for end of 08/2019

## 2019-07-20 NOTE — Telephone Encounter (Signed)
Spoke with the patient. He has the co-pay card from the [facility] to lower his part of the co-pay for Humira. He has not heard from [facility] about shipment of the medication. The patient answers all the calls that come to his phone. Resent the prescriptions for the starter kit and the maintenance pen kit to [facility].  Patient agrees to call me if he has not heard from the speciality pharmacy in a week.

## 2019-07-21 NOTE — Telephone Encounter (Signed)
Confirmed again the Humira starter kit and the Humira maintenance pen kit were approved in May. Faxed again, a prescription containing the diagnosis code to [facility].

## 2019-07-21 NOTE — Telephone Encounter (Signed)
Dr Silverio Decamp Can this patient have prednisone refills? Looks like in May he completed a prednisone taper

## 2019-07-26 ENCOUNTER — Other Ambulatory Visit: Payer: Self-pay

## 2019-07-26 MED ORDER — HUMIRA-CD/UC/HS STARTER 80 MG/0.8ML ~~LOC~~ AJKT: 80.0000 mg | AUTO-INJECTOR | SUBCUTANEOUS | 0 refills | Status: DC

## 2019-07-26 MED ORDER — HUMIRA (2 PEN) 40 MG/0.4ML ~~LOC~~ AJKT: 40.0000 mg | AUTO-INJECTOR | SUBCUTANEOUS | 11 refills | Status: DC

## 2019-07-26 NOTE — Telephone Encounter (Signed)
Patient has not been contacted by [facility]. I went on line and enrolled the patient with [facility]. I have submitted the prescriptions to [facility] electronically.

## 2019-07-28 NOTE — Telephone Encounter (Signed)
He is scheduled for 09/28/19, 3 month follow up. Please confirm you need to see him sooner. Thanks

## 2019-07-28 NOTE — Telephone Encounter (Signed)
Spoke with the patient. States [employer] has called him and left a message. Encouraged him to return the call. They will need the information from the co-pay card, the actual co-pay and to arrange shipment. He does agree to complete this process.  If he will follow through, the Humira should be delivered so he can begin it.

## 2019-07-28 NOTE — Telephone Encounter (Signed)
Okay, please bring him in for follow-up office visit next available appointment with me or app in 3 to 4 weeks.  Thanks

## 2019-07-28 NOTE — Telephone Encounter (Signed)
Yes, please bring him in August if I have any cancellations or availability. Thanks

## 2019-07-28 NOTE — Telephone Encounter (Signed)
Okay to send refill for prednisone.  He has not started Humira yet. Please advise patient to take 20 mg daily for 1 week followed by 15 mg daily for 1 week, 10 mg daily for 1 week and then 5 mg daily for 2 weeks.

## 2019-07-29 NOTE — Telephone Encounter (Signed)
Sent prednisone taper to patients pharmacy

## 2019-08-08 NOTE — Telephone Encounter (Signed)
Spoke with the patient. He has not had an opportunity to call [facility] back yet. He has been super busy. He will try to do this today. I explained they probably need to set up a shipment date for the Humira because it has to be refrigerated. He must make the arrangement.

## 2019-09-06 ENCOUNTER — Other Ambulatory Visit: Payer: Self-pay | Admitting: Cardiology

## 2019-09-28 ENCOUNTER — Ambulatory Visit: Payer: 59 | Admitting: Gastroenterology

## 2019-10-31 ENCOUNTER — Ambulatory Visit: Payer: 59 | Admitting: Cardiology

## 2019-11-03 ENCOUNTER — Encounter: Payer: Self-pay | Admitting: Cardiology

## 2019-11-03 ENCOUNTER — Ambulatory Visit: Payer: 59 | Admitting: Cardiology

## 2019-11-03 ENCOUNTER — Other Ambulatory Visit: Payer: Self-pay

## 2019-11-03 VITALS — BP 106/62 | HR 58 | Resp 16 | Ht 70.0 in | Wt 190.0 lb

## 2019-11-03 DIAGNOSIS — Z4502 Encounter for adjustment and management of automatic implantable cardiac defibrillator: Secondary | ICD-10-CM

## 2019-11-03 DIAGNOSIS — I472 Ventricular tachycardia: Secondary | ICD-10-CM

## 2019-11-03 DIAGNOSIS — I4729 Other ventricular tachycardia: Secondary | ICD-10-CM

## 2019-11-03 DIAGNOSIS — I428 Other cardiomyopathies: Secondary | ICD-10-CM

## 2019-11-03 DIAGNOSIS — I2510 Atherosclerotic heart disease of native coronary artery without angina pectoris: Secondary | ICD-10-CM

## 2019-11-03 DIAGNOSIS — Z95810 Presence of automatic (implantable) cardiac defibrillator: Secondary | ICD-10-CM

## 2019-11-03 DIAGNOSIS — E782 Mixed hyperlipidemia: Secondary | ICD-10-CM

## 2019-11-03 NOTE — Progress Notes (Addendum)
Subjective:   Miguel Pena, male    DOB: 30-Jul-1965, 54 y.o.   MRN: [id number]  Chief Complaint  Patient presents with  . Congestive Heart Failure    1 year f/u  . Pacemaker Check    Medtronic ICD check      HPI  Miguel Pena is a 54 y.o. Caucasian male with nonischemic dilated cardiomyopathy with now mormalization of LVEF post CRT-D placement 03/2017, single vessel CAD s/p successful PCI to OM1 with 3.5 X 16 mm DES 10/2016, hyperlipidemia, prior smoking history, testosterone deficiency.  He has been doing well. He does not have any exertional chest pain, shortness of breath.  He has not taken Covid vaccine, but is prepared to have vaccination done and wanted to discuss with me.  He has been diagnosed with Crohn's disease and is seeing a gastroenterologist and continue to have bloating but symptoms have improved. He does report having occasional bleeding from his hemorrhoids.  Past Medical History:  Diagnosis Date  . Bursitis of right shoulder   . CHF (congestive heart failure) ([place])    [name] 10/24/2016  . Chronic combined systolic and diastolic heart failure ([place]) 03/16/2017  . Dilated cardiomyopathy ([place])    [name] 10/24/2016  . Dysrhythmia 1995   "VT" per patient- states had neg stress test and no problems since  . Encounter for adjustment or management of automatic implantable cardioverter-defibrillator 06/17/2018  . Encounter for assessment of automatic implantable cardioverter-defibrillator (AICD) 06/17/2018  . Heart murmur    "when I was young"  . History of kidney stones   . Hypercholesterolemia   . ICD: Medtronic MRI Quad CRTD (Bi-V ICD) implantation 03/16/2017 [name] 03/16/2017   Scheduled Remote ICD check   8.7.20: No VHR episodes. No mode switches. Normal health trends. Trans-thoracic impedance trends and the OptiVol Fluid Index do no present significant abnormalities. Battery longevity is 6.5-8.3 years. RA pacing is 8.3 %, RV pacing is 99.9 %, and LV pacing is 99.9  %.  Clinic: 05/18/17  . NICM (nonischemic cardiomyopathy) ([place]) 03/16/2017   Past Surgical History:  Procedure Laterality Date  . BIOPSY  12/06/2018   Procedure: BIOPSY;  Surgeon: Mauri Pole, MD;  Location: [facility];  Service: Endoscopy;;  Egd and Colon  . BIV ICD INSERTION CRT-D N/A 03/16/2017   Procedure: BIV ICD INSERTION CRT-D;  Surgeon: Deboraha Sprang, MD;  Location: [facility];  Service: Cardiovascular;  Laterality: N/A;  . COLONOSCOPY WITH PROPOFOL N/A 03/10/2017   Procedure: COLONOSCOPY WITH PROPOFOL;  Surgeon: Mauri Pole, MD;  Location: [facility];  Service: Endoscopy;  Laterality: N/A;  . COLONOSCOPY WITH PROPOFOL N/A 12/06/2018   Procedure: COLONOSCOPY WITH PROPOFOL;  Surgeon: Mauri Pole, MD;  Location: [facility];  Service: Endoscopy;  Laterality: N/A;  . CORONARY ANGIOPLASTY WITH STENT PLACEMENT  10/28/2016  . CORONARY STENT INTERVENTION N/A 10/28/2016   Procedure: CORONARY STENT INTERVENTION;  Surgeon: [name], MD;  Location: [facility];  Service: Cardiovascular;  Laterality: N/A;  . ESOPHAGOGASTRODUODENOSCOPY (EGD) WITH PROPOFOL N/A 03/10/2017   Procedure: ESOPHAGOGASTRODUODENOSCOPY (EGD) WITH PROPOFOL;  Surgeon: Mauri Pole, MD;  Location: [facility];  Service: Endoscopy;  Laterality: N/A;  . ESOPHAGOGASTRODUODENOSCOPY (EGD) WITH PROPOFOL N/A 12/06/2018   Procedure: ESOPHAGOGASTRODUODENOSCOPY (EGD) WITH PROPOFOL;  Surgeon: Mauri Pole, MD;  Location: [facility];  Service: Endoscopy;  Laterality: N/A;  . INGUINAL HERNIA REPAIR Right 02/16/2013   Procedure: RIGHT INGUINAL HERNIA REPAIR ;  Surgeon: [name],  MD;  Location: [facility];  Service: General;  Laterality: Right;  With MESH  . INGUINAL HERNIA REPAIR Right 02/16/2013  . INTRAVASCULAR PRESSURE WIRE/FFR STUDY N/A 10/28/2016   Procedure: INTRAVASCULAR PRESSURE WIRE/FFR STUDY;  Surgeon: [name], MD;  Location: [facility];   Service: Cardiovascular;  Laterality: N/A;  . RIGHT/LEFT HEART CATH AND CORONARY ANGIOGRAPHY N/A 10/28/2016   Procedure: RIGHT/LEFT HEART CATH AND CORONARY ANGIOGRAPHY;  Surgeon: [name], MD;  Location: [facility];  Service: Cardiovascular;  Laterality: N/A;  . ULTRASOUND GUIDANCE FOR VASCULAR ACCESS  10/28/2016   Procedure: Ultrasound Guidance For Vascular Access;  Surgeon: [name], MD;  Location: [facility];  Service: Cardiovascular;;   Social History   Tobacco Use  . Smoking status: Former Smoker    Packs/day: 1.00    Years: 12.00    Pack years: 12.00    Types: Cigarettes, Cigars    Quit date: 02/16/2011    Years since quitting: 8.7  . Smokeless tobacco: Former Systems developer    Types: Snuff  . Tobacco comment: 10/28/2016 'chewed for a couple years after I quit smoking"  Substance Use Topics  . Alcohol use: No    Comment: none since 9/11  Marital Status: Married  Family History  Problem Relation Age of Onset  . Heart attack Father   . Colon polyps Father   . Colon polyps Brother    Medications:   No Known Allergies   Current Outpatient Medications on File Prior to Visit  Medication Sig Dispense Refill  . bisoprolol (ZEBETA) 5 MG tablet TAKE 1 TABLET BY MOUTH EVERY DAY 90 tablet 3  . dicyclomine (BENTYL) 10 MG capsule Take 1 capsule (10 mg total) by mouth 3 (three) times daily. Take at noon, 4pm & evening (Patient taking differently: Take 10 mg by mouth daily. ) 90 capsule 3  . diphenhydrAMINE (BENADRYL ALLERGY) 25 MG tablet Take 25 mg by mouth every 6 (six) hours as needed.    Marland Kitchen esomeprazole (NEXIUM) 20 MG capsule Take 20 mg by mouth daily at 12 noon.    . furosemide (LASIX) 40 MG tablet Take 1 tablet (40 mg total) by mouth as needed for edema. (Patient taking differently: Take 40 mg by mouth daily as needed for edema. ) 30 tablet 0  . Homeopathic Products (ZICAM ALLERGY RELIEF NA) Place 1 spray into the nose daily as needed (allergies).      Marland Kitchen HUMIRA PEN 40 MG/0.4ML PNKT Inject 40 mg into the skin every 14 (fourteen) days. Begin on day 29 then every 14 days maintenance dose Dx K50.00 2 each 11  . HUMIRA PEN-CD/UC/HS STARTER 80 MG/0.8ML PNKT Inject 80 mg into the skin as directed. 2 syringes day 1 then 1 syringe day 15 then begin new dose day 29 Dx Crohn's K50.00 Starter dose 3 each 0  . HYDROcodone-acetaminophen (NORCO) 10-325 MG tablet Take 1 tablet by mouth every 4 (four) hours as needed for moderate pain.     Marland Kitchen mercaptopurine (PURINETHOL) 50 MG tablet Take 1 tablet (50 mg total) by mouth daily. Give on an empty stomach 1 hour before or 2 hours after meals. Caution: Chemotherapy. 30 tablet 6  . Multiple Vitamins-Minerals (HAIR/SKIN/NAILS) CAPS Take 2 capsules by mouth daily.    . nicotine polacrilex (NICORETTE) 4 MG gum Take 4 mg by mouth as needed for smoking cessation.    . potassium chloride SA (KLOR-CON) 20 MEQ tablet TAKE 1 TABLET BY MOUTH DAILY 90 tablet  0  . rosuvastatin (CRESTOR) 40 MG tablet Take 1 tablet (40 mg total) by mouth daily. 90 tablet 3  . sacubitril-valsartan (ENTRESTO) 97-103 MG Take 1 tablet by mouth 2 (two) times daily. 180 tablet 3  . spironolactone (ALDACTONE) 25 MG tablet TAKE 1 TABLET BY MOUTH EVERY DAY 90 tablet 1   No current facility-administered medications on file prior to visit.   Recent labs:  CMP Latest Ref Rng & Units 05/09/2019 04/13/2019 01/05/2019  Glucose 65 - 99 mg/dL - - 114(H)  BUN 6 - 24 mg/dL - - 10  Creatinine 0.76 - 1.27 mg/dL - - 0.92  Sodium 134 - 144 mmol/L - - 141  Potassium 3.5 - 5.2 mmol/L - - 4.3  Chloride 96 - 106 mmol/L - - 103  CO2 20 - 29 mmol/L - - 23  Calcium 8.7 - 10.2 mg/dL - - 10.0  Total Protein 6.0 - 8.3 g/dL 6.6 6.8 -  Total Bilirubin 0.2 - 1.2 mg/dL 1.5(H) 1.2 -  Alkaline Phos 39 - 117 U/L 51 53 -  AST 0 - 37 U/L 13 17 -  ALT 0 - 53 U/L 16 28 -   CBC Latest Ref Rng & Units 05/09/2019 04/13/2019 09/30/2018  WBC 4.0 - 10.5 K/uL 8.0 8.0 6.1  Hemoglobin 13.0 - 17.0  g/dL 12.3(L) 12.6(L) 11.4(L)  Hematocrit 39 - 52 % 36.5(L) 36.0(L) 34.5(L)  Platelets 150 - 400 K/uL 230.0 236.0 225   Lipid Panel Recent Labs    01/05/19 1006 11/15/19 1327  CHOL 139 95*  TRIG 236* 161*  LDLCALC 52 32  HDL 50 36*  CHOLHDL 2.8  --     HEMOGLOBIN A1C Lab Results  Component Value Date   HGBA1C 5.3 09/25/2016   TSH No results for input(s): TSH in the last 8760 hours.   Review of Systems  Cardiovascular: Negative for chest pain, dyspnea on exertion and leg swelling.  Gastrointestinal: Positive for bloating (chronic). Negative for melena.   Objective:   Vitals with BMI 11/03/2019 06/03/2019 04/13/2019  Height 5' 10"  5' 10"  5' 10"   Weight 190 lbs 196 lbs 10 oz 200 lbs 6 oz  BMI 27.26 93.73 42.87  Systolic 681 96 157  Diastolic 62 52 72  Pulse 58 63 68    Blood pressure 106/62, pulse (!) 58, resp. rate 16, height 5' 10"  (1.778 m), weight 190 lb (86.2 kg), SpO2 98 %.  Physical Exam Vitals and nursing note reviewed.  Constitutional:      General: He is not in acute distress.    Appearance: He is well-developed.  HENT:     Head: Normocephalic and atraumatic.  Eyes:     Conjunctiva/sclera: Conjunctivae normal.     Pupils: Pupils are equal, round, and reactive to light.  Neck:     Vascular: No JVD.  Cardiovascular:     Rate and Rhythm: Normal rate and regular rhythm.     Pulses: Intact distal pulses.     Heart sounds: No murmur heard.   Pulmonary:     Effort: Pulmonary effort is normal.     Breath sounds: Normal breath sounds. No wheezing or rales.  Abdominal:     General: Bowel sounds are normal.     Palpations: Abdomen is soft.     Tenderness: There is no rebound.  Lymphadenopathy:     Cervical: No cervical adenopathy.  Skin:    General: Skin is warm and dry.  Neurological:     Mental Status: He is alert  and oriented to person, place, and time.     Cranial Nerves: No cranial nerve deficit.    Cardiovascular studies:   Coronary angiogram  10/28/2016 NLeft/right heart cath, coronary angiography and angioplasty  Nonischemic cardiomyopathy with single vessel obstructive disease Proximal OM1 3 5 x 16 mm Promus Premier drug-eluting stent.  Echocardiogram 09/22/2017: Left ventricle cavity is normal in size. Mild concentric hypertrophy of the left ventricle. Normal global wall motion. Normal diastolic filling pattern. Calculated EF 55%. Left atrial cavity is moderately dilated at 4.8 cm. Right ventricle cavity is normal in size. Normal right ventricular function. Pacemaker lead/ICD lead noted in the RV. Trace aortic regurgitation. Borderline prolapse of the posterior > anterior MV leaflet with resultant Moderate anteriorly directed mitral regurgitation. Mild tricuspid regurgitation. No evidence of pulmonary hypertension. Compared to the study done on 02/06/2017, EF is improved from 20%, moderately severe MR is now moderate at most.  Device Clinic: Medtronic MRI Quad CRTD (Bi-V ICD) implantation 03/16/2017   Scheduled  In office ICD 11/03/19  Single (S)/Dual (D)/BV (M) BV Presenting ASBP Pacer dependant: No. Underlying SB @ 55/min. AP 6%, BP 98.6.% AMS Episodes 0.   HVR 1. Longest Short 1 S.  Longevity 4.5 Years/Voltage. Charge time 3.7Sec. Lead measurements: Stable Histogram: Low (L)/normal (N)/high (H)  Normal Patient activity Good. Thoracic impedance: No fluid ovrerload  Observations: Normal BV ICD.  Changes: None   EKG:    EKG 12/20/2018: Sinus rhythm 63 bpm.  Biventricular paced rhtyhm.  Assessment:     ICD-10-CM   1. Encounter for adjustment or management of automatic implantable cardioverter-defibrillator  Z45.02   2. ICD: Medtronic MRI Quad CRTD (Bi-V ICD) implantation 03/16/2017 [name]  Z95.810   3. NSVT (nonsustained ventricular tachycardia) ([facility])  I47.2 PCV ECHOCARDIOGRAM COMPLETE  4. NICM (nonischemic cardiomyopathy) ([place])  I42.8 PCV ECHOCARDIOGRAM COMPLETE  5. Coronary artery disease involving native  coronary artery of native heart without angina pectoris  I25.10 icosapent Ethyl (VASCEPA) 1 g capsule    icosapent Ethyl (VASCEPA) 1 g capsule  6. Mixed hyperlipidemia  E78.2 Lipid Panel With LDL/HDL Ratio    icosapent Ethyl (VASCEPA) 1 g capsule    icosapent Ethyl (VASCEPA) 1 g capsule    Plan:   Miguel Pena is a 54 y.o. Caucasian male with nonischemic dilated cardiomyopathy with now mormalization of LVEF post CRT-D placement 03/2017, single vessel CAD s/p successful PCI to OM1 with 3.5 X 16 mm DES 10/2016, hyperlipidemia, prior smoking history, testosterone deficiency.  He has been doing well. He does not have any exertional chest pain, shortness of breath.  He has not taken Covid vaccine, but is prepared to have vaccination done and wanted to discuss with me.  I have encouraged him to get vaccinated.  I reviewed his ICD data with the patient.  Although about a month ago he had excess fluid, and called him and he stated he was asymptomatic.  Advised him to continue to be careful with salt intake and to contact us immediately if he were to develop any dyspnea.  He has not had any sustained VT or therapy.  I reviewed his external labs, continues to have mixed hyperlipidemia and triglycerides are not well controlled.  We discussed regarding making lifestyle changes but would like to repeat his lipids, will consider addition of Vascepa if triglycerides still elevated in view of underlying coronary artery disease.  I will see him back again in a year as he has done well and has remained well compensated.  I will repeat echocardiogram to reevaluate his LV systolic function.  Suspect biventricular pacing is significantly improved his LVEF.   [name], MD, [facility] 11/16/2019, 6:43 AM Office: [phone] Pager: [phone]

## 2019-11-03 NOTE — H&P (View-Only) (Signed)
Subjective:   Miguel Pena, male    DOB: 1965-12-31, 54 y.o.   MRN: [id number]  Chief Complaint  Patient presents with  . Congestive Heart Failure    1 year f/u  . Pacemaker Check    Medtronic ICD check      HPI  Miguel Pena is a 54 y.o. Caucasian male with nonischemic dilated cardiomyopathy with now mormalization of LVEF post CRT-D placement 03/2017, single vessel CAD s/p successful PCI to OM1 with 3.5 X 16 mm DES 10/2016, hyperlipidemia, prior smoking history, testosterone deficiency.  He has been doing well. He does not have any exertional chest pain, shortness of breath.  He has not taken Covid vaccine, but is prepared to have vaccination done and wanted to discuss with me.  He has been diagnosed with Crohn's disease and is seeing a gastroenterologist and continue to have bloating but symptoms have improved. He does report having occasional bleeding from his hemorrhoids.  Past Medical History:  Diagnosis Date  . Bursitis of right shoulder   . CHF (congestive heart failure) ([place])    [name] 10/24/2016  . Chronic combined systolic and diastolic heart failure ([place]) 03/16/2017  . Dilated cardiomyopathy ([place])    [name] 10/24/2016  . Dysrhythmia 1995   "VT" per patient- states had neg stress test and no problems since  . Encounter for adjustment or management of automatic implantable cardioverter-defibrillator 06/17/2018  . Encounter for assessment of automatic implantable cardioverter-defibrillator (AICD) 06/17/2018  . Heart murmur    "when I was young"  . History of kidney stones   . Hypercholesterolemia   . ICD: Medtronic MRI Quad CRTD (Bi-V ICD) implantation 03/16/2017 [name] 03/16/2017   Scheduled Remote ICD check   8.7.20: No VHR episodes. No mode switches. Normal health trends. Trans-thoracic impedance trends and the OptiVol Fluid Index do no present significant abnormalities. Battery longevity is 6.5-8.3 years. RA pacing is 8.3 %, RV pacing is 99.9 %, and LV pacing is 99.9  %.  Clinic: 05/18/17  . NICM (nonischemic cardiomyopathy) ([place]) 03/16/2017   Past Surgical History:  Procedure Laterality Date  . BIOPSY  12/06/2018   Procedure: BIOPSY;  Surgeon: Mauri Pole, MD;  Location: [facility];  Service: Endoscopy;;  Egd and Colon  . BIV ICD INSERTION CRT-D N/A 03/16/2017   Procedure: BIV ICD INSERTION CRT-D;  Surgeon: Deboraha Sprang, MD;  Location: [place];  Service: Cardiovascular;  Laterality: N/A;  . COLONOSCOPY WITH PROPOFOL N/A 03/10/2017   Procedure: COLONOSCOPY WITH PROPOFOL;  Surgeon: Mauri Pole, MD;  Location: [facility];  Service: Endoscopy;  Laterality: N/A;  . COLONOSCOPY WITH PROPOFOL N/A 12/06/2018   Procedure: COLONOSCOPY WITH PROPOFOL;  Surgeon: Mauri Pole, MD;  Location: [facility];  Service: Endoscopy;  Laterality: N/A;  . CORONARY ANGIOPLASTY WITH STENT PLACEMENT  10/28/2016  . CORONARY STENT INTERVENTION N/A 10/28/2016   Procedure: CORONARY STENT INTERVENTION;  Surgeon: [name], MD;  Location: [facility];  Service: Cardiovascular;  Laterality: N/A;  . ESOPHAGOGASTRODUODENOSCOPY (EGD) WITH PROPOFOL N/A 03/10/2017   Procedure: ESOPHAGOGASTRODUODENOSCOPY (EGD) WITH PROPOFOL;  Surgeon: Mauri Pole, MD;  [facility];  Service: Endoscopy;  Laterality: N/A;  . ESOPHAGOGASTRODUODENOSCOPY (EGD) WITH PROPOFOL N/A 12/06/2018   Procedure: ESOPHAGOGASTRODUODENOSCOPY (EGD) WITH PROPOFOL;  Surgeon: Mauri Pole, MD;  Location: [facility];  Service: Endoscopy;  Laterality: N/A;  . INGUINAL HERNIA REPAIR Right 02/16/2013   Procedure: RIGHT INGUINAL HERNIA REPAIR ;  Surgeon: [name],  MD;  Location: [facility];  Service: General;  Laterality: Right;  With MESH  . INGUINAL HERNIA REPAIR Right 02/16/2013  . INTRAVASCULAR PRESSURE WIRE/FFR STUDY N/A 10/28/2016   Procedure: INTRAVASCULAR PRESSURE WIRE/FFR STUDY;  Surgeon: [name], MD;  Location: [facility];   Service: Cardiovascular;  Laterality: N/A;  . RIGHT/LEFT HEART CATH AND CORONARY ANGIOGRAPHY N/A 10/28/2016   Procedure: RIGHT/LEFT HEART CATH AND CORONARY ANGIOGRAPHY;  Surgeon: [name], MD;  Location: [facility];  Service: Cardiovascular;  Laterality: N/A;  . ULTRASOUND GUIDANCE FOR VASCULAR ACCESS  10/28/2016   Procedure: Ultrasound Guidance For Vascular Access;  Surgeon: [name], MD;  Location: [facility];  Service: Cardiovascular;;   Social History   Tobacco Use  . Smoking status: Former Smoker    Packs/day: 1.00    Years: 12.00    Pack years: 12.00    Types: Cigarettes, Cigars    Quit date: 02/16/2011    Years since quitting: 8.7  . Smokeless tobacco: Former Systems developer    Types: Snuff  . Tobacco comment: 10/28/2016 'chewed for a couple years after I quit smoking"  Substance Use Topics  . Alcohol use: No    Comment: none since 9/11  Marital Status: Married  Family History  Problem Relation Age of Onset  . Heart attack Father   . Colon polyps Father   . Colon polyps Brother    Medications:   No Known Allergies   Current Outpatient Medications on File Prior to Visit  Medication Sig Dispense Refill  . bisoprolol (ZEBETA) 5 MG tablet TAKE 1 TABLET BY MOUTH EVERY DAY 90 tablet 3  . dicyclomine (BENTYL) 10 MG capsule Take 1 capsule (10 mg total) by mouth 3 (three) times daily. Take at noon, 4pm & evening (Patient taking differently: Take 10 mg by mouth daily. ) 90 capsule 3  . diphenhydrAMINE (BENADRYL ALLERGY) 25 MG tablet Take 25 mg by mouth every 6 (six) hours as needed.    Marland Kitchen esomeprazole (NEXIUM) 20 MG capsule Take 20 mg by mouth daily at 12 noon.    . furosemide (LASIX) 40 MG tablet Take 1 tablet (40 mg total) by mouth as needed for edema. (Patient taking differently: Take 40 mg by mouth daily as needed for edema. ) 30 tablet 0  . Homeopathic Products (ZICAM ALLERGY RELIEF NA) Place 1 spray into the nose daily as needed (allergies).      Marland Kitchen HUMIRA PEN 40 MG/0.4ML PNKT Inject 40 mg into the skin every 14 (fourteen) days. Begin on day 29 then every 14 days maintenance dose Dx K50.00 2 each 11  . HUMIRA PEN-CD/UC/HS STARTER 80 MG/0.8ML PNKT Inject 80 mg into the skin as directed. 2 syringes day 1 then 1 syringe day 15 then begin new dose day 29 Dx Crohn's K50.00 Starter dose 3 each 0  . HYDROcodone-acetaminophen (NORCO) 10-325 MG tablet Take 1 tablet by mouth every 4 (four) hours as needed for moderate pain.     Marland Kitchen mercaptopurine (PURINETHOL) 50 MG tablet Take 1 tablet (50 mg total) by mouth daily. Give on an empty stomach 1 hour before or 2 hours after meals. Caution: Chemotherapy. 30 tablet 6  . Multiple Vitamins-Minerals (HAIR/SKIN/NAILS) CAPS Take 2 capsules by mouth daily.    . nicotine polacrilex (NICORETTE) 4 MG gum Take 4 mg by mouth as needed for smoking cessation.    . potassium chloride SA (KLOR-CON) 20 MEQ tablet TAKE 1 TABLET BY MOUTH DAILY 90 tablet  0  . rosuvastatin (CRESTOR) 40 MG tablet Take 1 tablet (40 mg total) by mouth daily. 90 tablet 3  . sacubitril-valsartan (ENTRESTO) 97-103 MG Take 1 tablet by mouth 2 (two) times daily. 180 tablet 3  . spironolactone (ALDACTONE) 25 MG tablet TAKE 1 TABLET BY MOUTH EVERY DAY 90 tablet 1   No current facility-administered medications on file prior to visit.   Recent labs:  CMP Latest Ref Rng & Units 05/09/2019 04/13/2019 01/05/2019  Glucose 65 - 99 mg/dL - - 114(H)  BUN 6 - 24 mg/dL - - 10  Creatinine 0.76 - 1.27 mg/dL - - 0.92  Sodium 134 - 144 mmol/L - - 141  Potassium 3.5 - 5.2 mmol/L - - 4.3  Chloride 96 - 106 mmol/L - - 103  CO2 20 - 29 mmol/L - - 23  Calcium 8.7 - 10.2 mg/dL - - 10.0  Total Protein 6.0 - 8.3 g/dL 6.6 6.8 -  Total Bilirubin 0.2 - 1.2 mg/dL 1.5(H) 1.2 -  Alkaline Phos 39 - 117 U/L 51 53 -  AST 0 - 37 U/L 13 17 -  ALT 0 - 53 U/L 16 28 -   CBC Latest Ref Rng & Units 05/09/2019 04/13/2019 09/30/2018  WBC 4.0 - 10.5 K/uL 8.0 8.0 6.1  Hemoglobin 13.0 - 17.0  g/dL 12.3(L) 12.6(L) 11.4(L)  Hematocrit 39 - 52 % 36.5(L) 36.0(L) 34.5(L)  Platelets 150 - 400 K/uL 230.0 236.0 225   Lipid Panel Recent Labs    01/05/19 1006 11/15/19 1327  CHOL 139 95*  TRIG 236* 161*  LDLCALC 52 32  HDL 50 36*  CHOLHDL 2.8  --     HEMOGLOBIN A1C Lab Results  Component Value Date   HGBA1C 5.3 09/25/2016   TSH No results for input(s): TSH in the last 8760 hours.   Review of Systems  Cardiovascular: Negative for chest pain, dyspnea on exertion and leg swelling.  Gastrointestinal: Positive for bloating (chronic). Negative for melena.   Objective:   Vitals with BMI 11/03/2019 06/03/2019 04/13/2019  Height 5' 10"  5' 10"  5' 10"   Weight 190 lbs 196 lbs 10 oz 200 lbs 6 oz  BMI 27.26 67.12 45.80  Systolic 998 96 338  Diastolic 62 52 72  Pulse 58 63 68    Blood pressure 106/62, pulse (!) 58, resp. rate 16, height 5' 10"  (1.778 m), weight 190 lb (86.2 kg), SpO2 98 %.  Physical Exam Vitals and nursing note reviewed.  Constitutional:      General: He is not in acute distress.    Appearance: He is well-developed.  HENT:     Head: Normocephalic and atraumatic.  Eyes:     Conjunctiva/sclera: Conjunctivae normal.     Pupils: Pupils are equal, round, and reactive to light.  Neck:     Vascular: No JVD.  Cardiovascular:     Rate and Rhythm: Normal rate and regular rhythm.     Pulses: Intact distal pulses.     Heart sounds: No murmur heard.   Pulmonary:     Effort: Pulmonary effort is normal.     Breath sounds: Normal breath sounds. No wheezing or rales.  Abdominal:     General: Bowel sounds are normal.     Palpations: Abdomen is soft.     Tenderness: There is no rebound.  Lymphadenopathy:     Cervical: No cervical adenopathy.  Skin:    General: Skin is warm and dry.  Neurological:     Mental Status: He is alert  and oriented to person, place, and time.     Cranial Nerves: No cranial nerve deficit.    Cardiovascular studies:   Coronary angiogram  10/28/2016 NLeft/right heart cath, coronary angiography and angioplasty  Nonischemic cardiomyopathy with single vessel obstructive disease Proximal OM1 3 5 x 16 mm Promus Premier drug-eluting stent.  Echocardiogram 09/22/2017: Left ventricle cavity is normal in size. Mild concentric hypertrophy of the left ventricle. Normal global wall motion. Normal diastolic filling pattern. Calculated EF 55%. Left atrial cavity is moderately dilated at 4.8 cm. Right ventricle cavity is normal in size. Normal right ventricular function. Pacemaker lead/ICD lead noted in the RV. Trace aortic regurgitation. Borderline prolapse of the posterior > anterior MV leaflet with resultant Moderate anteriorly directed mitral regurgitation. Mild tricuspid regurgitation. No evidence of pulmonary hypertension. Compared to the study done on 02/06/2017, EF is improved from 20%, moderately severe MR is now moderate at most.  Device Clinic: Medtronic MRI Quad CRTD (Bi-V ICD) implantation 03/16/2017   Scheduled  In office ICD 11/03/19  Single (S)/Dual (D)/BV (M) BV Presenting ASBP Pacer dependant: No. Underlying SB @ 55/min. AP 6%, BP 98.6.% AMS Episodes 0.   HVR 1. Longest Short 1 S.  Longevity 4.5 Years/Voltage. Charge time 3.7Sec. Lead measurements: Stable Histogram: Low (L)/normal (N)/high (H)  Normal Patient activity Good. Thoracic impedance: No fluid ovrerload  Observations: Normal BV ICD.  Changes: None   EKG:    EKG 12/20/2018: Sinus rhythm 63 bpm.  Biventricular paced rhtyhm.  Assessment:     ICD-10-CM   1. Encounter for adjustment or management of automatic implantable cardioverter-defibrillator  Z45.02   2. ICD: Medtronic MRI Quad CRTD (Bi-V ICD) implantation 03/16/2017 [name]  Z95.810   3. NSVT (nonsustained ventricular tachycardia) ([facility])  I47.2 PCV ECHOCARDIOGRAM COMPLETE  4. NICM (nonischemic cardiomyopathy) ([place])  I42.8 PCV ECHOCARDIOGRAM COMPLETE  5. Coronary artery disease involving native  coronary artery of native heart without angina pectoris  I25.10 icosapent Ethyl (VASCEPA) 1 g capsule    icosapent Ethyl (VASCEPA) 1 g capsule  6. Mixed hyperlipidemia  E78.2 Lipid Panel With LDL/HDL Ratio    icosapent Ethyl (VASCEPA) 1 g capsule    icosapent Ethyl (VASCEPA) 1 g capsule    Plan:   Miguel Pena is a 54 y.o. Caucasian male with nonischemic dilated cardiomyopathy with now mormalization of LVEF post CRT-D placement 03/2017, single vessel CAD s/p successful PCI to OM1 with 3.5 X 16 mm DES 10/2016, hyperlipidemia, prior smoking history, testosterone deficiency.  He has been doing well. He does not have any exertional chest pain, shortness of breath.  He has not taken Covid vaccine, but is prepared to have vaccination done and wanted to discuss with me.  I have encouraged him to get vaccinated.  I reviewed his ICD data with the patient.  Although about a month ago he had excess fluid, and called him and he stated he was asymptomatic.  Advised him to continue to be careful with salt intake and to contact us immediately if he were to develop any dyspnea.  He has not had any sustained VT or therapy.  I reviewed his external labs, continues to have mixed hyperlipidemia and triglycerides are not well controlled.  We discussed regarding making lifestyle changes but would like to repeat his lipids, will consider addition of Vascepa if triglycerides still elevated in view of underlying coronary artery disease.  I will see him back again in a year as he has done well and has remained well compensated.  I will repeat echocardiogram to reevaluate his LV systolic function.  Suspect biventricular pacing is significantly improved his LVEF.   [name], MD, [facility] 11/16/2019, 6:43 AM Office: [phone] Pager: [phone]

## 2019-11-15 ENCOUNTER — Ambulatory Visit: Payer: 59

## 2019-11-15 ENCOUNTER — Other Ambulatory Visit: Payer: Self-pay

## 2019-11-15 DIAGNOSIS — I4729 Other ventricular tachycardia: Secondary | ICD-10-CM

## 2019-11-15 DIAGNOSIS — I428 Other cardiomyopathies: Secondary | ICD-10-CM

## 2019-11-15 DIAGNOSIS — I472 Ventricular tachycardia: Secondary | ICD-10-CM

## 2019-11-15 DIAGNOSIS — R931 Abnormal findings on diagnostic imaging of heart and coronary circulation: Secondary | ICD-10-CM

## 2019-11-15 DIAGNOSIS — E782 Mixed hyperlipidemia: Secondary | ICD-10-CM

## 2019-11-16 ENCOUNTER — Other Ambulatory Visit: Payer: Self-pay | Admitting: Cardiology

## 2019-11-16 DIAGNOSIS — I2510 Atherosclerotic heart disease of native coronary artery without angina pectoris: Secondary | ICD-10-CM

## 2019-11-16 DIAGNOSIS — E782 Mixed hyperlipidemia: Secondary | ICD-10-CM

## 2019-11-16 LAB — LIPID PANEL WITH LDL/HDL RATIO
Cholesterol, Total: 95 mg/dL — ABNORMAL LOW (ref 100–199)
HDL: 36 mg/dL — ABNORMAL LOW (ref >39)
LDL Chol Calc (NIH): 32 mg/dL (ref 0–99)
LDL/HDL Ratio: 0.9 ratio (ref 0.0–3.6)
Triglycerides: 161 mg/dL — ABNORMAL HIGH (ref 0–149)
VLDL Cholesterol Cal: 27 mg/dL (ref 5–40)

## 2019-11-16 MED ORDER — ICOSAPENT ETHYL 1 G PO CAPS: 2.0000 g | ORAL_CAPSULE | Freq: Two times a day (BID) | ORAL | 0 refills | Status: DC

## 2019-11-16 MED ORDER — ICOSAPENT ETHYL 1 G PO CAPS: 2.0000 g | ORAL_CAPSULE | Freq: Two times a day (BID) | ORAL | 3 refills | Status: DC

## 2019-11-16 NOTE — Progress Notes (Unsigned)
I reviewed the echocardiogram, I called the patient and discussed with him in detail.  I personally reviewed the images along with my partners and compared to prior echocardiograms as well.  There clearly appears to be a echolucent structure attached to the RV lead and cannot exclude thrombus.  I recommended that patient proceed with TEE.  I have discussed the potential complications including aspiration pneumonia and esophageal perforation with less than 1% risk.  Patient is willing to proceed.  I also discussed regarding his recent lipid profile testing.  Patient states that he has been drinking at least 6-7 soft drinks a day.  Advised him he can hold off on starting Vascepa for now, recheck lipids in 1 month.    ICD-10-CM   1. Mixed hyperlipidemia  E78.2 Lipid Panel With LDL/HDL Ratio  2. NICM (nonischemic cardiomyopathy) ([place])  I42.8 PCV ECHOCARDIOGRAM COMPLETE  3. NSVT (nonsustained ventricular tachycardia) ([facility])  I47.2 PCV ECHOCARDIOGRAM COMPLETE  4. Abnormal echocardiogram  R93.1     Echocardiogram 11/15/2019: Normal LV systolic function with visual EF 50-55%. Left ventricle cavity is normal in size. Mild left ventricular hypertrophy. Unable to evaluate diastolic function due to mitral annular calcification. Normal LAP.  Endocardial wires noted within the right cardiac chambers.  Echo dense structure noted on the ICD lead within the right ventricle differential diagnosis includes but not limited to: thrombus, vegetation, reverberation artifact. It is more prominent compared to prior study from 09/2017.  Left atrial cavity is moderately dilated. Trace aortic regurgitation. Native mitral valve with mitral annular calcification and prolapse of posterior leaflet with an eccentric jet directed along the anterior mitral valve leaflet. Moderate (Grade II) mitral regurgitation. Mild tricuspid regurgitation. No pulmonary hypertension, RVSP 53mHg. Compared to prior study dated 09/22/2017: No  significant changes except the echo dense structure noted on ICD lead as discussed above.  Recommended transesophageal echocardiogram, if clinically indicated.    JAdrian Prows MD, [facility]11/10/2019, 5:40 PM Office: [phone]: [phone]

## 2019-11-16 NOTE — Addendum Note (Signed)
Addended by: Kela Millin on: 11/16/2019 06:43 AM   Modules accepted: Orders

## 2019-11-16 NOTE — Progress Notes (Signed)
Please schedule for TEE under ST for abnormal mass in right ventricle/thrombus on pacemaker wire. JG

## 2019-11-19 ENCOUNTER — Other Ambulatory Visit: Payer: Self-pay | Admitting: Cardiology

## 2019-11-23 ENCOUNTER — Other Ambulatory Visit (INDEPENDENT_AMBULATORY_CARE_PROVIDER_SITE_OTHER): Payer: 59

## 2019-11-23 ENCOUNTER — Ambulatory Visit (INDEPENDENT_AMBULATORY_CARE_PROVIDER_SITE_OTHER): Payer: 59 | Admitting: Gastroenterology

## 2019-11-23 ENCOUNTER — Encounter: Payer: Self-pay | Admitting: Gastroenterology

## 2019-11-23 VITALS — BP 100/60 | HR 76 | Ht 70.0 in | Wt 185.0 lb

## 2019-11-23 DIAGNOSIS — K219 Gastro-esophageal reflux disease without esophagitis: Secondary | ICD-10-CM | POA: Diagnosis not present

## 2019-11-23 DIAGNOSIS — K5000 Crohn's disease of small intestine without complications: Secondary | ICD-10-CM | POA: Diagnosis not present

## 2019-11-23 LAB — VITAMIN B12: Vitamin B-12: 964 pg/mL — ABNORMAL HIGH (ref 211–911)

## 2019-11-23 LAB — IBC + FERRITIN
Ferritin: 199.8 ng/mL (ref 22.0–322.0)
Iron: 75 ug/dL (ref 42–165)
Saturation Ratios: 24.4 % (ref 20.0–50.0)
Transferrin: 220 mg/dL (ref 212.0–360.0)

## 2019-11-23 LAB — COMPREHENSIVE METABOLIC PANEL
ALT: 13 U/L (ref 0–53)
AST: 16 U/L (ref 0–37)
Albumin: 4.1 g/dL (ref 3.5–5.2)
Alkaline Phosphatase: 58 U/L (ref 39–117)
BUN: 5 mg/dL — ABNORMAL LOW (ref 6–23)
CO2: 29 mEq/L (ref 19–32)
Calcium: 10 mg/dL (ref 8.4–10.5)
Chloride: 106 mEq/L (ref 96–112)
Creatinine, Ser: 0.8 mg/dL (ref 0.40–1.50)
GFR: 99.5 mL/min (ref >60.00)
Glucose, Bld: 107 mg/dL — ABNORMAL HIGH (ref 70–99)
Potassium: 4.5 mEq/L (ref 3.5–5.1)
Sodium: 140 mEq/L (ref 135–145)
Total Bilirubin: 1.2 mg/dL (ref 0.2–1.2)
Total Protein: 6.9 g/dL (ref 6.0–8.3)

## 2019-11-23 LAB — CBC WITH DIFFERENTIAL/PLATELET
Basophils Absolute: 0.1 10*3/uL (ref 0.0–0.1)
Basophils Relative: 1 % (ref 0.0–3.0)
Eosinophils Absolute: 0.1 10*3/uL (ref 0.0–0.7)
Eosinophils Relative: 1.2 % (ref 0.0–5.0)
HCT: 35.9 % — ABNORMAL LOW (ref 39.0–52.0)
Hemoglobin: 12.4 g/dL — ABNORMAL LOW (ref 13.0–17.0)
Lymphocytes Relative: 24.2 % (ref 12.0–46.0)
Lymphs Abs: 1.3 10*3/uL (ref 0.7–4.0)
MCHC: 34.6 g/dL (ref 30.0–36.0)
MCV: 87.9 fl (ref 78.0–100.0)
Monocytes Absolute: 0.3 10*3/uL (ref 0.1–1.0)
Monocytes Relative: 5.2 % (ref 3.0–12.0)
Neutro Abs: 3.6 10*3/uL (ref 1.4–7.7)
Neutrophils Relative %: 68.4 % (ref 43.0–77.0)
Platelets: 223 10*3/uL (ref 150.0–400.0)
RBC: 4.09 Mil/uL — ABNORMAL LOW (ref 4.22–5.81)
RDW: 13.9 % (ref 11.5–15.5)
WBC: 5.3 10*3/uL (ref 4.0–10.5)

## 2019-11-23 LAB — FOLATE: Folate: >23.6 ng/mL (ref >5.9)

## 2019-11-23 LAB — SEDIMENTATION RATE: Sed Rate: 18 mm/hr (ref 0–20)

## 2019-11-23 MED ORDER — MERCAPTOPURINE 50 MG PO TABS
50.0000 mg | ORAL_TABLET | Freq: Every day | ORAL | 6 refills | Status: DC
Start: 2019-11-23 — End: 2020-02-20

## 2019-11-23 MED ORDER — ESOMEPRAZOLE MAGNESIUM 20 MG PO CPDR
20.0000 mg | DELAYED_RELEASE_CAPSULE | Freq: Every day | ORAL | 11 refills | Status: DC
Start: 2019-11-23 — End: 2020-11-08

## 2019-11-23 NOTE — Patient Instructions (Signed)
Your provider has requested that you go to the basement level for lab work before leaving today. Press "B" on the elevator. The lab is located at the first door on the left as you exit the elevator.  We will refill mercaptopurine and Nexium for you today  Follow up in 3 months, you will need to call us back for that appointment  If you are age 55 or older, your body mass index should be between 23-30. Your Body mass index is 26.54 kg/m. If this is out of the aforementioned range listed, please consider follow up with your Primary Care Provider.  If you are age 70 or younger, your body mass index should be between 19-25. Your Body mass index is 26.54 kg/m. If this is out of the aformentioned range listed, please consider follow up with your Primary Care Provider.    Due to recent changes in healthcare laws, you may see the results of your imaging and laboratory studies on MyChart before your provider has had a chance to review them.  We understand that in some cases there may be results that are confusing or concerning to you. Not all laboratory results come back in the same time frame and the provider may be waiting for multiple results in order to interpret others.  Please give [place] 48 hours in order for your provider to thoroughly review all the results before contacting the office for clarification of your results.   I appreciate the  opportunity to care for you  Thank You   Harl Bowie , MD

## 2019-11-23 NOTE — Progress Notes (Signed)
Miguel Pena    [id number]    1965-07-12  Primary Care Physician:Elkins, Curt Jews, MD  Referring Physician: [name], MD [address] [place],  [place] [address]   Chief complaint: Crohn's disease HPI:  54 year old very pleasant gentleman with history of CHF, CAD on Plavix, follow-up visit for Crohn's disease.  He completed prednisone taper in May 2021.  He is currently taking 6-MP 50 mg daily.  He has not started Humira injections, is worried about potential side effects.  He is overall doing well.  Denies any diarrhea, constipation, melena or blood in stool  No dysphagia, vomiting or abdominal pain.  GERD symptoms currently under control with Nexium once daily.  He has changed his diet and lost weight intentionally  He is s/p CRT-D March 2019.  He is scheduled for transesophageal echocardiogram 11/30/2019  Relevant GI history: EGD December 06, 2018:LA grade C esophagitis and gastritis. H. pylori negative  Colonoscopy December 06, 2018:Terminal ileum ulceration with ileitis, biopsies consistent with IBD. Diverticulosis and internal hemorrhoids otherwise normal exam.  Capsule endoscopy October 22, 2018: Multiple aphthous ulcers and inflammation throughout small bowel, predominantly in the distal small bowel consistent with Crohn's ileitis  03/10/2017 EGD:LA grade C reflux and candidiasis esophagitis, benign-appearing esophageal stenosis, gastritis with hemorrhage, duodenal erosions without bleeding otherwise normal. Started on Nexium 40 twice daily.  03/10/2017 Colonoscopy:moderate diverticulosis in the sigmoid and descending colon, narrowing of the colon in association with diverticular opening, peridiverticular erythema, 1 5 mm polyp in the sigmoid colon and nonbleeding internal hemorrhoids.  Pathology showed chronic gastritis and reflux esophagitis negative for H. pylori or dysplasia. Polyp removed was hyperplastic.  Recall colonoscopy recommended in 10 years.  CT abdomen pelvis with contrast 09/29/2018 with inflammatory changes and thickening of the distal and terminal ileum which may represent infectious enteritis or an inflammatory bowel disease. No bowel obstruction, normal appendix. Sigmoid diverticulosis and cholelithiasis.    Outpatient Encounter Medications as of 11/23/2019  Medication Sig  . bisoprolol (ZEBETA) 5 MG tablet TAKE 1 TABLET BY MOUTH EVERY DAY  . dicyclomine (BENTYL) 10 MG capsule Take 1 capsule (10 mg total) by mouth 3 (three) times daily. Take at noon, 4pm & evening (Patient taking differently: Take 10 mg by mouth 2 (two) times daily. )  . diphenhydrAMINE (BENADRYL ALLERGY) 25 MG tablet Take 25 mg by mouth every 6 (six) hours as needed.  Marland Kitchen esomeprazole (NEXIUM) 20 MG capsule Take 1 capsule (20 mg total) by mouth daily at 12 noon.  . furosemide (LASIX) 40 MG tablet Take 1 tablet (40 mg total) by mouth as needed for edema. (Patient taking differently: Take 40 mg by mouth daily as needed for edema. )  . Homeopathic Products (ZICAM ALLERGY RELIEF NA) Place 1 spray into the nose daily as needed (allergies).  Marland Kitchen HYDROcodone-acetaminophen (NORCO) 10-325 MG tablet Take 1 tablet by mouth every 4 (four) hours as needed for moderate pain.   Marland Kitchen icosapent Ethyl (VASCEPA) 1 g capsule Take 2 capsules (2 g total) by mouth 2 (two) times daily.  Marland Kitchen icosapent Ethyl (VASCEPA) 1 g capsule TAKE 2 CAPSULES(2 GRAMS) BY MOUTH TWICE DAILY (Patient taking differently: Take 2 g by mouth 2 (two) times daily. )  . mercaptopurine (PURINETHOL) 50 MG tablet Take 1 tablet (50 mg total) by mouth daily. Give on an empty stomach 1 hour before or 2 hours after meals. Caution: Chemotherapy.  . Multiple Vitamins-Minerals (HAIR/SKIN/NAILS) CAPS Take 2 capsules  by mouth daily.  . nicotine polacrilex (NICORETTE) 4 MG gum Take 4 mg by mouth as needed for smoking cessation.  . potassium chloride SA (KLOR-CON) 20 MEQ tablet  TAKE 1 TABLET BY MOUTH DAILY  . rosuvastatin (CRESTOR) 40 MG tablet Take 1 tablet (40 mg total) by mouth daily.  . sacubitril-valsartan (ENTRESTO) 97-103 MG Take 1 tablet by mouth 2 (two) times daily.  Marland Kitchen spironolactone (ALDACTONE) 25 MG tablet TAKE 1 TABLET BY MOUTH EVERY DAY  . [DISCONTINUED] esomeprazole (NEXIUM) 20 MG capsule Take 20 mg by mouth daily at 12 noon.  . [DISCONTINUED] HUMIRA PEN-CD/UC/HS STARTER 80 MG/0.8ML PNKT Inject 80 mg into the skin as directed. 2 syringes day 1 then 1 syringe day 15 then begin new dose day 29 Dx Crohn's K50.00 Starter dose  . [DISCONTINUED] mercaptopurine (PURINETHOL) 50 MG tablet Take 1 tablet (50 mg total) by mouth daily. Give on an empty stomach 1 hour before or 2 hours after meals. Caution: Chemotherapy.  Marland Kitchen HUMIRA PEN 40 MG/0.4ML PNKT Inject 40 mg into the skin every 14 (fourteen) days. Begin on day 29 then every 14 days maintenance dose Dx K50.00 (Patient not taking: Reported on 11/23/2019)   No facility-administered encounter medications on file as of 11/23/2019.    Allergies as of 11/23/2019  . (No Known Allergies)    Past Medical History:  Diagnosis Date  . Bursitis of right shoulder   . CHF (congestive heart failure) ([facility])    [name] 10/24/2016  . Chronic combined systolic and diastolic heart failure ([facility]) 03/16/2017  . Dilated cardiomyopathy ([facility])    [name] 10/24/2016  . Dysrhythmia 1995   "VT" per patient- states had neg stress test and no problems since  . Encounter for adjustment or management of automatic implantable cardioverter-defibrillator 06/17/2018  . Encounter for assessment of automatic implantable cardioverter-defibrillator (AICD) 06/17/2018  . Heart murmur    "when I was young"  . History of kidney stones   . Hypercholesterolemia   . ICD: Medtronic MRI Quad CRTD (Bi-V ICD) implantation 03/16/2017 [name] 03/16/2017   Scheduled Remote ICD check   8.7.20: No VHR episodes. No mode switches. Normal health trends. Trans-thoracic  impedance trends and the OptiVol Fluid Index do no present significant abnormalities. Battery longevity is 6.5-8.3 years. RA pacing is 8.3 %, RV pacing is 99.9 %, and LV pacing is 99.9 %.  Clinic: 05/18/17  . NICM (nonischemic cardiomyopathy) ([facility]) 03/16/2017    Past Surgical History:  Procedure Laterality Date  . BIOPSY  12/06/2018   Procedure: BIOPSY;  Surgeon: Mauri Pole, MD;  Location: [facility];  Service: Endoscopy;;  Egd and Colon  . BIV ICD INSERTION CRT-D N/A 03/16/2017   Procedure: BIV ICD INSERTION CRT-D;  Surgeon: Deboraha Sprang, MD;  Location: [facility];  Service: Cardiovascular;  Laterality: N/A;  . COLONOSCOPY WITH PROPOFOL N/A 03/10/2017   Procedure: COLONOSCOPY WITH PROPOFOL;  Surgeon: Mauri Pole, MD;  Location: [facility];  Service: Endoscopy;  Laterality: N/A;  . COLONOSCOPY WITH PROPOFOL N/A 12/06/2018   Procedure: COLONOSCOPY WITH PROPOFOL;  Surgeon: Mauri Pole, MD;  Location: [facility];  Service: Endoscopy;  Laterality: N/A;  . CORONARY ANGIOPLASTY WITH STENT PLACEMENT  10/28/2016  . CORONARY STENT INTERVENTION N/A 10/28/2016   Procedure: CORONARY STENT INTERVENTION;  Surgeon: [name], MD;  Location: [facility];  Service: Cardiovascular;  Laterality: N/A;  . ESOPHAGOGASTRODUODENOSCOPY (EGD) WITH PROPOFOL N/A 03/10/2017   Procedure: ESOPHAGOGASTRODUODENOSCOPY (EGD) WITH PROPOFOL;  Surgeon: Silverio Decamp,  Venia Minks, MD;  Location: [facility];  Service: Endoscopy;  Laterality: N/A;  . ESOPHAGOGASTRODUODENOSCOPY (EGD) WITH PROPOFOL N/A 12/06/2018   Procedure: ESOPHAGOGASTRODUODENOSCOPY (EGD) WITH PROPOFOL;  Surgeon: Mauri Pole, MD;  Location: [facility];  Service: Endoscopy;  Laterality: N/A;  . INGUINAL HERNIA REPAIR Right 02/16/2013   Procedure: RIGHT INGUINAL HERNIA REPAIR ;  Surgeon: [name], MD;  Location: [facility];  Service: General;  Laterality: Right;  With MESH  . INGUINAL HERNIA REPAIR Right  02/16/2013  . INTRAVASCULAR PRESSURE WIRE/FFR STUDY N/A 10/28/2016   Procedure: INTRAVASCULAR PRESSURE WIRE/FFR STUDY;  Surgeon: [name], MD;  Location: [facility];  Service: Cardiovascular;  Laterality: N/A;  . RIGHT/LEFT HEART CATH AND CORONARY ANGIOGRAPHY N/A 10/28/2016   Procedure: RIGHT/LEFT HEART CATH AND CORONARY ANGIOGRAPHY;  Surgeon: [name], MD;  Location: [facility];  Service: Cardiovascular;  Laterality: N/A;  . ULTRASOUND GUIDANCE FOR VASCULAR ACCESS  10/28/2016   Procedure: Ultrasound Guidance For Vascular Access;  Surgeon: [name], MD;  Location: [facility];  Service: Cardiovascular;;    Family History  Problem Relation Age of Onset  . Heart attack Father   . Colon polyps Father   . Colon polyps Brother   . Colon cancer Neg Hx   . Stomach cancer Neg Hx   . Pancreatic cancer Neg Hx   . Esophageal cancer Neg Hx   . Liver disease Neg Hx     Social History   Socioeconomic History  . Marital status: Married    Spouse name: Not on file  . Number of children: 2  . Years of education: Not on file  . Highest education level: Not on file  Occupational History  . Occupation: Emergency planning/management officer    Comment: [employer]  Tobacco Use  . Smoking status: Former Smoker    Packs/day: 1.00    Years: 12.00    Pack years: 12.00    Types: Cigarettes, Cigars    Quit date: 02/16/2011    Years since quitting: 8.7  . Smokeless tobacco: Former Systems developer    Types: Snuff  . Tobacco comment: 10/28/2016 'chewed for a couple years after I quit smoking"  Vaping Use  . Vaping Use: Never used  Substance and Sexual Activity  . Alcohol use: No  . Drug use: No  . Sexual activity: Not Currently  Other Topics Concern  . Not on file  Social History Narrative  . Not on file   Social Determinants of Health   Financial Resource Strain:   . Difficulty of Paying Living Expenses: Not on file  Food Insecurity:   . Worried About Paediatric nurse in the Last Year: Not on file  . Ran Out of Food in the Last Year: Not on file  Transportation Needs:   . Lack of Transportation (Medical): Not on file  . Lack of Transportation (Non-Medical): Not on file  Physical Activity:   . Days of Exercise per Week: Not on file  . Minutes of Exercise per Session: Not on file  Stress:   . Feeling of Stress : Not on file  Social Connections:   . Frequency of Communication with Friends and Family: Not on file  . Frequency of Social Gatherings with Friends and Family: Not on file  . Attends Religious Services: Not on file  . Active Member of Clubs or Organizations: Not on file  . Attends Archivist Meetings: Not on file  .  Marital Status: Not on file  Intimate Partner Violence:   . Fear of Current or Ex-Partner: Not on file  . Emotionally Abused: Not on file  . Physically Abused: Not on file  . Sexually Abused: Not on file      Review of systems: All other review of systems negative except as mentioned in the HPI.   Physical Exam: Vitals:   11/23/19 1102  BP: 100/60  Pulse: 76  SpO2: 98%   Body mass index is 26.54 kg/m. Gen:      No acute distress HEENT:  sclera anicteric Abd:      soft, non-tender; no palpable masses, no distension Ext:    No edema Neuro: alert and oriented x 3 Psych: normal mood and affect  Data Reviewed:  Reviewed labs, radiology imaging, old records and pertinent past GI work up   Assessment and Plan/Recommendations:  54 year old very pleasant gentleman with history of CAD, CHF s/p CRT-D, GERD and Crohn's disease  Overall his symptoms are stable Currently in clinical remission on 6-MP 50 mg daily Check CBC, CMP, sed rate, iron panel, B12 and folate for monitoring IBD disease activity and health maintenance  If sed rate significantly elevated, will consider increasing the dose of 6-MP.  Patient is reluctant to start Humira or any Biologics at this point  GERD: Symptoms are  stable, continue Nexium daily and antireflux measures  He is reluctant to proceed with Covid vaccination, discussed potential benefits and risks and encouraged him to get vaccinated  Return in 3 months or sooner if needed  This visit required 40 minutes of patient care (this includes precharting, chart review, review of results, face-to-face time used for counseling as well as treatment plan and follow-up. The patient was provided an opportunity to ask questions and all were answered. The patient agreed with the plan and demonstrated an understanding of the instructions.  Damaris Hippo , MD    CC: [name], *

## 2019-11-24 ENCOUNTER — Other Ambulatory Visit: Payer: Self-pay

## 2019-11-24 MED ORDER — DICYCLOMINE HCL 10 MG PO CAPS
10.0000 mg | ORAL_CAPSULE | Freq: Three times a day (TID) | ORAL | 1 refills | Status: DC
Start: 2019-11-24 — End: 2020-03-19

## 2019-11-25 ENCOUNTER — Telehealth: Payer: Self-pay | Admitting: *Deleted

## 2019-11-25 NOTE — Telephone Encounter (Signed)
Sent in prior auth to Cover My Meds today for Esomeprazole 20 mg daily  Waiting on response

## 2019-11-26 ENCOUNTER — Other Ambulatory Visit (HOSPITAL_COMMUNITY)
Admission: RE | Admit: 2019-11-26 | Discharge: 2019-11-26 | Disposition: A | Discharge status: Home / Self Care | Payer: 59 | Source: Ambulatory Visit | Attending: Cardiology | Admitting: Cardiology

## 2019-11-26 DIAGNOSIS — Z20822 Contact with and (suspected) exposure to covid-19: Secondary | ICD-10-CM | POA: Insufficient documentation

## 2019-11-26 DIAGNOSIS — Z01812 Encounter for preprocedural laboratory examination: Secondary | ICD-10-CM | POA: Insufficient documentation

## 2019-11-26 LAB — SARS CORONAVIRUS 2 (TAT 6-24 HRS): SARS Coronavirus 2: NEGATIVE

## 2019-11-28 NOTE — Telephone Encounter (Signed)
Nexium 20 mg once daily denied by patients insurance, Pt will have to purchase Esomeprazole OTC

## 2019-11-29 NOTE — Anesthesia Preprocedure Evaluation (Addendum)
Anesthesia Evaluation  Patient identified by MRN, date of birth, ID band Patient awake    Reviewed: Allergy & Precautions, NPO status , Patient's Chart, lab work & pertinent test results, reviewed documented beta blocker date and time   Airway Mallampati: I  TM Distance: >3 FB Neck ROM: Full    Dental no notable dental hx. (+) Teeth Intact, Dental Advisory Given   Pulmonary neg pulmonary ROS, former smoker,    Pulmonary exam normal breath sounds clear to auscultation       Cardiovascular + CAD, + Cardiac Stents (2018), +CHF, + PND and + DVT  Normal cardiovascular exam+ dysrhythmias Ventricular Tachycardia + Cardiac Defibrillator + Valvular Problems/Murmurs MR  Rhythm:Regular Rate:Normal  Echocardiogram 11/15/2019:  Normal LV systolic function with visual EF 50-55%. Left ventricle cavity is normal in size. Mild left ventricular hypertrophy. Unable to evaluate diastolic function due to mitral annular calcification. Normal LAP. Endocardial wires noted within the right cardiac chambers. Echo dense structure noted on the ICD lead within the right ventricle differential diagnosis includes but not limited to: thrombus, vegetation, reverberation artifact. It is more prominent compared to prior study from 09/2017.  Left atrial cavity is moderately dilated.  Trace aortic regurgitation.  Native mitral valve with mitral annular calcification and prolapse of posterior leaflet with an eccentric jet directed along the anterior mitral valve leaflet. Moderate (Grade II) mitral regurgitation. Mild tricuspid regurgitation. No pulmonary hypertension, RVSP 25mHg.  Compared to prior study dated 09/22/2017: No significant changes except the echo dense structure noted on ICD lead as discussed above. Recommended transesophageal echocardiogram, if clinically indicated.    Neuro/Psych negative neurological ROS  negative psych ROS   GI/Hepatic Neg liver ROS, GERD   Medicated and Controlled,  Endo/Other  negative endocrine ROS  Renal/GU negative Renal ROS  negative genitourinary   Musculoskeletal negative musculoskeletal ROS (+)   Abdominal   Peds  Hematology negative hematology ROS (+)   Anesthesia Other Findings   Reproductive/Obstetrics                            Anesthesia Physical Anesthesia Plan  ASA: III  Anesthesia Plan: MAC   Post-op Pain Management:    Induction: Intravenous  PONV Risk Score and Plan: 1 and Propofol infusion and Treatment may vary due to age or medical condition  Airway Management Planned: Natural Airway  Additional Equipment:   Intra-op Plan:   Post-operative Plan:   Informed Consent: I have reviewed the patients History and Physical, chart, labs and discussed the procedure including the risks, benefits and alternatives for the proposed anesthesia with the patient or authorized representative who has indicated his/her understanding and acceptance.     Dental advisory given  Plan Discussed with: CRNA  Anesthesia Plan Comments:         Anesthesia Quick Evaluation

## 2019-11-30 ENCOUNTER — Other Ambulatory Visit: Payer: Self-pay

## 2019-11-30 ENCOUNTER — Encounter (HOSPITAL_COMMUNITY)
Admission: RE | Disposition: A | Discharge status: Home / Self Care | Payer: Self-pay | Source: Home / Self Care | Attending: Cardiology

## 2019-11-30 ENCOUNTER — Ambulatory Visit (HOSPITAL_COMMUNITY)
Admission: RE | Admit: 2019-11-30 | Discharge: 2019-11-30 | Disposition: A | Discharge status: Home / Self Care | Payer: 59 | Attending: Cardiology | Admitting: Cardiology

## 2019-11-30 ENCOUNTER — Ambulatory Visit (HOSPITAL_COMMUNITY): Payer: 59 | Admitting: Anesthesiology

## 2019-11-30 ENCOUNTER — Ambulatory Visit (HOSPITAL_COMMUNITY)
Admission: RE | Admit: 2019-11-30 | Discharge: 2019-11-30 | Disposition: A | Discharge status: Home / Self Care | Payer: 59 | Source: Home / Self Care | Attending: Cardiology | Admitting: Cardiology

## 2019-11-30 ENCOUNTER — Encounter (HOSPITAL_COMMUNITY): Payer: Self-pay | Admitting: Cardiology

## 2019-11-30 DIAGNOSIS — E782 Mixed hyperlipidemia: Secondary | ICD-10-CM | POA: Diagnosis not present

## 2019-11-30 DIAGNOSIS — I428 Other cardiomyopathies: Secondary | ICD-10-CM | POA: Insufficient documentation

## 2019-11-30 DIAGNOSIS — R931 Abnormal findings on diagnostic imaging of heart and coronary circulation: Secondary | ICD-10-CM

## 2019-11-30 DIAGNOSIS — I2510 Atherosclerotic heart disease of native coronary artery without angina pectoris: Secondary | ICD-10-CM | POA: Insufficient documentation

## 2019-11-30 DIAGNOSIS — I472 Ventricular tachycardia: Secondary | ICD-10-CM | POA: Diagnosis not present

## 2019-11-30 DIAGNOSIS — Z87891 Personal history of nicotine dependence: Secondary | ICD-10-CM | POA: Diagnosis not present

## 2019-11-30 DIAGNOSIS — Z95810 Presence of automatic (implantable) cardiac defibrillator: Secondary | ICD-10-CM | POA: Diagnosis not present

## 2019-11-30 DIAGNOSIS — Z79899 Other long term (current) drug therapy: Secondary | ICD-10-CM | POA: Insufficient documentation

## 2019-11-30 DIAGNOSIS — I340 Nonrheumatic mitral (valve) insufficiency: Secondary | ICD-10-CM

## 2019-11-30 HISTORY — PX: TEE WITHOUT CARDIOVERSION: SHX5443

## 2019-11-30 HISTORY — PX: BUBBLE STUDY: SHX6837

## 2019-11-30 SURGERY — ECHOCARDIOGRAM, TRANSESOPHAGEAL
Anesthesia: Monitor Anesthesia Care

## 2019-11-30 MED ORDER — BUTAMBEN-TETRACAINE-BENZOCAINE 2-2-14 % EX AERO
INHALATION_SPRAY | CUTANEOUS | Status: DC | PRN
  Administered 2019-11-30: 2 via TOPICAL

## 2019-11-30 MED ORDER — SODIUM CHLORIDE 0.9 % IV SOLN: INTRAVENOUS | Status: DC

## 2019-11-30 MED ORDER — PROPOFOL 500 MG/50ML IV EMUL
INTRAVENOUS | Status: DC | PRN
  Administered 2019-11-30: 100 ug/kg/min via INTRAVENOUS

## 2019-11-30 MED ORDER — PROPOFOL 10 MG/ML IV BOLUS
INTRAVENOUS | Status: DC | PRN
  Administered 2019-11-30: 20 mg via INTRAVENOUS
  Administered 2019-11-30: 30 mg via INTRAVENOUS
  Administered 2019-11-30: 50 mg via INTRAVENOUS

## 2019-11-30 MED ORDER — LIDOCAINE 2% (20 MG/ML) 5 ML SYRINGE
INTRAMUSCULAR | Status: DC | PRN
  Administered 2019-11-30: 100 mg via INTRAVENOUS

## 2019-11-30 MED ORDER — PHENYLEPHRINE 40 MCG/ML (10ML) SYRINGE FOR IV PUSH (FOR BLOOD PRESSURE SUPPORT)
PREFILLED_SYRINGE | INTRAVENOUS | Status: DC | PRN
  Administered 2019-11-30 (×2): 80 ug via INTRAVENOUS

## 2019-11-30 NOTE — Progress Notes (Signed)
Echocardiogram Echocardiogram Transesophageal has been performed.  Miguel Pena 11/30/2019, 10:11 AM

## 2019-11-30 NOTE — CV Procedure (Signed)
Transesophageal echocardiogram (TEE) : Preliminary report 11/30/19  Sedation: MAC see anesthesia records ( 435 cc propofol and 100cc lidocaine ).   TEE was performed without complications   LV: Normal EF. RV: Normal structure and function.  Pacemaker lead noted no thrombus or vegetation. LA: Moderately dilated.  Spontaneous echo contrast was present.  No thrombus present. Left atrial appendage: Spontaneous echo contrast was present.  No thrombus present. RA: Grossly normal.  Pacemaker lead noted no thrombus or vegetation..   MV: Moderate Regurgitation, no stenosis, no vegetation noted.  TV: Mild Regurgitation, no stenosis, no vegetation noted. AV: Trace Regurgitation, no stenosis, no vegetation noted.   PV: Trace Regurgitation, no stenosis, no vegetation noted.   Thoracic and ascending aorta: Mild plaque.  Spoke to his wife Nyaire Denbleyker on the phone at [phone] and informed her of the TEE findings.  Hold morning doses of Aldactone, Lasix and Entresto as he required phenylephrine during the case and BP soft.  Can take his evening dose of Entresto and resume meds as usually starting 12/01/2019.    Final report forthcoming.  Rex Kras, [place], [facility]  Pager: [phone] Office: [phone]

## 2019-11-30 NOTE — Interval H&P Note (Signed)
History and Physical Interval Note:  11/30/2019 7:44 AM  Miguel Pena  has presented today for surgery, with the diagnosis of CORONARY ARTERY DISEASE.  The various methods of treatment have been discussed with the patient and family. After consideration of risks, benefits and other options for treatment, the patient has consented to  Procedure(s): TRANSESOPHAGEAL ECHOCARDIOGRAM (TEE) (N/A) as a surgical intervention.  The patient's history has been reviewed, patient examined, no change in status, stable for surgery.  I have reviewed the patient's chart and labs.  Questions were answered to the patient's satisfaction.    Last office note 11/03/2019 no changes noted.   After careful review of history and examination, the risks, benefits of transesophageal echocardiogram, and alternatives have been explained to the patient. Complications include but not limited to esophageal perforation (rare), gastrointestinal bleeding (rare), cardiac arrhythmia which can include cardiac arrest and death (rare), pharyngeal irritation / discomfort with swallowing / hematoma, methemoglobinemia, bronchospasm, transient hypoxia, nonsustained ventricular tachycardia, transient atrial fibrillation, minimal hemoptysis, vomiting, hypotension, respiratory compromise, reaction to medications, unavoidable damage to teeth and gums, aspiration pneumonia  were reviewed with the patient.  Patient voices understands, provides verbal feedback, questions answered, and patient wishes to proceed with the procedure.    Ronnesha Mester [name]

## 2019-11-30 NOTE — Transfer of Care (Signed)
Immediate Anesthesia Transfer of Care Note  Patient: Miguel Pena  Procedure(s) Performed: TRANSESOPHAGEAL ECHOCARDIOGRAM (TEE) (N/A ) BUBBLE STUDY  Patient Location: Endo  Anesthesia Type:MAC  Level of Consciousness: drowsy and patient cooperative  Airway & Oxygen Therapy: Patient Spontanous Breathing and Patient connected to nasal cannula oxygen  Post-op Assessment: Report given to RN and Post -op Vital signs reviewed and stable  Post vital signs: Reviewed and stable  Last Vitals:  Vitals Value Taken Time  BP    Temp    Pulse 55 11/30/19 0834  Resp 13 11/30/19 0834  SpO2 99 % 11/30/19 0834  Vitals shown include unvalidated device data.  Last Pain:  Vitals:   11/30/19 0700  TempSrc: Tympanic  PainSc: 0-No pain         Complications: No complications documented.

## 2019-11-30 NOTE — Interval H&P Note (Signed)
History and Physical Interval Note:  11/30/2019 7:43 AM  Miguel Pena  has presented today for surgery, with the diagnosis of CORONARY ARTERY DISEASE.  The various methods of treatment have been discussed with the patient and family. After consideration of risks, benefits and other options for treatment, the patient has consented to  Procedure(s): TRANSESOPHAGEAL ECHOCARDIOGRAM (TEE) (N/A) as a surgical intervention.  The patient's history has been reviewed, patient examined, no change in status, stable for surgery.  I have reviewed the patient's chart and labs.  Questions were answered to the patient's satisfaction.     Latisha Lasch [name]

## 2019-11-30 NOTE — Anesthesia Postprocedure Evaluation (Signed)
Anesthesia Post Note  Patient: Miguel Pena  Procedure(s) Performed: TRANSESOPHAGEAL ECHOCARDIOGRAM (TEE) (N/A ) BUBBLE STUDY     Patient location during evaluation: Endoscopy Anesthesia Type: MAC Level of consciousness: awake and alert Pain management: pain level controlled Vital Signs Assessment: post-procedure vital signs reviewed and stable Respiratory status: spontaneous breathing, nonlabored ventilation, respiratory function stable and patient connected to nasal cannula oxygen Cardiovascular status: blood pressure returned to baseline and stable Postop Assessment: no apparent nausea or vomiting Anesthetic complications: no   No complications documented.  Last Vitals:  Vitals:   11/30/19 0845 11/30/19 0855  BP: (!) 100/58 (!) 99/58  Pulse: 63 (!) 51  Resp: 11 10  Temp:    SpO2: 100% 100%    Last Pain:  Vitals:   11/30/19 0855  TempSrc:   PainSc: 0-No pain                 Darion Juhasz L Kohen Reither

## 2019-11-30 NOTE — Discharge Instructions (Signed)
Transesophageal Echocardiogram Transesophageal echocardiogram (TEE) is a test that uses sound waves to take pictures of your heart. TEE is done by passing a flexible tube down the esophagus. The esophagus is the tube that carries food from the throat to the stomach. The pictures give detailed images of your heart. This can help your doctor see if there are problems with your heart. What happens before the procedure? Staying hydrated Follow instructions from your doctor about hydration, which may include:  Up to 3 hours before the procedure - you may continue to drink clear liquids, such as: ? Water. ? Clear fruit juice. ? Black coffee. ? Plain tea.  Eating and drinking Follow instructions from your doctor about eating and drinking, which may include:  8 hours before the procedure - stop eating heavy meals or foods such as meat, fried foods, or fatty foods.  6 hours before the procedure - stop eating light meals or foods, such as toast or cereal.  6 hours before the procedure - stop drinking milk or drinks that contain milk.  3 hours before the procedure - stop drinking clear liquids. General instructions  You will need to take out any dentures or retainers.  Plan to have someone take you home from the hospital or clinic.  If you will be going home right after the procedure, plan to have someone with you for 24 hours.  Ask your doctor about: ? Changing or stopping your normal medicines. This is important if you take diabetes medicines or blood thinners. ? Taking over-the-counter medicines, vitamins, herbs, and supplements. ? Taking medicines such as aspirin and ibuprofen. These medicines can thin your blood. Do not take these medicines unless your doctor tells you to take them. What happens during the procedure?  To lower your risk of infection, your doctors will wash or clean their hands.  An IV will be put into one of your veins.  You will be given a medicine to help you  relax (sedative).  A medicine may be sprayed or gargled. This numbs the back of your throat.  Your blood pressure, heart rate, and breathing will be watched.  You may be asked to lay on your left side.  A bite block will be placed in your mouth. This keeps you from biting the tube.  The tip of the TEE probe will be placed into the back of your mouth.  You will be asked to swallow.  Your doctor will take pictures of your heart.  The probe and bite block will be taken out. The procedure may vary among doctors and hospitals. What happens after the procedure?   Your blood pressure, heart rate, breathing rate, and blood oxygen level will be watched until the medicines you were given have worn off.  When you first wake up, your throat may feel sore and numb. This will get better over time. You will not be allowed to eat or drink until the numbness has gone away.  Do not drive for 24 hours if you were given a medicine to help you relax. Summary  TEE is a test that uses sound waves to take pictures of your heart.  You will be given a medicine to help you relax.  Do not drive for 24 hours if you were given a medicine to help you relax. This information is not intended to replace advice given to you by your health care provider. Make sure you discuss any questions you have with your health care provider. Document Revised:  09/11/2017 Document Reviewed: 03/26/2016 Elsevier Patient Education  [place].

## 2019-12-04 ENCOUNTER — Encounter (HOSPITAL_COMMUNITY): Payer: Self-pay | Admitting: Cardiology

## 2019-12-08 ENCOUNTER — Telehealth: Payer: Self-pay

## 2019-12-08 ENCOUNTER — Other Ambulatory Visit: Payer: Self-pay

## 2019-12-08 ENCOUNTER — Ambulatory Visit: Payer: 59 | Admitting: Cardiology

## 2019-12-08 DIAGNOSIS — E782 Mixed hyperlipidemia: Secondary | ICD-10-CM

## 2019-12-08 NOTE — Telephone Encounter (Signed)
He is seeing [place] next  year with MP. I prefer you can change to me or if he prefers continue with MP in 6 months and he can remind [place] to place orders then.  JG

## 2019-12-08 NOTE — Telephone Encounter (Signed)
Lovaza 2 gm BID

## 2019-12-08 NOTE — Telephone Encounter (Signed)
Vascepa has been denied by insurance, is there an alternative?

## 2019-12-19 ENCOUNTER — Other Ambulatory Visit: Payer: Self-pay | Admitting: Cardiology

## 2020-01-02 ENCOUNTER — Ambulatory Visit: Payer: 59 | Admitting: Cardiology

## 2020-01-08 LAB — ECHO TEE
AV Mean grad: 6 mmHg
AV Peak grad: 12.5 mmHg
Ao pk vel: 1.77 m/s
Area-P 1/2: 3.43 cm2
MV M vel: 4.12 m/s
MV Peak grad: 67.7 mmHg
P 1/2 time: 561 msec
Radius: 0.4 cm

## 2020-02-07 LAB — BASIC METABOLIC PANEL
BUN/Creatinine Ratio: 8 — ABNORMAL LOW (ref 9–20)
BUN: 8 mg/dL (ref 6–24)
CO2: 24 mmol/L (ref 20–29)
Calcium: 9.8 mg/dL (ref 8.7–10.2)
Chloride: 102 mmol/L (ref 96–106)
Creatinine, Ser: 0.95 mg/dL (ref 0.76–1.27)
GFR calc Af Amer: 104 mL/min/{1.73_m2} (ref >59)
GFR calc non Af Amer: 90 mL/min/{1.73_m2} (ref >59)
Glucose: 113 mg/dL — ABNORMAL HIGH (ref 65–99)
Potassium: 4.3 mmol/L (ref 3.5–5.2)
Sodium: 139 mmol/L (ref 134–144)

## 2020-02-07 LAB — MAGNESIUM: Magnesium: 2.3 mg/dL (ref 1.6–2.3)

## 2020-02-17 ENCOUNTER — Other Ambulatory Visit: Payer: Self-pay | Admitting: Cardiology

## 2020-02-19 ENCOUNTER — Other Ambulatory Visit: Payer: Self-pay | Admitting: Gastroenterology

## 2020-02-20 ENCOUNTER — Other Ambulatory Visit: Payer: Self-pay | Admitting: Cardiology

## 2020-02-20 DIAGNOSIS — E781 Pure hyperglyceridemia: Secondary | ICD-10-CM

## 2020-03-15 ENCOUNTER — Other Ambulatory Visit: Payer: Self-pay | Admitting: Cardiology

## 2020-03-19 ENCOUNTER — Other Ambulatory Visit: Payer: Self-pay

## 2020-03-19 MED ORDER — DICYCLOMINE HCL 10 MG PO CAPS: 10.0000 mg | ORAL_CAPSULE | Freq: Three times a day (TID) | ORAL | 3 refills | Status: DC

## 2020-04-09 ENCOUNTER — Other Ambulatory Visit: Payer: Self-pay | Admitting: Gastroenterology

## 2020-04-10 NOTE — Telephone Encounter (Signed)
Dr Silverio Decamp, Patient is wanting refills of Prednisone request from his pharmacy

## 2020-04-12 NOTE — Telephone Encounter (Signed)
Miguel Pena, please check if he is having recurrent symptoms of Crohn's?  He has been reluctant to start Biologics.  He will need office visit soon either with me or app. We can send him refill for prednisone 40 mg daily for 2 weeks followed by taper by 5 mg every week.  Thank you

## 2020-04-17 ENCOUNTER — Telehealth: Payer: Self-pay | Admitting: Gastroenterology

## 2020-04-17 DIAGNOSIS — K5000 Crohn's disease of small intestine without complications: Secondary | ICD-10-CM

## 2020-04-17 NOTE — Telephone Encounter (Signed)
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 It helps him to work his job.  , now states he doesn't know if it came from something he ate or not. Pain started this morning when he woke up.  He does not want to wait to see if the pain gets worse, he thinks he needs the prednisone now to be able to work  his job

## 2020-04-17 NOTE — Telephone Encounter (Signed)
Patient is calling wanting refills of prednisone, He has a hx of Crohn's disease but the patient has not been seen in our office since 11/2019. He claims to be having some stomach pain and thinks it may be something he ate but wants prednisone incase its a Crohn's flare.   Dr Carlean Purl you are doc of the PM today Please advise thank you

## 2020-04-17 NOTE — Telephone Encounter (Signed)
Called patient and offered him an appointment with Tye Savoy for 9 am tomorrow morning. Patient refused appointment   I told him then if his pain gets worse he will need to be seen in the ED

## 2020-04-17 NOTE — Telephone Encounter (Signed)
He needs to be seen by one of [place] or go to ED

## 2020-04-17 NOTE — Telephone Encounter (Signed)
Please let the patient know I reviewed his chart and though I can understand why he would like to have some prednisone available if it is something he ate this should get better.  I think he should give it some more time.  I am happy to take his call again if he is having persistent issues but I am not going to prescribe prednisone "in case".

## 2020-04-17 NOTE — Telephone Encounter (Signed)
Patient calling for Prednisone refill

## 2020-05-02 ENCOUNTER — Telehealth: Payer: Self-pay

## 2020-05-02 NOTE — Telephone Encounter (Signed)
I am not sure when Vascepa was prescribed. TG looks okay. Okay to stop.   Thanks MJP

## 2020-05-02 NOTE — Telephone Encounter (Signed)
Is he having issues with Vascepa

## 2020-05-02 NOTE — Telephone Encounter (Signed)
He follows up either MP / JG.  Last seen in office by JG and I did the recent TEE.  Have him hold it for now due to stomach upset until we can get back to him.

## 2020-05-02 NOTE — Telephone Encounter (Signed)
I haven't seen him in a while. He was most recently seen by  JG/ST. Can you check with them please?  Thanks MJP

## 2020-05-03 NOTE — Telephone Encounter (Signed)
Called pt no answer, left a vm about message above

## 2020-05-03 NOTE — Telephone Encounter (Signed)
Pt is having stomach pain and upset

## 2020-05-03 NOTE — Telephone Encounter (Signed)
See if he can reduce Vascepa to 1 capsule twice daily to be taken with food

## 2020-05-07 NOTE — Telephone Encounter (Signed)
2nd attempt : Called patient, NA, LMAM.

## 2020-05-17 ENCOUNTER — Other Ambulatory Visit: Payer: Self-pay | Admitting: Cardiology

## 2020-06-01 NOTE — Telephone Encounter (Signed)
Dr Silverio Decamp, Please advise on this patient. He was non compliant with Dr Marla Roe previous recommendations .  I know you are off so you can advise when you can.

## 2020-06-01 NOTE — Telephone Encounter (Signed)
Called patient back, he said that he wanted prednisone for pain. I explained to him that Dr Silverio Decamp is off and Dr Carlean Purl is the Doc of the Day again. That he would certainly be denied because of the incident in April. Pt did not want a appointment with Nevin Bloodgood for the next day.  I told him if he got worse to go to the ED just like last recommendations or if he is ok to wait for Dr Silverio Decamp to get back on Wed for her to review.  He said he would wait for Dr Silverio Decamp

## 2020-06-01 NOTE — Telephone Encounter (Signed)
Patient calling back requesting a refill for Prednisone.  Based on last note, informed patient needs an appt but insisted to send the message.

## 2020-06-06 NOTE — Telephone Encounter (Signed)
Please schedule office visit soon, before sending in Rx for Prednisone will need to check labs as its been a while.  Please order CBC, CMP, CRP, B12, Iron panel and vit D. Thanks

## 2020-06-06 NOTE — Addendum Note (Signed)
Addended by: Oda Kilts on: 06/06/2020 04:20 PM   Modules accepted: Orders

## 2020-06-06 NOTE — Telephone Encounter (Signed)
Called patient to inform he needed labs and the orders are in for him to come in at his convenience and get that done then we would send in prednisone.  I will go ahead and make him a follow up appointment and explained to him the necessity of coming in for an appointment and getting his labs drawn

## 2020-06-11 ENCOUNTER — Other Ambulatory Visit (INDEPENDENT_AMBULATORY_CARE_PROVIDER_SITE_OTHER): Payer: 59

## 2020-06-11 DIAGNOSIS — K5000 Crohn's disease of small intestine without complications: Secondary | ICD-10-CM | POA: Diagnosis not present

## 2020-06-11 LAB — CBC WITH DIFFERENTIAL/PLATELET
Basophils Absolute: 0 10*3/uL (ref 0.0–0.1)
Basophils Relative: 0.5 % (ref 0.0–3.0)
Eosinophils Absolute: 0.1 10*3/uL (ref 0.0–0.7)
Eosinophils Relative: 0.6 % (ref 0.0–5.0)
HCT: 29.8 % — ABNORMAL LOW (ref 39.0–52.0)
Hemoglobin: 10.4 g/dL — ABNORMAL LOW (ref 13.0–17.0)
Lymphocytes Relative: 11.5 % — ABNORMAL LOW (ref 12.0–46.0)
Lymphs Abs: 1.2 10*3/uL (ref 0.7–4.0)
MCHC: 34.8 g/dL (ref 30.0–36.0)
MCV: 87 fl (ref 78.0–100.0)
Monocytes Absolute: 0.5 10*3/uL (ref 0.1–1.0)
Monocytes Relative: 5.1 % (ref 3.0–12.0)
Neutro Abs: 8.8 10*3/uL — ABNORMAL HIGH (ref 1.4–7.7)
Neutrophils Relative %: 82.3 % — ABNORMAL HIGH (ref 43.0–77.0)
Platelets: 225 10*3/uL (ref 150.0–400.0)
RBC: 3.42 Mil/uL — ABNORMAL LOW (ref 4.22–5.81)
RDW: 15.1 % (ref 11.5–15.5)
WBC: 10.7 10*3/uL — ABNORMAL HIGH (ref 4.0–10.5)

## 2020-06-11 LAB — VITAMIN D 25 HYDROXY (VIT D DEFICIENCY, FRACTURES): VITD: 31.04 ng/mL (ref 30.00–100.00)

## 2020-06-11 LAB — VITAMIN B12: Vitamin B-12: 456 pg/mL (ref 211–911)

## 2020-06-12 ENCOUNTER — Other Ambulatory Visit: Payer: Self-pay | Admitting: Cardiology

## 2020-06-12 LAB — IBC PANEL
Iron: 50 ug/dL (ref 42–165)
Saturation Ratios: 15.8 % — ABNORMAL LOW (ref 20.0–50.0)
Transferrin: 226 mg/dL (ref 212.0–360.0)

## 2020-06-12 LAB — COMPREHENSIVE METABOLIC PANEL
ALT: 7 U/L (ref 0–53)
AST: 11 U/L (ref 0–37)
Albumin: 4.1 g/dL (ref 3.5–5.2)
Alkaline Phosphatase: 64 U/L (ref 39–117)
BUN: 15 mg/dL (ref 6–23)
CO2: 24 mEq/L (ref 19–32)
Calcium: 9.7 mg/dL (ref 8.4–10.5)
Chloride: 101 mEq/L (ref 96–112)
Creatinine, Ser: 0.9 mg/dL (ref 0.40–1.50)
GFR: 95.65 mL/min (ref >60.00)
Glucose, Bld: 130 mg/dL — ABNORMAL HIGH (ref 70–99)
Potassium: 4.1 mEq/L (ref 3.5–5.1)
Sodium: 136 mEq/L (ref 135–145)
Total Bilirubin: 1.2 mg/dL (ref 0.2–1.2)
Total Protein: 6.9 g/dL (ref 6.0–8.3)

## 2020-06-12 LAB — HIGH SENSITIVITY CRP: CRP, High Sensitivity: 77.15 mg/L — ABNORMAL HIGH (ref 0.000–5.000)

## 2020-06-12 MED ORDER — PREDNISONE 10 MG PO TABS: ORAL_TABLET | ORAL | 0 refills | Status: DC

## 2020-06-12 NOTE — Telephone Encounter (Signed)
Inbound call from patient. States he had labs drawn on 06/11/20. And requesting prescription to be sent to [facility] on [facility].

## 2020-06-12 NOTE — Telephone Encounter (Signed)
Prednisone sent today

## 2020-06-12 NOTE — Telephone Encounter (Signed)
Please send Rx for Prednisone 47m daily X 2 weeks and decreased by [id number] 2 week. Please schedule for follow up office visit soon.

## 2020-06-12 NOTE — Telephone Encounter (Signed)
Dr Silverio Decamp How do you want to prescribe his prednisone

## 2020-06-12 NOTE — Addendum Note (Signed)
Addended by: Oda Kilts on: 06/12/2020 03:32 PM   Modules accepted: Orders

## 2020-06-16 ENCOUNTER — Other Ambulatory Visit: Payer: Self-pay | Admitting: Gastroenterology

## 2020-08-06 ENCOUNTER — Telehealth: Payer: Self-pay | Admitting: Gastroenterology

## 2020-08-06 ENCOUNTER — Other Ambulatory Visit: Payer: Self-pay

## 2020-08-06 ENCOUNTER — Other Ambulatory Visit (INDEPENDENT_AMBULATORY_CARE_PROVIDER_SITE_OTHER): Payer: 59

## 2020-08-06 DIAGNOSIS — K5000 Crohn's disease of small intestine without complications: Secondary | ICD-10-CM

## 2020-08-06 LAB — IBC PANEL
Iron: 43 ug/dL (ref 42–165)
Saturation Ratios: 12.6 % — ABNORMAL LOW (ref 20.0–50.0)
Transferrin: 244 mg/dL (ref 212.0–360.0)

## 2020-08-06 LAB — CBC WITH DIFFERENTIAL/PLATELET
Basophils Absolute: 0 10*3/uL (ref 0.0–0.1)
Basophils Relative: 0.4 % (ref 0.0–3.0)
Eosinophils Absolute: 0 10*3/uL (ref 0.0–0.7)
Eosinophils Relative: 0.2 % (ref 0.0–5.0)
HCT: 32.1 % — ABNORMAL LOW (ref 39.0–52.0)
Hemoglobin: 10.9 g/dL — ABNORMAL LOW (ref 13.0–17.0)
Lymphocytes Relative: 12.8 % (ref 12.0–46.0)
Lymphs Abs: 1.3 10*3/uL (ref 0.7–4.0)
MCHC: 34 g/dL (ref 30.0–36.0)
MCV: 87.1 fl (ref 78.0–100.0)
Monocytes Absolute: 0.4 10*3/uL (ref 0.1–1.0)
Monocytes Relative: 3.7 % (ref 3.0–12.0)
Neutro Abs: 8.2 10*3/uL — ABNORMAL HIGH (ref 1.4–7.7)
Neutrophils Relative %: 82.9 % — ABNORMAL HIGH (ref 43.0–77.0)
Platelets: 276 10*3/uL (ref 150.0–400.0)
RBC: 3.69 Mil/uL — ABNORMAL LOW (ref 4.22–5.81)
RDW: 15.5 % (ref 11.5–15.5)
WBC: 9.9 10*3/uL (ref 4.0–10.5)

## 2020-08-06 LAB — COMPREHENSIVE METABOLIC PANEL
ALT: 8 U/L (ref 0–53)
AST: 11 U/L (ref 0–37)
Albumin: 4.1 g/dL (ref 3.5–5.2)
Alkaline Phosphatase: 62 U/L (ref 39–117)
BUN: 22 mg/dL (ref 6–23)
CO2: 23 mEq/L (ref 19–32)
Calcium: 10.5 mg/dL (ref 8.4–10.5)
Chloride: 102 mEq/L (ref 96–112)
Creatinine, Ser: 1.68 mg/dL — ABNORMAL HIGH (ref 0.40–1.50)
GFR: 45.18 mL/min — ABNORMAL LOW (ref >60.00)
Glucose, Bld: 119 mg/dL — ABNORMAL HIGH (ref 70–99)
Potassium: 4.5 mEq/L (ref 3.5–5.1)
Sodium: 137 mEq/L (ref 135–145)
Total Bilirubin: 1.4 mg/dL — ABNORMAL HIGH (ref 0.2–1.2)
Total Protein: 7.3 g/dL (ref 6.0–8.3)

## 2020-08-06 LAB — VITAMIN B12: Vitamin B-12: 753 pg/mL (ref 211–911)

## 2020-08-06 LAB — SEDIMENTATION RATE: Sed Rate: 26 mm/hr — ABNORMAL HIGH (ref 0–20)

## 2020-08-06 NOTE — Telephone Encounter (Signed)
Spoke with the patient. He is very reluctant to take the 11:20 am appointment. States he has to meet the [employer] people at his house at 1 pm. He lives 40 minutes from the office and is concerned. Encouraged the patient to come get labs drawn today. Then he can leave and go straight home after his appointment tomorrow. Patient agrees to this plan. He states understanding that prednisone will not be prescribed today and that he must see Dr Silverio Decamp tomorrow to discuss treatment.

## 2020-08-06 NOTE — Telephone Encounter (Signed)
He was last seen in November 2021.  He canceled the appointments since then. He has been on and off prednisone in the past few months, he is steroid-dependent and will benefit from biologic therapy.  We will need to discuss this further during office visit.  Patient needs to understand the potential risks with on and off long-term steroid therapy.  Please check stool C. difficile, GI pathogen panel.  CBC, CMP, TB QuantiFERON, ESR, B12 and iron panel.  Please schedule office visit soon with me or app We will plan to send prescription for prednisone 40 mg daily and taper down by 5 mg every week once the labs are resulted. Thank you

## 2020-08-06 NOTE — Telephone Encounter (Signed)
Spoke with the patient. He is not on Humira. He is off the prednisone and is starting to have pain in his lower abdomen. He said his bowel movements are not normal. Admits to diarrhea. He states, "it's just not good." He is scheduled for follow up 09/12/20. He is concerned the Humira will "just be a hassle." Do you want to repeat his labs?  He wants prednisone. Please advise.

## 2020-08-06 NOTE — Telephone Encounter (Signed)
Inbound call from patient requesting to get a script for Humira please.

## 2020-08-07 ENCOUNTER — Encounter: Payer: Self-pay | Admitting: Gastroenterology

## 2020-08-07 ENCOUNTER — Ambulatory Visit (INDEPENDENT_AMBULATORY_CARE_PROVIDER_SITE_OTHER): Payer: 59 | Admitting: Gastroenterology

## 2020-08-07 VITALS — BP 90/54 | HR 60 | Ht 68.5 in | Wt 172.5 lb

## 2020-08-07 DIAGNOSIS — K50818 Crohn's disease of both small and large intestine with other complication: Secondary | ICD-10-CM | POA: Diagnosis not present

## 2020-08-07 DIAGNOSIS — Z9225 Personal history of immunosupression therapy: Secondary | ICD-10-CM

## 2020-08-07 DIAGNOSIS — K5090 Crohn's disease, unspecified, without complications: Secondary | ICD-10-CM

## 2020-08-07 DIAGNOSIS — F1920 Other psychoactive substance dependence, uncomplicated: Secondary | ICD-10-CM | POA: Diagnosis not present

## 2020-08-07 DIAGNOSIS — K219 Gastro-esophageal reflux disease without esophagitis: Secondary | ICD-10-CM | POA: Diagnosis not present

## 2020-08-07 MED ORDER — PREDNISONE 10 MG PO TABS: ORAL_TABLET | ORAL | 0 refills | Status: DC

## 2020-08-07 MED ORDER — PREDNISONE 5 MG PO TABS: ORAL_TABLET | ORAL | 0 refills | Status: DC

## 2020-08-07 MED ORDER — PANTOPRAZOLE SODIUM 40 MG PO TBEC: 40.0000 mg | DELAYED_RELEASE_TABLET | Freq: Every day | ORAL | 3 refills | Status: DC

## 2020-08-07 NOTE — Patient Instructions (Addendum)
We have sent in Prednisone, pantoprazole and dicyclomine  refills to your pharmacy  We will contact you Eustaquio Maize) about starting your Humira   Due to recent changes in healthcare laws, you may see the results of your imaging and laboratory studies on MyChart before your provider has had a chance to review them.  We understand that in some cases there may be results that are confusing or concerning to you. Not all laboratory results come back in the same time frame and the provider may be waiting for multiple results in order to interpret others.  Please give [place] 48 hours in order for your provider to thoroughly review all the results before contacting the office for clarification of your results.    If you are age 69 or older, your body mass index should be between 23-30. Your Body mass index is 25.85 kg/m. If this is out of the aforementioned range listed, please consider follow up with your Primary Care Provider.  If you are age 72 or younger, your body mass index should be between 19-25. Your Body mass index is 25.85 kg/m. If this is out of the aformentioned range listed, please consider follow up with your Primary Care Provider.   __________________________________________________________  The [facility] providers would like to encourage you to use [facility] to communicate with providers for non-urgent requests or questions.  Due to long hold times on the telephone, sending your provider a message by [facility] may be a faster and more efficient way to get a response.  Please allow 48 business hours for a response.  Please remember that this is for non-urgent requests.    I appreciate the  opportunity to care for you  Thank You   Harl Bowie , MD

## 2020-08-07 NOTE — Progress Notes (Signed)
Miguel Pena    [id number]    10/17/1965  Primary Care Physician:Elkins, Curt Jews, MD  Referring Physician: [name], MD [address] [place],  [place] [address]   Chief complaint:  Crohn's   HPI:  55 year old very pleasant gentleman with history of CHF, CAD on Plavix, follow-up visit for Crohn's disease.   He completed prednisone taper 2 weeks ago.  He is requiring intermittent prednisone.  Since he stopped prednisone he feels his symptoms are recurring and he is having abdominal discomfort, worse after eating.  He is currently taking 6-MP 50 mg daily.  He has not started Humira injections yet, was initially worried about side effects and then insurance coverage.  He is willing to take it at this point.    Denies any diarrhea, constipation, melena or blood in stool   He has changed his diet and lost weight intentionally  Had dental extraction 2 days ago, has not been eating or drinking much for past few days.  He has been feeling lightheaded since yesterday.   He is s/p CRT-D March 2019.     Relevant GI history: EGD December 06, 2018: LA grade C esophagitis and gastritis.  H. pylori negative   Colonoscopy December 06, 2018: Terminal ileum ulceration with ileitis, biopsies consistent with IBD.  Diverticulosis and internal hemorrhoids otherwise normal exam.   Capsule endoscopy October 22, 2018: Multiple aphthous ulcers and inflammation throughout small bowel, predominantly in the distal small bowel consistent with Crohn's ileitis     03/10/2017 EGD : LA grade C reflux and candidiasis esophagitis, benign-appearing esophageal stenosis, gastritis with hemorrhage, duodenal erosions without bleeding otherwise normal.  Started on Nexium 40 twice daily.      03/10/2017  Colonoscopy : moderate diverticulosis in the sigmoid and descending colon, narrowing of the colon in association with diverticular opening, peridiverticular erythema, 1 5 mm polyp in the  sigmoid colon and nonbleeding internal hemorrhoids.   Pathology showed chronic gastritis and reflux esophagitis negative for H. pylori or dysplasia.  Polyp removed was hyperplastic.  Recall colonoscopy recommended in 10 years.      CT abdomen pelvis with contrast 09/29/2018 with inflammatory changes and thickening of the distal and terminal ileum which may represent infectious enteritis or an inflammatory bowel disease.  No bowel obstruction, normal appendix.  Sigmoid diverticulosis and cholelithiasis.        Outpatient Encounter Medications as of 08/07/2020  Medication Sig   amoxicillin (AMOXIL) 500 MG tablet Take 500 mg by mouth 3 (three) times daily.   bisoprolol (ZEBETA) 5 MG tablet TAKE 1 TABLET BY MOUTH EVERY DAY   chlorhexidine (PERIDEX) 0.12 % solution 1 mL by Mouth Rinse route 2 (two) times daily.   dicyclomine (BENTYL) 10 MG capsule Take 1 capsule (10 mg total) by mouth 3 (three) times daily. Take at noon, 4pm & evening   diphenhydrAMINE (BENADRYL) 25 MG tablet Take 25 mg by mouth every 6 (six) hours as needed.   ENTRESTO 97-103 MG TAKE 1 TABLET BY MOUTH TWICE DAILY   esomeprazole (NEXIUM) 20 MG capsule Take 1 capsule (20 mg total) by mouth daily at 12 noon.   furosemide (LASIX) 40 MG tablet Take 1 tablet (40 mg total) by mouth as needed for edema. (Patient taking differently: Take 40 mg by mouth daily as needed for edema.)   Homeopathic Products (ZICAM ALLERGY RELIEF NA) Place 1 spray into the nose daily as needed (allergies).   HYDROcodone-acetaminophen (  NORCO) 10-325 MG tablet Take 1 tablet by mouth every 4 (four) hours as needed for moderate pain.    mercaptopurine (PURINETHOL) 50 MG tablet TAKE 1 TABLET BY MOUTH DAILY ON AN EMPTY STOMACH 1 HOUR BEFORE OR 2 HOURS AFTER MEALS   Multiple Vitamins-Minerals (HAIR/SKIN/NAILS) CAPS Take 2 capsules by mouth daily.   nicotine polacrilex (NICORETTE) 4 MG gum Take 4 mg by mouth as needed for smoking cessation.   omega-3 acid ethyl esters  (LOVAZA) 1 g capsule Take 2 g by mouth 2 (two) times daily.   potassium chloride SA (KLOR-CON) 20 MEQ tablet TAKE 1 TABLET BY MOUTH DAILY   predniSONE (DELTASONE) 10 MG tablet TAKE 2 TABLET BY MOUTH DAILY FOR 14 DAYS THEN DECREASE BY 5 MG EVERY TWO WEEKS   rosuvastatin (CRESTOR) 40 MG tablet TAKE 1 TABLET(40 MG) BY MOUTH DAILY   spironolactone (ALDACTONE) 25 MG tablet TAKE 1 TABLET BY MOUTH EVERY DAY   HUMIRA PEN 40 MG/0.4ML PNKT Inject 40 mg into the skin every 14 (fourteen) days. Begin on day 29 then every 14 days maintenance dose Dx K50.00 (Patient not taking: Reported on 08/07/2020)   No facility-administered encounter medications on file as of 08/07/2020.    Allergies as of 08/07/2020   (No Known Allergies)    Past Medical History:  Diagnosis Date   Bursitis of right shoulder    CHF (congestive heart failure) ([place])    [name] 10/24/2016   Chronic combined systolic and diastolic heart failure ([place]) 03/16/2017   Dilated cardiomyopathy ([place])    [name] 10/24/2016   Dysrhythmia 1995   "VT" per patient- states had neg stress test and no problems since   Encounter for adjustment or management of automatic implantable cardioverter-defibrillator 06/17/2018   Encounter for assessment of automatic implantable cardioverter-defibrillator (AICD) 06/17/2018   Heart murmur    "when I was young"   History of kidney stones    Hypercholesterolemia    ICD: Medtronic MRI Quad CRTD (Bi-V ICD) implantation 03/16/2017 [name] 03/16/2017   Scheduled Remote ICD check   8.7.20: No VHR episodes. No mode switches. Normal health trends. Trans-thoracic impedance trends and the OptiVol Fluid Index do no present significant abnormalities. Battery longevity is 6.5-8.3 years. RA pacing is 8.3 %, RV pacing is 99.9 %, and LV pacing is 99.9 %.  Clinic: 05/18/17   NICM (nonischemic cardiomyopathy) ([place]) 03/16/2017    Past Surgical History:  Procedure Laterality Date   BIOPSY  12/06/2018   Procedure: BIOPSY;  Surgeon:  Mauri Pole, MD;  Location: [facility];  Service: Endoscopy;;  Egd and Colon   BIV ICD INSERTION CRT-D N/A 03/16/2017   Procedure: BIV ICD INSERTION CRT-D;  Surgeon: Deboraha Sprang, MD;  Location: [facility];  Service: Cardiovascular;  Laterality: N/A;   BUBBLE STUDY  11/30/2019   Procedure: BUBBLE STUDY;  Surgeon: Rex Kras, DO;  Location: [facility];  Service: Cardiovascular;;   COLONOSCOPY WITH PROPOFOL N/A 03/10/2017   Procedure: COLONOSCOPY WITH PROPOFOL;  Surgeon: Mauri Pole, MD;  Location: [facility];  Service: Endoscopy;  Laterality: N/A;   COLONOSCOPY WITH PROPOFOL N/A 12/06/2018   Procedure: COLONOSCOPY WITH PROPOFOL;  Surgeon: Mauri Pole, MD;  Location: [facility];  Service: Endoscopy;  Laterality: N/A;   CORONARY ANGIOPLASTY WITH STENT PLACEMENT  10/28/2016   CORONARY STENT INTERVENTION N/A 10/28/2016   Procedure: CORONARY STENT INTERVENTION;  Surgeon: [name], MD;  Location: [facility];  Service: Cardiovascular;  Laterality: N/A;  ESOPHAGOGASTRODUODENOSCOPY (EGD) WITH PROPOFOL N/A 03/10/2017   Procedure: ESOPHAGOGASTRODUODENOSCOPY (EGD) WITH PROPOFOL;  Surgeon: Mauri Pole, MD;  [facility]: [facility];  Service: Endoscopy;  Laterality: N/A;   ESOPHAGOGASTRODUODENOSCOPY (EGD) WITH PROPOFOL N/A 12/06/2018   Procedure: ESOPHAGOGASTRODUODENOSCOPY (EGD) WITH PROPOFOL;  Surgeon: Mauri Pole, MD;  Location: [facility];  Service: Endoscopy;  Laterality: N/A;   INGUINAL HERNIA REPAIR Right 02/16/2013   Procedure: RIGHT INGUINAL HERNIA REPAIR ;  Surgeon: [name], MD;  Location: [facility];  Service: General;  Laterality: Right;  With MESH   INGUINAL HERNIA REPAIR Right 02/16/2013   INTRAVASCULAR PRESSURE WIRE/FFR STUDY N/A 10/28/2016   Procedure: INTRAVASCULAR PRESSURE WIRE/FFR STUDY;  Surgeon: [name], MD;  Location: [facility];  Service: Cardiovascular;  Laterality: N/A;   RIGHT/LEFT  HEART CATH AND CORONARY ANGIOGRAPHY N/A 10/28/2016   Procedure: RIGHT/LEFT HEART CATH AND CORONARY ANGIOGRAPHY;  Surgeon: [name], MD;  Location: [facility];  Service: Cardiovascular;  Laterality: N/A;   TEE WITHOUT CARDIOVERSION N/A 11/30/2019   Procedure: TRANSESOPHAGEAL ECHOCARDIOGRAM (TEE);  Surgeon: Rex Kras, DO;  Location: [facility];  Service: Cardiovascular;  Laterality: N/A;   ULTRASOUND GUIDANCE FOR VASCULAR ACCESS  10/28/2016   Procedure: Ultrasound Guidance For Vascular Access;  Surgeon: [name], MD;  Location: [facility];  Service: Cardiovascular;;    Family History  Problem Relation Age of Onset   Heart attack Father    Colon polyps Father    Colon polyps Brother    Colon cancer Neg Hx    Stomach cancer Neg Hx    Pancreatic cancer Neg Hx    Esophageal cancer Neg Hx    Liver disease Neg Hx     Social History   Socioeconomic History   Marital status: Married    Spouse name: Not on file   Number of children: 2   Years of education: Not on file   Highest education level: Not on file  Occupational History   Occupation: Emergency planning/management officer    Comment: [employer]  Tobacco Use   Smoking status: Former    Packs/day: 1.00    Years: 12.00    Pack years: 12.00    Types: Cigarettes, Cigars    Quit date: 02/16/2011    Years since quitting: 9.4   Smokeless tobacco: Former    Types: Snuff   Tobacco comments:    10/28/2016 'chewed for a couple years after I quit smoking"  Vaping Use   Vaping Use: Never used  Substance and Sexual Activity   Alcohol use: No   Drug use: No   Sexual activity: Not Currently  Other Topics Concern   Not on file  Social History Narrative   Not on file   Social Determinants of Health   Financial Resource Strain: Not on file  Food Insecurity: Not on file  Transportation Needs: Not on file  Physical Activity: Not on file  Stress: Not on file  Social Connections: Not on file  Intimate  Partner Violence: Not on file      Review of systems: All other review of systems negative except as mentioned in the HPI.   Physical Exam: Vitals:   08/07/20 1124  BP: (!) 90/54  Pulse: 60   Body mass index is 25.85 kg/m. Gen:      No acute distress HEENT:  sclera anicteric Abd:      soft, non-tender; no palpable masses, no distension Ext:    No edema Neuro:  alert and oriented x 3 Psych: normal mood and affect  Data Reviewed:  Reviewed labs, radiology imaging, old records and pertinent past GI work up   Assessment and Plan/Recommendations:  55 year old very pleasant gentleman with history of CAD, CHF s/p CRT-D, GERD and Crohn's disease   Lightheadedness secondary to dehydration, he has orthostatic hypotension Advised patient to increase fluid intake to maintain hydration. He will need to come to ER if his symptoms persist or worsen.  Crohn's disease: He is steroid-dependent, has recurrent symptoms once steroids are tapered off. Will send prescription for prednisone 20 mg daily for 2 weeks and taper by 5 mg every week  He has been reluctant to start Humira, discussed in detail the potential side effects with long-term steroid use.  Patient is willing to start using Humira, will need to get preapproval from insurance because the prior prescription has expired Continue low-dose 6-MP 50 mg daily  Abdominal discomfort: Use dicyclomine 10 mg every 8 hours as needed   GERD: Use pantoprazole 40 mg daily Continue antireflux measures  Return in 4-6 weeks  The patient was provided an opportunity to ask questions and all were answered. The patient agreed with the plan and demonstrated an understanding of the instructions.  Damaris Hippo , MD    CC: [name], *

## 2020-08-08 LAB — QUANTIFERON-TB GOLD PLUS
Mitogen-NIL: >10 IU/mL
NIL: 0.04 IU/mL
QuantiFERON-TB Gold Plus: NEGATIVE
TB1-NIL: 0 IU/mL
TB2-NIL: 0 IU/mL

## 2020-08-08 LAB — IRON,TIBC AND FERRITIN PANEL
%SAT: 14 % (calc) — ABNORMAL LOW (ref 20–48)
Ferritin: 161 ng/mL (ref 38–380)
Iron: 42 ug/dL — ABNORMAL LOW (ref 50–180)
TIBC: 299 mcg/dL (calc) (ref 250–425)

## 2020-08-09 ENCOUNTER — Encounter: Payer: Self-pay | Admitting: Gastroenterology

## 2020-08-10 ENCOUNTER — Telehealth: Payer: Self-pay

## 2020-08-10 NOTE — Telephone Encounter (Signed)
Miguel Pena [place]: [id number] - PA Case ID: [id number] Cover My Meds

## 2020-08-14 ENCOUNTER — Other Ambulatory Visit: Payer: Self-pay

## 2020-08-14 NOTE — Telephone Encounter (Signed)
[employer] [facility] denied Humira based on "no clinical evidence of response" to Humira. It was noted on the most recent PA request that the patient did not strt the Humira when it was last approved. I have sent an appeal to [facility] and asked that it be expedited. A letter has been mailed to the patient to let him know of the denial and of the appeal.

## 2020-08-16 ENCOUNTER — Other Ambulatory Visit: Payer: Self-pay | Admitting: *Deleted

## 2020-08-16 MED ORDER — DICYCLOMINE HCL 10 MG PO CAPS: 10.0000 mg | ORAL_CAPSULE | Freq: Three times a day (TID) | ORAL | 3 refills | Status: DC

## 2020-08-21 NOTE — Telephone Encounter (Signed)
Resubmitted the appeal to the fax number supplied by insurance.

## 2020-08-22 ENCOUNTER — Other Ambulatory Visit: Payer: Self-pay

## 2020-08-22 MED ORDER — HUMIRA (2 PEN) 40 MG/0.4ML ~~LOC~~ AJKT: 40.0000 mg | AUTO-INJECTOR | SUBCUTANEOUS | 11 refills | Status: DC

## 2020-08-22 NOTE — Telephone Encounter (Signed)
Humira approved. New prescription sent to [facility] for Humira starter kit using the pharmacy form and a new Rx for the Humira maintenance (40 mg/0.4 ml) printed and faxed to pharmacy.

## 2020-09-12 ENCOUNTER — Ambulatory Visit: Payer: 59 | Admitting: Gastroenterology

## 2020-09-12 ENCOUNTER — Encounter: Payer: Self-pay | Admitting: Gastroenterology

## 2020-09-12 ENCOUNTER — Other Ambulatory Visit (INDEPENDENT_AMBULATORY_CARE_PROVIDER_SITE_OTHER): Payer: 59

## 2020-09-12 ENCOUNTER — Ambulatory Visit (INDEPENDENT_AMBULATORY_CARE_PROVIDER_SITE_OTHER): Payer: 59 | Admitting: Gastroenterology

## 2020-09-12 VITALS — BP 110/66 | HR 59 | Ht 70.0 in | Wt 184.0 lb

## 2020-09-12 DIAGNOSIS — K5000 Crohn's disease of small intestine without complications: Secondary | ICD-10-CM

## 2020-09-12 LAB — HIGH SENSITIVITY CRP: CRP, High Sensitivity: 0.8 mg/L (ref 0.000–5.000)

## 2020-09-12 LAB — CBC WITH DIFFERENTIAL/PLATELET
Basophils Absolute: 0 10*3/uL (ref 0.0–0.1)
Basophils Relative: 0.3 % (ref 0.0–3.0)
Eosinophils Absolute: 0 10*3/uL (ref 0.0–0.7)
Eosinophils Relative: 0.1 % (ref 0.0–5.0)
HCT: 37.2 % — ABNORMAL LOW (ref 39.0–52.0)
Hemoglobin: 12.2 g/dL — ABNORMAL LOW (ref 13.0–17.0)
Lymphocytes Relative: 12.6 % (ref 12.0–46.0)
Lymphs Abs: 1 10*3/uL (ref 0.7–4.0)
MCHC: 32.7 g/dL (ref 30.0–36.0)
MCV: 91.6 fl (ref 78.0–100.0)
Monocytes Absolute: 0.2 10*3/uL (ref 0.1–1.0)
Monocytes Relative: 2.5 % — ABNORMAL LOW (ref 3.0–12.0)
Neutro Abs: 6.7 10*3/uL (ref 1.4–7.7)
Neutrophils Relative %: 84.5 % — ABNORMAL HIGH (ref 43.0–77.0)
Platelets: 218 10*3/uL (ref 150.0–400.0)
RBC: 4.06 Mil/uL — ABNORMAL LOW (ref 4.22–5.81)
RDW: 17.1 % — ABNORMAL HIGH (ref 11.5–15.5)
WBC: 7.9 10*3/uL (ref 4.0–10.5)

## 2020-09-12 LAB — COMPREHENSIVE METABOLIC PANEL
ALT: 29 U/L (ref 0–53)
AST: 23 U/L (ref 0–37)
Albumin: 4.3 g/dL (ref 3.5–5.2)
Alkaline Phosphatase: 46 U/L (ref 39–117)
BUN: 13 mg/dL (ref 6–23)
CO2: 26 mEq/L (ref 19–32)
Calcium: 10.3 mg/dL (ref 8.4–10.5)
Chloride: 104 mEq/L (ref 96–112)
Creatinine, Ser: 0.81 mg/dL (ref 0.40–1.50)
GFR: 98.57 mL/min (ref >60.00)
Glucose, Bld: 120 mg/dL — ABNORMAL HIGH (ref 70–99)
Potassium: 5 mEq/L (ref 3.5–5.1)
Sodium: 138 mEq/L (ref 135–145)
Total Bilirubin: 1.3 mg/dL — ABNORMAL HIGH (ref 0.2–1.2)
Total Protein: 6.9 g/dL (ref 6.0–8.3)

## 2020-09-12 MED ORDER — PREDNISONE 5 MG PO TABS: ORAL_TABLET | ORAL | 0 refills | Status: DC

## 2020-09-12 NOTE — Patient Instructions (Signed)
Your provider has requested that you go to the basement level for lab work before leaving today. Press "B" on the elevator. The lab is located at the first door on the left as you exit the elevator.   We will send Prednisone to your pharmacy  Prednisone Taper:  5 mg daily for 2 weeks then decrease to every other day for 2 weeks   Due to recent changes in healthcare laws, you may see the results of your imaging and laboratory studies on MyChart before your provider has had a chance to review them.  We understand that in some cases there may be results that are confusing or concerning to you. Not all laboratory results come back in the same time frame and the provider may be waiting for multiple results in order to interpret others.  Please give [place] 48 hours in order for your provider to thoroughly review all the results before contacting the office for clarification of your results.    If you are age 5 or older, your body mass index should be between 23-30. Your Body mass index is 26.4 kg/m. If this is out of the aforementioned range listed, please consider follow up with your Primary Care Provider.  If you are age 25 or younger, your body mass index should be between 19-25. Your Body mass index is 26.4 kg/m. If this is out of the aformentioned range listed, please consider follow up with your Primary Care Provider.   __________________________________________________________  The [facility] providers would like to encourage you to use [facility] to communicate with providers for non-urgent requests or questions.  Due to long hold times on the telephone, sending your provider a message by [facility] may be a faster and more efficient way to get a response.  Please allow 48 business hours for a response.  Please remember that this is for non-urgent requests.    I appreciate the  opportunity to care for you  Thank You   Harl Bowie , MD

## 2020-09-12 NOTE — Progress Notes (Signed)
Miguel Pena    [id number]    June 09, 1965  Primary Care Physician:Elkins, Curt Jews, MD  Referring Physician: [name], MD [address] [place],  [place] [address]   Chief complaint:  Crohn's disease  HPI:  55 year old very pleasant gentleman with history of CHF, CAD on Plavix, follow-up visit for Crohn's disease.  He started Humira 2 weeks ago, feels better He is on 6-MP and has tapered to prednisone 96m daily   Denies any diarrhea, constipation, melena or blood in stool   He is s/p CRT-D March 2019.     Relevant GI history: EGD December 06, 2018: LA grade C esophagitis and gastritis.  H. pylori negative   Colonoscopy December 06, 2018: Terminal ileum ulceration with ileitis, biopsies consistent with IBD.  Diverticulosis and internal hemorrhoids otherwise normal exam.   Capsule endoscopy October 22, 2018: Multiple aphthous ulcers and inflammation throughout small bowel, predominantly in the distal small bowel consistent with Crohn's ileitis     03/10/2017 EGD : LA grade C reflux and candidiasis esophagitis, benign-appearing esophageal stenosis, gastritis with hemorrhage, duodenal erosions without bleeding otherwise normal.  Started on Nexium 40 twice daily.      03/10/2017  Colonoscopy : moderate diverticulosis in the sigmoid and descending colon, narrowing of the colon in association with diverticular opening, peridiverticular erythema, 1 5 mm polyp in the sigmoid colon and nonbleeding internal hemorrhoids.   Pathology showed chronic gastritis and reflux esophagitis negative for H. pylori or dysplasia.  Polyp removed was hyperplastic.  Recall colonoscopy recommended in 10 years.      CT abdomen pelvis with contrast 09/29/2018 with inflammatory changes and thickening of the distal and terminal ileum which may represent infectious enteritis or an inflammatory bowel disease.  No bowel obstruction, normal appendix.  Sigmoid diverticulosis and  cholelithiasis.     Outpatient Encounter Medications as of 09/12/2020  Medication Sig   amoxicillin (AMOXIL) 500 MG tablet Take 500 mg by mouth 3 (three) times daily.   bisoprolol (ZEBETA) 5 MG tablet TAKE 1 TABLET BY MOUTH EVERY DAY   chlorhexidine (PERIDEX) 0.12 % solution 1 mL by Mouth Rinse route 2 (two) times daily.   dicyclomine (BENTYL) 10 MG capsule Take 1 capsule (10 mg total) by mouth 3 (three) times daily. Take at noon, 4pm & evening   diphenhydrAMINE (BENADRYL) 25 MG tablet Take 25 mg by mouth every 6 (six) hours as needed.   ENTRESTO 97-103 MG TAKE 1 TABLET BY MOUTH TWICE DAILY   esomeprazole (NEXIUM) 20 MG capsule Take 1 capsule (20 mg total) by mouth daily at 12 noon.   furosemide (LASIX) 40 MG tablet Take 1 tablet (40 mg total) by mouth as needed for edema. (Patient taking differently: Take 40 mg by mouth daily as needed for edema.)   Homeopathic Products (ZICAM ALLERGY RELIEF NA) Place 1 spray into the nose daily as needed (allergies).   HUMIRA PEN 40 MG/0.4ML PNKT Inject 40 mg into the skin every 14 (fourteen) days. Begin on day 29 then every 14 days maintenance dose Dx K50.919   HYDROcodone-acetaminophen (NORCO) 10-325 MG tablet Take 1 tablet by mouth every 4 (four) hours as needed for moderate pain.    mercaptopurine (PURINETHOL) 50 MG tablet TAKE 1 TABLET BY MOUTH DAILY ON AN EMPTY STOMACH 1 HOUR BEFORE OR 2 HOURS AFTER MEALS   Multiple Vitamins-Minerals (HAIR/SKIN/NAILS) CAPS Take 2 capsules by mouth daily.   nicotine polacrilex (NICORETTE) 4 MG gum  Take 4 mg by mouth as needed for smoking cessation.   omega-3 acid ethyl esters (LOVAZA) 1 g capsule Take 2 g by mouth 2 (two) times daily.   pantoprazole (PROTONIX) 40 MG tablet Take 1 tablet (40 mg total) by mouth daily. Before breakfast   potassium chloride SA (KLOR-CON) 20 MEQ tablet TAKE 1 TABLET BY MOUTH DAILY   predniSONE (DELTASONE) 10 MG tablet Take 20 mg daily for 2 weeks then decrease by 5's every 2 weeks    predniSONE (DELTASONE) 5 MG tablet Take 5 mg tablet daily for 2 weeks   rosuvastatin (CRESTOR) 40 MG tablet TAKE 1 TABLET(40 MG) BY MOUTH DAILY   spironolactone (ALDACTONE) 25 MG tablet TAKE 1 TABLET BY MOUTH EVERY DAY   No facility-administered encounter medications on file as of 09/12/2020.    Allergies as of 09/12/2020   (No Known Allergies)    Past Medical History:  Diagnosis Date   Bursitis of right shoulder    CHF (congestive heart failure) ([place])    [name] 10/24/2016   Chronic combined systolic and diastolic heart failure ([place]) 03/16/2017   Dilated cardiomyopathy ([place])    [name] 10/24/2016   Dysrhythmia 1995   "VT" per patient- states had neg stress test and no problems since   Encounter for adjustment or management of automatic implantable cardioverter-defibrillator 06/17/2018   Encounter for assessment of automatic implantable cardioverter-defibrillator (AICD) 06/17/2018   Heart murmur    "when I was young"   History of kidney stones    Hypercholesterolemia    ICD: Medtronic MRI Quad CRTD (Bi-V ICD) implantation 03/16/2017 [name] 03/16/2017   Scheduled Remote ICD check   8.7.20: No VHR episodes. No mode switches. Normal health trends. Trans-thoracic impedance trends and the OptiVol Fluid Index do no present significant abnormalities. Battery longevity is 6.5-8.3 years. RA pacing is 8.3 %, RV pacing is 99.9 %, and LV pacing is 99.9 %.  Clinic: 05/18/17   NICM (nonischemic cardiomyopathy) ([place]) 03/16/2017    Past Surgical History:  Procedure Laterality Date   BIOPSY  12/06/2018   Procedure: BIOPSY;  Surgeon: Mauri Pole, MD;  Location: [facility];  Service: Endoscopy;;  Egd and Colon   BIV ICD INSERTION CRT-D N/A 03/16/2017   Procedure: BIV ICD INSERTION CRT-D;  Surgeon: Deboraha Sprang, MD;  Location: [facility];  Service: Cardiovascular;  Laterality: N/A;   BUBBLE STUDY  11/30/2019   Procedure: BUBBLE STUDY;  Surgeon: Rex Kras, DO;  Location: [facility];   Service: Cardiovascular;;   COLONOSCOPY WITH PROPOFOL N/A 03/10/2017   Procedure: COLONOSCOPY WITH PROPOFOL;  Surgeon: Mauri Pole, MD;  Location: [facility];  Service: Endoscopy;  Laterality: N/A;   COLONOSCOPY WITH PROPOFOL N/A 12/06/2018   Procedure: COLONOSCOPY WITH PROPOFOL;  Surgeon: Mauri Pole, MD;  Location: [facility];  Service: Endoscopy;  Laterality: N/A;   CORONARY ANGIOPLASTY WITH STENT PLACEMENT  10/28/2016   CORONARY STENT INTERVENTION N/A 10/28/2016   Procedure: CORONARY STENT INTERVENTION;  Surgeon: [name], MD;  Location: [facility];  Service: Cardiovascular;  Laterality: N/A;   ESOPHAGOGASTRODUODENOSCOPY (EGD) WITH PROPOFOL N/A 03/10/2017   Procedure: ESOPHAGOGASTRODUODENOSCOPY (EGD) WITH PROPOFOL;  Surgeon: Mauri Pole, MD;  [facility]: [facility];  Service: Endoscopy;  Laterality: N/A;   ESOPHAGOGASTRODUODENOSCOPY (EGD) WITH PROPOFOL N/A 12/06/2018   Procedure: ESOPHAGOGASTRODUODENOSCOPY (EGD) WITH PROPOFOL;  Surgeon: Mauri Pole, MD;  [facility]: [facility];  Service: Endoscopy;  Laterality: N/A;   INGUINAL HERNIA REPAIR Right 02/16/2013  Procedure: RIGHT INGUINAL HERNIA REPAIR ;  Surgeon: [name], MD;  Location: [facility];  Service: General;  Laterality: Right;  With MESH   INGUINAL HERNIA REPAIR Right 02/16/2013   INTRAVASCULAR PRESSURE WIRE/FFR STUDY N/A 10/28/2016   Procedure: INTRAVASCULAR PRESSURE WIRE/FFR STUDY;  Surgeon: [name], MD;  Location: [facility];  Service: Cardiovascular;  Laterality: N/A;   RIGHT/LEFT HEART CATH AND CORONARY ANGIOGRAPHY N/A 10/28/2016   Procedure: RIGHT/LEFT HEART CATH AND CORONARY ANGIOGRAPHY;  Surgeon: [name], MD;  Location: [facility];  Service: Cardiovascular;  Laterality: N/A;   TEE WITHOUT CARDIOVERSION N/A 11/30/2019   Procedure: TRANSESOPHAGEAL ECHOCARDIOGRAM (TEE);  Surgeon: Rex Kras, DO;  Location: [facility];  Service:  Cardiovascular;  Laterality: N/A;   ULTRASOUND GUIDANCE FOR VASCULAR ACCESS  10/28/2016   Procedure: Ultrasound Guidance For Vascular Access;  Surgeon: [name], MD;  Location: [facility];  Service: Cardiovascular;;    Family History  Problem Relation Age of Onset   Heart attack Father    Colon polyps Father    Colon polyps Brother    Colon cancer Neg Hx    Stomach cancer Neg Hx    Pancreatic cancer Neg Hx    Esophageal cancer Neg Hx    Liver disease Neg Hx     Social History   Socioeconomic History   Marital status: Married    Spouse name: Not on file   Number of children: 2   Years of education: Not on file   Highest education level: Not on file  Occupational History   Occupation: Emergency planning/management officer    Comment: [employer]  Tobacco Use   Smoking status: Former    Packs/day: 1.00    Years: 12.00    Pack years: 12.00    Types: Cigarettes, Cigars    Quit date: 02/16/2011    Years since quitting: 9.5   Smokeless tobacco: Former    Types: Snuff   Tobacco comments:    10/28/2016 'chewed for a couple years after I quit smoking"  Vaping Use   Vaping Use: Never used  Substance and Sexual Activity   Alcohol use: No   Drug use: No   Sexual activity: Not Currently  Other Topics Concern   Not on file  Social History Narrative   Not on file   Social Determinants of Health   Financial Resource Strain: Not on file  Food Insecurity: Not on file  Transportation Needs: Not on file  Physical Activity: Not on file  Stress: Not on file  Social Connections: Not on file  Intimate Partner Violence: Not on file      Review of systems: All other review of systems negative except as mentioned in the HPI.   Physical Exam: Vitals:   09/12/20 1040  BP: 110/66  Pulse: (!) 59   Body mass index is 26.4 kg/m. Gen:      No acute distress HEENT:  sclera anicteric Abd:      soft, non-tender; no palpable masses, no distension Ext:    No edema Neuro:  alert and oriented x 3 Psych: normal mood and affect  Data Reviewed:  Reviewed labs, radiology imaging, old records and pertinent past GI work up   Assessment and Plan/Recommendations:  55 year old very pleasant gentleman with history of CAD, CHF s/p CRT-D, GERD and Crohn's disease   Crohn's disease: Continue Humira Continue prednisone 5 mg daily for additional 2 weeks and then taper to every other day  for 2 more weeks He is steroid-dependent, has recurrent symptoms once steroids are tapered off. Continue low-dose 6-MP 50 mg daily Follow-up CBC, CMP and CRP  Abdominal discomfort: Use dicyclomine 10 mg every 8 hours as needed   GERD: Use pantoprazole 40 mg daily Continue antireflux measures   Return in 4-6 weeks  This visit required 30 minutes of patient care (this includes precharting, chart review, review of results, face-to-face time used for counseling as well as treatment plan and follow-up. The patient was provided an opportunity to ask questions and all were answered. The patient agreed with the plan and demonstrated an understanding of the instructions.  Damaris Hippo , MD    CC: [name], *

## 2020-09-13 ENCOUNTER — Encounter: Payer: Self-pay | Admitting: Gastroenterology

## 2020-10-16 ENCOUNTER — Other Ambulatory Visit: Payer: Self-pay | Admitting: Gastroenterology

## 2020-10-31 ENCOUNTER — Ambulatory Visit: Payer: 59 | Admitting: Cardiology

## 2020-11-08 ENCOUNTER — Ambulatory Visit: Payer: 59 | Admitting: Cardiology

## 2020-11-08 ENCOUNTER — Other Ambulatory Visit: Payer: Self-pay

## 2020-11-08 ENCOUNTER — Encounter: Payer: Self-pay | Admitting: Cardiology

## 2020-11-08 ENCOUNTER — Ambulatory Visit: Payer: 59 | Admitting: Gastroenterology

## 2020-11-08 VITALS — BP 108/71 | HR 52 | Temp 97.8°F | Resp 16 | Ht 70.0 in | Wt 187.0 lb

## 2020-11-08 DIAGNOSIS — I340 Nonrheumatic mitral (valve) insufficiency: Secondary | ICD-10-CM

## 2020-11-08 DIAGNOSIS — Z95810 Presence of automatic (implantable) cardiac defibrillator: Secondary | ICD-10-CM

## 2020-11-08 DIAGNOSIS — R072 Precordial pain: Secondary | ICD-10-CM

## 2020-11-08 DIAGNOSIS — I5042 Chronic combined systolic (congestive) and diastolic (congestive) heart failure: Secondary | ICD-10-CM

## 2020-11-08 DIAGNOSIS — Z4502 Encounter for adjustment and management of automatic implantable cardiac defibrillator: Secondary | ICD-10-CM

## 2020-11-08 DIAGNOSIS — I2510 Atherosclerotic heart disease of native coronary artery without angina pectoris: Secondary | ICD-10-CM

## 2020-11-08 DIAGNOSIS — I428 Other cardiomyopathies: Secondary | ICD-10-CM

## 2020-11-08 MED ORDER — ASPIRIN EC 81 MG PO TBEC: 81.0000 mg | DELAYED_RELEASE_TABLET | Freq: Every day | ORAL | 3 refills | Status: DC

## 2020-11-08 NOTE — Progress Notes (Signed)
Follow up visit  Subjective:   Miguel Pena, male    DOB: 1965/12/04, 54 y.o.   MRN: [id number]   HPI   Chief Complaint  Patient presents with   Coronary Artery Disease   Non-stress Test   NICM   Follow-up    1 YEAR   55 y/o Caucasian male with nonischemic dilated cardiomyopathy with now mormalization of LVEF post CRT-D placement 03/2017, single vessel CAD s/p successful PCI to OM1 with 3.5 X 16 mm DES 10/2016, hyperlipidemia, prior smoking history, testosterone deficiency, Chron's disease  Patient is doing well, denies any chest pain, shortness of breath, edema symptoms. He is compliant with his medical therapy. Two days ago, he heard a sound from his device for about 15 secs. Following this, he started noticing, what he thought was left-sided muscle spasm on his chest.  He denies any significant pain, but complains of discomfort.  Current Outpatient Medications on File Prior to Visit  Medication Sig Dispense Refill   amoxicillin (AMOXIL) 500 MG tablet Take 500 mg by mouth 3 (three) times daily.     bisoprolol (ZEBETA) 5 MG tablet TAKE 1 TABLET BY MOUTH EVERY DAY 90 tablet 3   chlorhexidine (PERIDEX) 0.12 % solution 1 mL by Mouth Rinse route 2 (two) times daily.     dicyclomine (BENTYL) 10 MG capsule Take 1 capsule (10 mg total) by mouth 3 (three) times daily. Take at noon, 4pm & evening 90 capsule 3   diphenhydrAMINE (BENADRYL) 25 MG tablet Take 25 mg by mouth every 6 (six) hours as needed.     ENTRESTO 97-103 MG TAKE 1 TABLET BY MOUTH TWICE DAILY 180 tablet 3   esomeprazole (NEXIUM) 20 MG capsule Take 1 capsule (20 mg total) by mouth daily at 12 noon. 30 capsule 11   furosemide (LASIX) 40 MG tablet Take 1 tablet (40 mg total) by mouth as needed for edema. (Patient taking differently: Take 40 mg by mouth daily as needed for edema.) 30 tablet 0   Homeopathic Products (ZICAM ALLERGY RELIEF NA) Place 1 spray into the nose daily as needed (allergies).     HUMIRA PEN 40 MG/0.4ML PNKT  Inject 40 mg into the skin every 14 (fourteen) days. Begin on day 29 then every 14 days maintenance dose Dx K50.919 2 each 11   HYDROcodone-acetaminophen (NORCO) 10-325 MG tablet Take 1 tablet by mouth every 4 (four) hours as needed for moderate pain.      mercaptopurine (PURINETHOL) 50 MG tablet TAKE 1 TABLET BY MOUTH DAILY 1 HOUR BEFORE OR 2 HOURS AFTER MEALS ON AN EMPTY STOMACH 30 tablet 6   Multiple Vitamins-Minerals (HAIR/SKIN/NAILS) CAPS Take 2 capsules by mouth daily.     nicotine polacrilex (NICORETTE) 4 MG gum Take 4 mg by mouth as needed for smoking cessation.     omega-3 acid ethyl esters (LOVAZA) 1 g capsule Take 2 g by mouth 2 (two) times daily.     pantoprazole (PROTONIX) 40 MG tablet Take 1 tablet (40 mg total) by mouth daily. Before breakfast 90 tablet 3   potassium chloride SA (KLOR-CON) 20 MEQ tablet TAKE 1 TABLET BY MOUTH DAILY 90 tablet 3   predniSONE (DELTASONE) 10 MG tablet Take 20 mg daily for 2 weeks then decrease by 5's every 2 weeks 60 tablet 0   predniSONE (DELTASONE) 5 MG tablet Take 5 mg tablet daily for 2 weeks then decrease to every other day for 2 weeks 30 tablet 0   rosuvastatin (CRESTOR) 40 MG  tablet TAKE 1 TABLET(40 MG) BY MOUTH DAILY 90 tablet 3   spironolactone (ALDACTONE) 25 MG tablet TAKE 1 TABLET BY MOUTH EVERY DAY 90 tablet 1   No current facility-administered medications on file prior to visit.    Cardiovascular & other pertient studies:  Scheduled in person CRT-D evaluation 11/08/2020: Presenting: A sensed, BiV paced.  Sinus bradycardia at 55 bpm.  Lead impedance and thresholds within normal limits for RA lead, LV lead threshold has increased to 6 V since last pacemaker transmission on 10/18/2020.  Longevity 2 years and 11 months. Pacer dependent: No, underlying sinus bradycardia at 55 bpm. AP 8%, VP 98.7%. There were no AMS episodes.  There were no high ventricular rate episodes. Thoracic impedance does not suggest fluid overload state. Observations:  Normal CRT-D function.  Patient having pectoral muscle stimulation with pacing. Observation: Increased LV lead output to 6 V.   Changes: Vector changed from LV 1 to LV2 to RVCoil output with resultant decrease in output from 6 V to 2.25 V.  By this changes, longevity increased from 2 years 33month to 3.7 years.  EKG 11/08/2020: Sinus rhythm with ventricular pacing  Remote BiV ICD transmission 10/18/2020: AP 8%, CRT 98%.  Lead impedance and thresholds normal.  Longevity 2 years 11 months.  There were no mode switches, there was no high ventricular rate episodes.  Ventricular sensing episode = PVC.  Thoracic impedance is at baseline.  Normal CRT-D function.  TEE 11/30/2019:  1. Left ventricular ejection fraction, by estimation, is 55 to 60%. The  left ventricle has normal function. The left ventricle has no regional  wall motion abnormalities. There is mild left ventricular hypertrophy.  Left ventricular diastolic function could not be evaluated.   2. No thrombus or vegetation seen on the endocardial wires. Right  ventricular systolic function is normal. The right ventricular size is  normal. A endocardial wires within right chambers is visualized.   3. Left atrial size was moderately dilated. No left atrial/left atrial  appendage thrombus was detected. The LAA emptying velocity was 56 cm/s.   4. The mitral valve is grossly normal. Mild mitral valve regurgitation.  No evidence of mitral stenosis. There is mild prolapse of posterior  leaflet of the mitral valve.   5. The aortic valve is tricuspid. Aortic valve regurgitation is trivial.  No aortic stenosis is present.   6. Agitated saline contrast bubble study was negative, with no evidence  of any interatrial shunt.   Echocardiogram 11/15/2019:  Normal LV systolic function with visual EF 50-55%. Left ventricle cavity  is normal in size. Mild left ventricular hypertrophy. Unable to evaluate  diastolic function due to mitral annular  calcification. Normal LAP.  Endocardial wires noted within the right cardiac chambers.  Echo dense structure noted on the ICD lead within the right ventricle  differential diagnosis includes but not limited to: thrombus, vegetation,  reverberation artifact. It is more prominent compared to prior study from  09/2017.  Left atrial cavity is moderately dilated.  Trace aortic regurgitation.  Native mitral valve with mitral annular calcification and prolapse of  posterior leaflet with an eccentric jet directed along the anterior mitral  valve leaflet. Moderate (Grade II) mitral regurgitation.  Mild tricuspid regurgitation. No pulmonary hypertension, RVSP 261mg.  Compared to prior study dated 09/22/2017: No significant changes except  the echo dense structure noted on ICD lead as discussed above.  Recommended transesophageal echocardiogram, if clinically indicated.   Recent labs: 09/12/2020: Glucose 120, BUN/Cr 13/0.81. EGFR 98. Na/K  138/5.0. Rest of the CMP normal H/H 12/27. MCV 91. Platelets 218  11/2019: Chol 95, TG 161, HDL 36, LDL 31   Review of Systems  Cardiovascular:  Negative for chest pain, dyspnea on exertion, leg swelling, palpitations and syncope.       Chest wall discomfort and "muscle spasms"        Vitals:   11/08/20 1329  BP: 108/71  Pulse: (!) 52  Resp: 16  Temp: 97.8 F (36.6 C)  SpO2: 97%    Body mass index is 26.83 kg/m. Filed Weights   11/08/20 1329  Weight: 187 lb (84.8 kg)     Objective:   Physical Exam Vitals and nursing note reviewed.  Constitutional:      General: He is not in acute distress. Neck:     Vascular: No JVD.  Cardiovascular:     Rate and Rhythm: Normal rate and regular rhythm.     Heart sounds: Normal heart sounds. No murmur heard.    Comments: Visible and palpable chest wall palpitations with every heartbeat Pulmonary:     Effort: Pulmonary effort is normal.     Breath sounds: Normal breath sounds. No wheezing or rales.   Musculoskeletal:     Right lower leg: No edema.     Left lower leg: No edema.          Assessment & Recommendations:   55 y/o Caucasian male with nonischemic dilated cardiomyopathy with now mormalization of LVEF post CRT-D placement 03/2017, single vessel CAD s/p successful PCI to OM1 with 3.5 X 16 mm DES 10/2016, hyperlipidemia, prior smoking history, testosterone deficiency, Chron's disease  Nonischemic cardiomyopathy: Clinically compensated. No CHF on ICD monitoring.  Continue bisoprolol to 5 mg daily, Entresto 97-103 mg bid, Spironolactone 25 mg daily. Resting HR <70 bpm. Corlanor not indicated.  EF normalized to 55% on above medical therapy (Echo 09/2017).   Mitral regurgitation: Mild, with posterior leaflet prolapse. No significant murmur.  Repeat echocardiogram in 11/2020  CAD: Stable with no angina symnptoms. Conitnue statin. Looks like he is not on any antiplatelet therapy at this time. Recommen Aspirin 81 mg daily. Lipids well controlled.  Scheduled in person CRT-D evaluation 11/08/2020: Presenting: A sensed, BiV paced.  Sinus bradycardia at 55 bpm.  Lead impedance and thresholds within normal limits for RA lead, LV lead threshold has increased to 6 V since last pacemaker transmission on 10/18/2020.  Longevity 2 years and 11 months. Pacer dependent: No, underlying sinus bradycardia at 55 bpm. AP 8%, VP 98.7%. There were no AMS episodes.  There were no high ventricular rate episodes. Thoracic impedance does not suggest fluid overload state. Observations: Normal CRT-D function.  Patient having pectoral muscle stimulation with pacing. Observation: Increased LV lead output to 6 V.   Changes: Vector changed from LV 1 to LV2 to RVCoil output with resultant decrease in output from 6 V to 2.25 V.  By this changes, longevity increased from 2 years 34month to 3.7 years.  Will get chest Xray and refer to EP.   Time spent: 45 min    MNigel Mormon MD Pager:  [phone]: [phone]

## 2020-11-09 DIAGNOSIS — R072 Precordial pain: Secondary | ICD-10-CM | POA: Insufficient documentation

## 2020-11-13 ENCOUNTER — Other Ambulatory Visit: Payer: Self-pay | Admitting: Cardiology

## 2020-12-03 ENCOUNTER — Institutional Professional Consult (permissible substitution): Payer: 59 | Admitting: Internal Medicine

## 2020-12-03 DIAGNOSIS — I5042 Chronic combined systolic (congestive) and diastolic (congestive) heart failure: Secondary | ICD-10-CM

## 2020-12-03 DIAGNOSIS — I447 Left bundle-branch block, unspecified: Secondary | ICD-10-CM

## 2020-12-03 DIAGNOSIS — I428 Other cardiomyopathies: Secondary | ICD-10-CM

## 2020-12-03 DIAGNOSIS — Z95810 Presence of automatic (implantable) cardiac defibrillator: Secondary | ICD-10-CM

## 2020-12-13 ENCOUNTER — Other Ambulatory Visit: Payer: Self-pay | Admitting: Gastroenterology

## 2020-12-13 NOTE — Telephone Encounter (Signed)
refill 

## 2021-01-03 ENCOUNTER — Other Ambulatory Visit: Payer: Self-pay

## 2021-01-03 ENCOUNTER — Encounter: Payer: Self-pay | Admitting: Internal Medicine

## 2021-01-03 ENCOUNTER — Ambulatory Visit
Admission: RE | Admit: 2021-01-03 | Discharge: 2021-01-03 | Disposition: A | Discharge status: Home / Self Care | Payer: 59 | Source: Ambulatory Visit | Attending: Internal Medicine | Admitting: Internal Medicine

## 2021-01-03 ENCOUNTER — Ambulatory Visit (INDEPENDENT_AMBULATORY_CARE_PROVIDER_SITE_OTHER): Payer: 59 | Admitting: Internal Medicine

## 2021-01-03 VITALS — BP 106/64 | HR 51 | Ht 72.0 in | Wt 193.0 lb

## 2021-01-03 DIAGNOSIS — I428 Other cardiomyopathies: Secondary | ICD-10-CM

## 2021-01-03 DIAGNOSIS — Z95810 Presence of automatic (implantable) cardiac defibrillator: Secondary | ICD-10-CM | POA: Diagnosis not present

## 2021-01-03 DIAGNOSIS — I5020 Unspecified systolic (congestive) heart failure: Secondary | ICD-10-CM

## 2021-01-03 DIAGNOSIS — T82110A Breakdown (mechanical) of cardiac electrode, initial encounter: Secondary | ICD-10-CM

## 2021-01-03 MED ORDER — FUROSEMIDE 40 MG PO TABS: 40.0000 mg | ORAL_TABLET | ORAL | 3 refills | Status: DC

## 2021-01-03 NOTE — Patient Instructions (Signed)
Medication Instructions:  Your physician has recommended you make the following change in your medication:   ** Begin Furosemide 58m - 1 tablet by mouth daily x 5 days then 1 tablet by mouth every other day.  *If you need a refill on your cardiac medications before your next appointment, please call your pharmacy*   Lab Work: None ordered.  If you have labs (blood work) drawn today and your tests are completely normal, you will receive your results only by: [place](if you have MyChart) OR A paper copy in the mail If you have any lab test that is abnormal or we need to change your treatment, we will call you to review the results.   Testing/Procedures: A chest x-ray takes a picture of the organs and structures inside the chest, including the heart, lungs, and blood vessels. This test can show several things, including, whether the heart is enlarges; whether fluid is building up in the lungs; and whether pacemaker / defibrillator leads are still in place.   [facility] [place]  Follow-Up: [facility] you and your health needs are our priority.  As part of our continuing mission to provide you with exceptional heart care, we have created designated Provider Care Teams.  These Care Teams include your primary Cardiologist (physician) and Advanced Practice Providers (APPs -  Physician Assistants and Nurse Practitioners) who all work together to provide you with the care you need, when you need it.  We recommend signing up for the patient portal called "MyChart".  Sign up information is provided on this After Visit Summary.  MyChart is used to connect with patients for Virtual Visits (Telemedicine).  Patients are able to view lab/test results, encounter notes, upcoming appointments, etc.  Non-urgent messages can be sent to your provider as well.   To learn more about what you can do with MyChart, go to [email or web address]    Your next  appointment:   To be determined

## 2021-01-03 NOTE — Progress Notes (Signed)
ELECTROPHYSIOLOGY CONSULT NOTE  Patient ID: Miguel Pena, MRN: [id number], DOB/AGE: 04/25/1965 55 y.o. Admit date: (Not on file) Date of Consult: 01/03/2021  Primary Physician: [name], MD Primary Cardiologist: MPat     Miguel Pena is a 55 y.o. male who is being seen today for the evaluation of phrenic nerve stimulation and increased threshold in LV lead ae, at the request of Dr. Virgina Jock MD. He has a Hx of congestive heart failure, chronic combined systolic and diastolic heart failure, dilated cardiomyopathy,dysrhythmia, heart murmur, and hypercholesterolemia.    HPI Miguel Pena is a 55 y.o. male referred back by Dr. Wonda Amis because of detected elevation in the LV threshold following CRT implantation 3/19 ([facility]) for ischemic cardiomyopathy in the setting of left bundle branch block.  Not seen by [place] following implant  Significant interval improvement in LV function with CRT (See Below)   ECG 3/19 (postop) QR in lead I and upright QRS lead V1 ECG 12/20 upright QRS lead I and RS in lead V1 ECG 11/22 upright QRS lead I and negative QRS lead V1    Today,   He says he was fine until a couple of days prior to the visit 11/22; he then noted diaphragmatic stimulation.  He was seen by Dr. Virgina Jock whose note I cannot really decipher but there was an issue of LV threshold that were very high in the output was decreased.  Once in a while he states that he experiences dizziness, but his breathing has been normal.   patient denies chest pain, shortness of breath, nocturnal dyspnea, orthopnea and edema there have been no palpitations, lightheadedness or syncope.   DATE TEST EF    10/18 Cath   % DES OM1  2/19 Echo  20% Mod-severe MR    11/21 Echo  50%    1/22 Echo TEE 55-60% Mild LVH            Date Cr K Hgb  9/22 0.81 5.0 12.2           Past Medical History:  Diagnosis Date   Bursitis of right shoulder    CHF (congestive heart failure) ([place])    [name]  10/24/2016   Chronic combined systolic and diastolic heart failure ([place]) 03/16/2017   Dilated cardiomyopathy ([place])    [name] 10/24/2016   Dysrhythmia 1995   "VT" per patient- states had neg stress test and no problems since   Encounter for adjustment or management of automatic implantable cardioverter-defibrillator 06/17/2018   Encounter for assessment of automatic implantable cardioverter-defibrillator (AICD) 06/17/2018   Heart murmur    "when I was young"   History of kidney stones    Hypercholesterolemia    ICD: Medtronic MRI Quad CRTD (Bi-V ICD) implantation 03/16/2017 [name] 03/16/2017   Scheduled Remote ICD check   8.7.20: No VHR episodes. No mode switches. Normal health trends. Trans-thoracic impedance trends and the OptiVol Fluid Index do no present significant abnormalities. Battery longevity is 6.5-8.3 years. RA pacing is 8.3 %, RV pacing is 99.9 %, and LV pacing is 99.9 %.  Clinic: 05/18/17   NICM (nonischemic cardiomyopathy) ([place]) 03/16/2017      Surgical History:  Past Surgical History:  Procedure Laterality Date   BIOPSY  12/06/2018   Procedure: BIOPSY;  Surgeon: Mauri Pole, MD;  [facility];  Service: Endoscopy;;  Egd and Colon   BIV ICD INSERTION CRT-D N/A 03/16/2017   Procedure: BIV ICD INSERTION CRT-D;  Surgeon: [name],  Revonda Standard, MD;  Location: [facility];  Service: Cardiovascular;  Laterality: N/A;   BUBBLE STUDY  11/30/2019   Procedure: BUBBLE STUDY;  Surgeon: Rex Kras, DO;  Location: [facility];  Service: Cardiovascular;;   COLONOSCOPY WITH PROPOFOL N/A 03/10/2017   Procedure: COLONOSCOPY WITH PROPOFOL;  Surgeon: Mauri Pole, MD;  [facility];  Service: Endoscopy;  Laterality: N/A;   COLONOSCOPY WITH PROPOFOL N/A 12/06/2018   Procedure: COLONOSCOPY WITH PROPOFOL;  Surgeon: Mauri Pole, MD;  [facility];  Service: Endoscopy;  Laterality: N/A;   CORONARY ANGIOPLASTY WITH STENT PLACEMENT  10/28/2016    CORONARY STENT INTERVENTION N/A 10/28/2016   Procedure: CORONARY STENT INTERVENTION;  Surgeon: [name], MD;  Location: [facility];  Service: Cardiovascular;  Laterality: N/A;   ESOPHAGOGASTRODUODENOSCOPY (EGD) WITH PROPOFOL N/A 03/10/2017   Procedure: ESOPHAGOGASTRODUODENOSCOPY (EGD) WITH PROPOFOL;  Surgeon: Mauri Pole, MD;  [facility];  Service: Endoscopy;  Laterality: N/A;   ESOPHAGOGASTRODUODENOSCOPY (EGD) WITH PROPOFOL N/A 12/06/2018   Procedure: ESOPHAGOGASTRODUODENOSCOPY (EGD) WITH PROPOFOL;  Surgeon: Mauri Pole, MD;  [facility];  Service: Endoscopy;  Laterality: N/A;   INGUINAL HERNIA REPAIR Right 02/16/2013   Procedure: RIGHT INGUINAL HERNIA REPAIR ;  Surgeon: [name], MD;  [facility];  Service: General;  Laterality: Right;  With MESH   INGUINAL HERNIA REPAIR Right 02/16/2013   INTRAVASCULAR PRESSURE WIRE/FFR STUDY N/A 10/28/2016   Procedure: INTRAVASCULAR PRESSURE WIRE/FFR STUDY;  Surgeon: [name], MD;  Location: [facility];  Service: Cardiovascular;  Laterality: N/A;   RIGHT/LEFT HEART CATH AND CORONARY ANGIOGRAPHY N/A 10/28/2016   Procedure: RIGHT/LEFT HEART CATH AND CORONARY ANGIOGRAPHY;  Surgeon: [name], MD;  Location: [facility] [facility];  Service: Cardiovascular;  Laterality: N/A;   TEE WITHOUT CARDIOVERSION N/A 11/30/2019   Procedure: TRANSESOPHAGEAL ECHOCARDIOGRAM (TEE);  Surgeon: Rex Kras, DO;  Location: [facility];  Service: Cardiovascular;  Laterality: N/A;   ULTRASOUND GUIDANCE FOR VASCULAR ACCESS  10/28/2016   Procedure: Ultrasound Guidance For Vascular Access;  Surgeon: [name], MD;  Location: [facility];  Service: Cardiovascular;;     Home Meds: Current Meds  Medication Sig   amoxicillin (AMOXIL) 500 MG tablet Take 500 mg by mouth 3 (three) times daily.   aspirin EC 81 MG tablet Take 1 tablet (81 mg total) by mouth daily. Swallow whole.    bisoprolol (ZEBETA) 5 MG tablet TAKE 1 TABLET BY MOUTH EVERY DAY   chlorhexidine (PERIDEX) 0.12 % solution 1 mL by Mouth Rinse route 2 (two) times daily.   dicyclomine (BENTYL) 10 MG capsule TAKE ONE CAPSULE BY MOUTH THREE TIMES DAILY(NOON, 4:00 PM AND EVENING)   diphenhydrAMINE (BENADRYL) 25 MG tablet Take 25 mg by mouth every 6 (six) hours as needed.   ENTRESTO 97-103 MG TAKE 1 TABLET BY MOUTH TWICE DAILY   furosemide (LASIX) 40 MG tablet Take 1 tablet (40 mg total) by mouth as needed for edema. (Patient taking differently: Take 40 mg by mouth daily as needed for edema.)   Homeopathic Products (ZICAM ALLERGY RELIEF NA) Place 1 spray into the nose daily as needed (allergies).   HUMIRA PEN 40 MG/0.4ML PNKT Inject 40 mg into the skin every 14 (fourteen) days. Begin on day 29 then every 14 days maintenance dose Dx K50.919   HYDROcodone-acetaminophen (NORCO) 10-325 MG tablet Take 1 tablet by mouth every 4 (four) hours as needed for moderate pain.    mercaptopurine (  PURINETHOL) 50 MG tablet TAKE 1 TABLET BY MOUTH DAILY 1 HOUR BEFORE OR 2 HOURS AFTER MEALS ON AN EMPTY STOMACH   Multiple Vitamins-Minerals (HAIR/SKIN/NAILS) CAPS Take 2 capsules by mouth daily.   nicotine polacrilex (NICORETTE) 4 MG gum Take 4 mg by mouth as needed for smoking cessation.   omega-3 acid ethyl esters (LOVAZA) 1 g capsule Take 2 g by mouth 2 (two) times daily.   pantoprazole (PROTONIX) 40 MG tablet Take 1 tablet (40 mg total) by mouth daily. Before breakfast   potassium chloride SA (KLOR-CON) 20 MEQ tablet TAKE 1 TABLET BY MOUTH DAILY   predniSONE (DELTASONE) 5 MG tablet Take 5 mg tablet daily for 2 weeks then decrease to every other day for 2 weeks   rosuvastatin (CRESTOR) 40 MG tablet TAKE 1 TABLET(40 MG) BY MOUTH DAILY   spironolactone (ALDACTONE) 25 MG tablet TAKE 1 TABLET BY MOUTH EVERY DAY    Allergies: No Known Allergies  Social History   Socioeconomic History   Marital status: Married    Spouse name: Not on  file   Number of children: 2   Years of education: Not on file   Highest education level: Not on file  Occupational History   Occupation: Emergency planning/management officer    Comment: [employer]  Tobacco Use   Smoking status: Former    Packs/day: 1.00    Years: 12.00    Pack years: 12.00    Types: Cigarettes, Cigars    Quit date: 02/16/2011    Years since quitting: 9.8   Smokeless tobacco: Former    Types: Snuff   Tobacco comments:    10/28/2016 'chewed for a couple years after I quit smoking"  Vaping Use   Vaping Use: Never used  Substance and Sexual Activity   Alcohol use: No   Drug use: No   Sexual activity: Not Currently  Other Topics Concern   Not on file  Social History Narrative   Not on file   Social Determinants of Health   Financial Resource Strain: Not on file  Food Insecurity: Not on file  Transportation Needs: Not on file  Physical Activity: Not on file  Stress: Not on file  Social Connections: Not on file  Intimate Partner Violence: Not on file     Family History  Problem Relation Age of Onset   Heart attack Father    Colon polyps Father    Colon polyps Brother    Colon cancer Neg Hx    Stomach cancer Neg Hx    Pancreatic cancer Neg Hx    Esophageal cancer Neg Hx    Liver disease Neg Hx      ROS:  Please see the history of present illness.     All other systems reviewed and negative.   Please see the history of present illness. (+) LE Edema (+) Dizziness (+)  All other systems are reviewed and negative.     Physical Exam:  Blood pressure 106/64, pulse (!) 51, height 6' (1.829 m), weight 193 lb (87.5 kg), SpO2 95 %. General: Well developed, well nourished male in no acute distress. Head: Normocephalic, atraumatic, sclera non-icteric, no xanthomas, nares are without discharge. EENT: normal  Lymph Nodes:  none Neck: Negative for carotid bruits. JVD 8 cm  back:without scoliosis kyphosis Lungs: Clear bilaterally to auscultation without wheezes, rales,  or rhonchi. Breathing is unlabored. Heart: RRR with S1 S2. No  murmur . No rubs, or gallops appreciated. Abdomen: Soft, non-tender, non-distended with normoactive bowel sounds.  No hepatomegaly. No rebound/guarding. No obvious abdominal masses. Msk:  Strength and tone appear normal for age. Extremities: No clubbing or cyanosis. 2+ edema.  Distal pedal pulses are 2+ and equal bilaterally. Skin: Warm and Dry Neuro: Alert and oriented X 3. CN III-XII intact Grossly normal sensory and motor function . Psych:  Responds to questions appropriately with a normal affect.        01/03/2021 EKG: Sinus with P synchronous pacing at 51 Interval 16/18/51 QRS upright lead I and negative lead V1.     Assessment and Plan:  Nonischemic cardiomyopathy   Coronary artery disease status post DES stenting  ICD- CRT Medtronic --Elevated LV threshold     Congestive heart failure-chronic-systolic class IIb   Left bundle branch block   Mitral regurgitation  His LV lead currently does not work in any vector.  He has lost resynchronization which has been associated with an improvement in his EF from 20--60%.  Moreover, there is an acute accumulation of fluid a suggested by OptiVol concurrent with loss of LV lead function.  We will resume his furosemide at 40 mg daily x5 days and every other day.  He will be continued on his other heart failure medications including bisoprolol 5 mg daily, Entresto 97/103 spironolactone 25 mg daily.  Will obtain a chest x-ray to see if the lead is totally dislodged.  There is a significant amount of overhead activity prior to this occurring but still is unusual at 3 years.  That lead impedance is normal suggesting is not a lead fracture.  Following the chest x-ray will review with Drs. Taylor/Lambert to see what strategies need to be available to replace the lead.   I,Tinashe Williams,acting as a Education administrator for Virl Axe, MD.,have documented all relevant documentation on the  behalf of Virl Axe, MD,as directed by  Virl Axe, MD while in the presence of Virl Axe, MD.   I, Virl Axe, MD, have reviewed all documentation for this visit. The documentation on 01/03/21 for the exam, diagnosis, procedures, and orders are all accurate and complete.

## 2021-01-08 ENCOUNTER — Telehealth: Payer: Self-pay

## 2021-01-08 LAB — CUP PACEART INCLINIC DEVICE CHECK
Battery Remaining Longevity: 15 mo
Battery Voltage: 2.91 V
Brady Statistic AP VP Percent: 7.4 %
Brady Statistic AP VS Percent: 0.22 %
Brady Statistic AS VP Percent: 92.29 %
Brady Statistic AS VS Percent: 0.08 %
Brady Statistic RA Percent Paced: 7.61 %
Brady Statistic RV Percent Paced: 96.9 %
Date Time Interrogation Session: 20221229163500
HighPow Impedance: 61 Ohm
Implantable Lead Implant Date: 20190311
Implantable Lead Implant Date: 20190311
Implantable Lead Implant Date: 20190311
Implantable Lead Location: 753858
Implantable Lead Location: 753859
Implantable Lead Location: 753860
Implantable Lead Model: 5076
Implantable Pulse Generator Implant Date: 20190311
Lead Channel Impedance Value: 133 Ohm
Lead Channel Impedance Value: 156.471
Lead Channel Impedance Value: 156.471
Lead Channel Impedance Value: 156.471
Lead Channel Impedance Value: 190 Ohm
Lead Channel Impedance Value: 266 Ohm
Lead Channel Impedance Value: 266 Ohm
Lead Channel Impedance Value: 380 Ohm
Lead Channel Impedance Value: 380 Ohm
Lead Channel Impedance Value: 380 Ohm
Lead Channel Impedance Value: 399 Ohm
Lead Channel Impedance Value: 437 Ohm
Lead Channel Impedance Value: 456 Ohm
Lead Channel Impedance Value: 494 Ohm
Lead Channel Impedance Value: 513 Ohm
Lead Channel Impedance Value: 551 Ohm
Lead Channel Impedance Value: 551 Ohm
Lead Channel Impedance Value: 608 Ohm
Lead Channel Pacing Threshold Amplitude: 0.375 V
Lead Channel Pacing Threshold Amplitude: 0.875 V
Lead Channel Pacing Threshold Amplitude: 1.25 V
Lead Channel Pacing Threshold Pulse Width: 0.4 ms
Lead Channel Pacing Threshold Pulse Width: 0.4 ms
Lead Channel Pacing Threshold Pulse Width: 0.4 ms
Lead Channel Sensing Intrinsic Amplitude: 1.125 mV
Lead Channel Sensing Intrinsic Amplitude: 1.625 mV
Lead Channel Sensing Intrinsic Amplitude: 10.25 mV
Lead Channel Sensing Intrinsic Amplitude: 13.375 mV
Lead Channel Setting Pacing Amplitude: 0.5 V
Lead Channel Setting Pacing Amplitude: 2 V
Lead Channel Setting Pacing Amplitude: 2.5 V
Lead Channel Setting Pacing Pulse Width: 0.4 ms
Lead Channel Setting Pacing Pulse Width: 0.4 ms
Lead Channel Setting Sensing Sensitivity: 0.3 mV

## 2021-01-08 NOTE — Telephone Encounter (Signed)
-----   Message from Deboraha Sprang, MD sent at 01/05/2021  4:36 PM EST ----- Please Inform Patient that -CXR confirms macro lead dislodgment -- it is now back about 6-8 inches and will need to reposition and will discuss with colleagues   Thanks

## 2021-01-08 NOTE — Telephone Encounter (Signed)
Spoke with pt and advised of Dr Olin Pia comments as below.  Pt verbalizes understanding and will await additional recommendations from Dr [name].

## 2021-02-04 ENCOUNTER — Ambulatory Visit (INDEPENDENT_AMBULATORY_CARE_PROVIDER_SITE_OTHER): Payer: 59 | Admitting: Internal Medicine

## 2021-02-04 ENCOUNTER — Encounter: Payer: Self-pay | Admitting: Internal Medicine

## 2021-02-04 ENCOUNTER — Encounter: Payer: Self-pay | Admitting: Gastroenterology

## 2021-02-04 ENCOUNTER — Other Ambulatory Visit (INDEPENDENT_AMBULATORY_CARE_PROVIDER_SITE_OTHER): Payer: Self-pay

## 2021-02-04 ENCOUNTER — Ambulatory Visit (INDEPENDENT_AMBULATORY_CARE_PROVIDER_SITE_OTHER): Payer: 59 | Admitting: Gastroenterology

## 2021-02-04 ENCOUNTER — Other Ambulatory Visit: Payer: Self-pay

## 2021-02-04 ENCOUNTER — Encounter: Payer: Self-pay | Admitting: *Deleted

## 2021-02-04 VITALS — BP 92/60 | HR 54 | Ht 70.0 in | Wt 190.0 lb

## 2021-02-04 VITALS — BP 108/56 | HR 64 | Ht 70.0 in | Wt 198.0 lb

## 2021-02-04 DIAGNOSIS — I5020 Unspecified systolic (congestive) heart failure: Secondary | ICD-10-CM | POA: Diagnosis not present

## 2021-02-04 DIAGNOSIS — K219 Gastro-esophageal reflux disease without esophagitis: Secondary | ICD-10-CM

## 2021-02-04 DIAGNOSIS — K5000 Crohn's disease of small intestine without complications: Secondary | ICD-10-CM

## 2021-02-04 LAB — HEPATIC FUNCTION PANEL
ALT: 11 U/L (ref 0–53)
AST: 14 U/L (ref 0–37)
Albumin: 3.9 g/dL (ref 3.5–5.2)
Alkaline Phosphatase: 61 U/L (ref 39–117)
Bilirubin, Direct: 0.2 mg/dL (ref 0.0–0.3)
Total Bilirubin: 0.9 mg/dL (ref 0.2–1.2)
Total Protein: 6.6 g/dL (ref 6.0–8.3)

## 2021-02-04 LAB — BASIC METABOLIC PANEL
BUN/Creatinine Ratio: 11 (ref 9–20)
BUN: 10 mg/dL (ref 6–24)
CO2: 24 mmol/L (ref 20–29)
Calcium: 9.5 mg/dL (ref 8.7–10.2)
Chloride: 106 mmol/L (ref 96–106)
Creatinine, Ser: 0.89 mg/dL (ref 0.76–1.27)
Glucose: 106 mg/dL — ABNORMAL HIGH (ref 70–99)
Potassium: 4.6 mmol/L (ref 3.5–5.2)
Sodium: 140 mmol/L (ref 134–144)
eGFR: 101 mL/min/{1.73_m2} (ref >59)

## 2021-02-04 LAB — CBC WITH DIFFERENTIAL/PLATELET
Basophils Absolute: 0 10*3/uL (ref 0.0–0.2)
Basos: 1 %
EOS (ABSOLUTE): 0.1 10*3/uL (ref 0.0–0.4)
Eos: 2 %
Hematocrit: 31.5 % — ABNORMAL LOW (ref 37.5–51.0)
Hemoglobin: 10.6 g/dL — ABNORMAL LOW (ref 13.0–17.7)
Immature Grans (Abs): 0 10*3/uL (ref 0.0–0.1)
Immature Granulocytes: 0 %
Lymphocytes Absolute: 1.3 10*3/uL (ref 0.7–3.1)
Lymphs: 24 %
MCH: 29.4 pg (ref 26.6–33.0)
MCHC: 33.7 g/dL (ref 31.5–35.7)
MCV: 88 fL (ref 79–97)
Monocytes Absolute: 0.5 10*3/uL (ref 0.1–0.9)
Monocytes: 9 %
Neutrophils Absolute: 3.5 10*3/uL (ref 1.4–7.0)
Neutrophils: 64 %
Platelets: 179 10*3/uL (ref 150–450)
RBC: 3.6 x10E6/uL — ABNORMAL LOW (ref 4.14–5.80)
RDW: 13.2 % (ref 11.6–15.4)
WBC: 5.5 10*3/uL (ref 3.4–10.8)

## 2021-02-04 LAB — SEDIMENTATION RATE: Sed Rate: 16 mm/hr (ref 0–20)

## 2021-02-04 NOTE — Progress Notes (Signed)
Miguel Pena    [id number]    September 07, 1965  Primary Care Physician:Elkins, Curt Jews, MD  Referring Physician: [name], MD [address] [place],  [place] [address]   Chief complaint:  Crohn's disease  HPI:  56 year old very pleasant gentleman with history of CHF, CAD on Plavix, follow-up visit for Crohn's disease.   Denies any diarrhea, constipation, melena or blood in stool  Overall he feels much better on maintenance dose of Humira, he completed the induction dose in September 2022   He is s/p CRT-D March 2019.     Relevant GI history: EGD December 06, 2018: LA grade C esophagitis and gastritis.  H. pylori negative   Colonoscopy December 06, 2018: Terminal ileum ulceration with ileitis, biopsies consistent with IBD.  Diverticulosis and internal hemorrhoids otherwise normal exam.   Capsule endoscopy October 22, 2018: Multiple aphthous ulcers and inflammation throughout small bowel, predominantly in the distal small bowel consistent with Crohn's ileitis     03/10/2017 EGD : LA grade C reflux and candidiasis esophagitis, benign-appearing esophageal stenosis, gastritis with hemorrhage, duodenal erosions without bleeding otherwise normal.  Started on Nexium 40 twice daily.      03/10/2017  Colonoscopy : moderate diverticulosis in the sigmoid and descending colon, narrowing of the colon in association with diverticular opening, peridiverticular erythema, 1 5 mm polyp in the sigmoid colon and nonbleeding internal hemorrhoids.   Pathology showed chronic gastritis and reflux esophagitis negative for H. pylori or dysplasia.  Polyp removed was hyperplastic.  Recall colonoscopy recommended in 10 years.      CT abdomen pelvis with contrast 09/29/2018 with inflammatory changes and thickening of the distal and terminal ileum which may represent infectious enteritis or an inflammatory bowel disease.  No bowel obstruction, normal appendix.  Sigmoid diverticulosis  and cholelithiasis.       Outpatient Encounter Medications as of 02/04/2021  Medication Sig   aspirin EC 81 MG tablet Take 1 tablet (81 mg total) by mouth daily. Swallow whole.   bisoprolol (ZEBETA) 5 MG tablet TAKE 1 TABLET BY MOUTH EVERY DAY   dicyclomine (BENTYL) 10 MG capsule TAKE ONE CAPSULE BY MOUTH THREE TIMES DAILY(NOON, 4:00 PM AND EVENING)   diphenhydrAMINE (BENADRYL) 25 MG tablet Take 25 mg by mouth every 6 (six) hours as needed.   ENTRESTO 97-103 MG TAKE 1 TABLET BY MOUTH TWICE DAILY   furosemide (LASIX) 40 MG tablet Take 1 tablet (40 mg total) by mouth every other day.   Homeopathic Products (ZICAM ALLERGY RELIEF NA) Place 1 spray into the nose daily as needed (allergies).   HUMIRA PEN 40 MG/0.4ML PNKT Inject 40 mg into the skin every 14 (fourteen) days. Begin on day 29 then every 14 days maintenance dose Dx K50.919   HYDROcodone-acetaminophen (NORCO) 10-325 MG tablet Take 1 tablet by mouth every 4 (four) hours as needed for moderate pain.    Multiple Vitamins-Minerals (HAIR/SKIN/NAILS) CAPS Take 2 capsules by mouth daily.   nicotine polacrilex (NICORETTE) 4 MG gum Take 4 mg by mouth as needed for smoking cessation.   omega-3 acid ethyl esters (LOVAZA) 1 g capsule Take 2 g by mouth 2 (two) times daily.   pantoprazole (PROTONIX) 40 MG tablet Take 1 tablet (40 mg total) by mouth daily. Before breakfast   potassium chloride SA (KLOR-CON) 20 MEQ tablet TAKE 1 TABLET BY MOUTH DAILY   rosuvastatin (CRESTOR) 40 MG tablet TAKE 1 TABLET(40 MG) BY MOUTH DAILY  spironolactone (ALDACTONE) 25 MG tablet TAKE 1 TABLET BY MOUTH EVERY DAY   No facility-administered encounter medications on file as of 02/04/2021.    Allergies as of 02/04/2021   (No Known Allergies)    Past Medical History:  Diagnosis Date   Bursitis of right shoulder    CHF (congestive heart failure) ([place])    [name] 10/24/2016   Chronic combined systolic and diastolic heart failure ([place]) 03/16/2017   Dilated  cardiomyopathy ([place])    [name] 10/24/2016   Dysrhythmia 1995   "VT" per patient- states had neg stress test and no problems since   Encounter for adjustment or management of automatic implantable cardioverter-defibrillator 06/17/2018   Encounter for assessment of automatic implantable cardioverter-defibrillator (AICD) 06/17/2018   Heart murmur    "when I was young"   History of kidney stones    Hypercholesterolemia    ICD: Medtronic MRI Quad CRTD (Bi-V ICD) implantation 03/16/2017 [name] 03/16/2017   Scheduled Remote ICD check   8.7.20: No VHR episodes. No mode switches. Normal health trends. Trans-thoracic impedance trends and the OptiVol Fluid Index do no present significant abnormalities. Battery longevity is 6.5-8.3 years. RA pacing is 8.3 %, RV pacing is 99.9 %, and LV pacing is 99.9 %.  Clinic: 05/18/17   NICM (nonischemic cardiomyopathy) ([place]) 03/16/2017    Past Surgical History:  Procedure Laterality Date   BIOPSY  12/06/2018   Procedure: BIOPSY;  Surgeon: Mauri Pole, MD;  [facility];  Service: Endoscopy;;  Egd and Colon   BIV ICD INSERTION CRT-D N/A 03/16/2017   Procedure: BIV ICD INSERTION CRT-D;  Surgeon: Deboraha Sprang, MD;  Location: [facility];  Service: Cardiovascular;  Laterality: N/A;   BUBBLE STUDY  11/30/2019   Procedure: BUBBLE STUDY;  Surgeon: Rex Kras, DO;  Location: [facility];  Service: Cardiovascular;;   COLONOSCOPY WITH PROPOFOL N/A 03/10/2017   Procedure: COLONOSCOPY WITH PROPOFOL;  Surgeon: Mauri Pole, MD;  [facility];  Service: Endoscopy;  Laterality: N/A;   COLONOSCOPY WITH PROPOFOL N/A 12/06/2018   Procedure: COLONOSCOPY WITH PROPOFOL;  Surgeon: Mauri Pole, MD;  [facility];  Service: Endoscopy;  Laterality: N/A;   CORONARY ANGIOPLASTY WITH STENT PLACEMENT  10/28/2016   CORONARY STENT INTERVENTION N/A 10/28/2016   Procedure: CORONARY STENT INTERVENTION;  Surgeon: [name], MD;   Location: [facility];  Service: Cardiovascular;  Laterality: N/A;   ESOPHAGOGASTRODUODENOSCOPY (EGD) WITH PROPOFOL N/A 03/10/2017   Procedure: ESOPHAGOGASTRODUODENOSCOPY (EGD) WITH PROPOFOL;  Surgeon: Mauri Pole, MD;  [facility];  Service: Endoscopy;  Laterality: N/A;   ESOPHAGOGASTRODUODENOSCOPY (EGD) WITH PROPOFOL N/A 12/06/2018   Procedure: ESOPHAGOGASTRODUODENOSCOPY (EGD) WITH PROPOFOL;  Surgeon: Mauri Pole, MD;  [facility];  Service: Endoscopy;  Laterality: N/A;   INGUINAL HERNIA REPAIR Right 02/16/2013   Procedure: RIGHT INGUINAL HERNIA REPAIR ;  Surgeon: [name], MD;  [facility];  Service: General;  Laterality: Right;  With MESH   INGUINAL HERNIA REPAIR Right 02/16/2013   INTRAVASCULAR PRESSURE WIRE/FFR STUDY N/A 10/28/2016   Procedure: INTRAVASCULAR PRESSURE WIRE/FFR STUDY;  Surgeon: [name], MD;  Location: [facility];  Service: Cardiovascular;  Laterality: N/A;   RIGHT/LEFT HEART CATH AND CORONARY ANGIOGRAPHY N/A 10/28/2016   Procedure: RIGHT/LEFT HEART CATH AND CORONARY ANGIOGRAPHY;  Surgeon: [name], MD;  Location: [facility];  Service: Cardiovascular;  Laterality: N/A;   TEE WITHOUT CARDIOVERSION N/A 11/30/2019   Procedure: TRANSESOPHAGEAL ECHOCARDIOGRAM (  TEE);  Surgeon: Rex Kras, DO;  Location: [facility];  Service: Cardiovascular;  Laterality: N/A;   ULTRASOUND GUIDANCE FOR VASCULAR ACCESS  10/28/2016   Procedure: Ultrasound Guidance For Vascular Access;  Surgeon: [name], MD;  Location: [facility];  Service: Cardiovascular;;    Family History  Problem Relation Age of Onset   Heart attack Father    Colon polyps Father    Colon polyps Brother    Colon cancer Neg Hx    Stomach cancer Neg Hx    Pancreatic cancer Neg Hx    Esophageal cancer Neg Hx    Liver disease Neg Hx     Social History   Socioeconomic History   Marital status: Married     Spouse name: Not on file   Number of children: 2   Years of education: Not on file   Highest education level: Not on file  Occupational History   Occupation: Emergency planning/management officer    Comment: [employer]  Tobacco Use   Smoking status: Former    Packs/day: 1.00    Years: 12.00    Pack years: 12.00    Types: Cigarettes, Cigars    Quit date: 02/16/2011    Years since quitting: 9.9   Smokeless tobacco: Former    Types: Snuff   Tobacco comments:    10/28/2016 'chewed for a couple years after I quit smoking"  Vaping Use   Vaping Use: Never used  Substance and Sexual Activity   Alcohol use: No   Drug use: No   Sexual activity: Not Currently  Other Topics Concern   Not on file  Social History Narrative   Not on file   Social Determinants of Health   Financial Resource Strain: Not on file  Food Insecurity: Not on file  Transportation Needs: Not on file  Physical Activity: Not on file  Stress: Not on file  Social Connections: Not on file  Intimate Partner Violence: Not on file      Review of systems: All other review of systems negative except as mentioned in the HPI.   Physical Exam: Vitals:   02/04/21 1354  BP: (!) 108/56  Pulse: 64   Body mass index is 28.41 kg/m. Gen:      No acute distress HEENT:  sclera anicteric Abd:      soft, non-tender; no palpable masses, no distension Ext:    No edema Neuro: alert and oriented x 3 Psych: normal mood and affect  Data Reviewed:  Reviewed labs, radiology imaging, old records and pertinent past GI work up   Assessment and Plan/Recommendations:  56 year old very pleasant gentleman with history of CAD, CHF s/p CRT-D, GERD and Crohn's disease   Crohn's disease: Overall symptoms have improved, in clinical remission Continue Humira maintenance dose every 2 weeks Follow-up LFT and CRP   Abdominal discomfort: Use dicyclomine 10 mg every 8 hours as needed   GERD: Use pantoprazole 40 mg daily Continue antireflux  measures   Return in 3 months  This visit required 30 minutes of patient care (this includes precharting, chart review, review of results, face-to-face time used for counseling as well as treatment plan and follow-up. The patient was provided an opportunity to ask questions and all were answered. The patient agreed with the plan and demonstrated an understanding of the instructions.  Damaris Hippo , MD    CC: [name], *

## 2021-02-04 NOTE — Progress Notes (Signed)
HPI Mr. Fricke is referred by Dr. Curt Bears for evaluation of late LV lead dislodgement. He is a pleasant middle aged man with a non-ischemic CM, s/p Biv ICD insertion who developed an elevated pacing threshold and a CXR demonstrates dislodgement of his LV lead into the RA. He feels weaker. He has a h/o normalization of his LV function after biv ICD insertion.  No Known Allergies   Current Outpatient Medications  Medication Sig Dispense Refill   aspirin EC 81 MG tablet Take 1 tablet (81 mg total) by mouth daily. Swallow whole. 90 tablet 3   bisoprolol (ZEBETA) 5 MG tablet TAKE 1 TABLET BY MOUTH EVERY DAY 90 tablet 3   dicyclomine (BENTYL) 10 MG capsule TAKE ONE CAPSULE BY MOUTH THREE TIMES DAILY(NOON, 4:00 PM AND EVENING) 90 capsule 3   diphenhydrAMINE (BENADRYL) 25 MG tablet Take 25 mg by mouth every 6 (six) hours as needed.     ENTRESTO 97-103 MG TAKE 1 TABLET BY MOUTH TWICE DAILY 180 tablet 3   furosemide (LASIX) 40 MG tablet Take 1 tablet (40 mg total) by mouth every other day. 45 tablet 3   Homeopathic Products (ZICAM ALLERGY RELIEF NA) Place 1 spray into the nose daily as needed (allergies).     HUMIRA PEN 40 MG/0.4ML PNKT Inject 40 mg into the skin every 14 (fourteen) days. Begin on day 29 then every 14 days maintenance dose Dx K50.919 2 each 11   HYDROcodone-acetaminophen (NORCO) 10-325 MG tablet Take 1 tablet by mouth every 4 (four) hours as needed for moderate pain.      Multiple Vitamins-Minerals (HAIR/SKIN/NAILS) CAPS Take 2 capsules by mouth daily.     nicotine polacrilex (NICORETTE) 4 MG gum Take 4 mg by mouth as needed for smoking cessation.     omega-3 acid ethyl esters (LOVAZA) 1 g capsule Take 2 g by mouth 2 (two) times daily.     pantoprazole (PROTONIX) 40 MG tablet Take 1 tablet (40 mg total) by mouth daily. Before breakfast 90 tablet 3   potassium chloride SA (KLOR-CON) 20 MEQ tablet TAKE 1 TABLET BY MOUTH DAILY 90 tablet 3   rosuvastatin (CRESTOR) 40 MG tablet TAKE  1 TABLET(40 MG) BY MOUTH DAILY 90 tablet 3   spironolactone (ALDACTONE) 25 MG tablet TAKE 1 TABLET BY MOUTH EVERY DAY 90 tablet 1   No current facility-administered medications for this visit.     Past Medical History:  Diagnosis Date   Bursitis of right shoulder    CHF (congestive heart failure) ([place])    [name] 10/24/2016   Chronic combined systolic and diastolic heart failure ([place]) 03/16/2017   Dilated cardiomyopathy ([place])    [name] 10/24/2016   Dysrhythmia 1995   "VT" per patient- states had neg stress test and no problems since   Encounter for adjustment or management of automatic implantable cardioverter-defibrillator 06/17/2018   Encounter for assessment of automatic implantable cardioverter-defibrillator (AICD) 06/17/2018   Heart murmur    "when I was young"   History of kidney stones    Hypercholesterolemia    ICD: Medtronic MRI Quad CRTD (Bi-V ICD) implantation 03/16/2017 [name] 03/16/2017   Scheduled Remote ICD check   8.7.20: No VHR episodes. No mode switches. Normal health trends. Trans-thoracic impedance trends and the OptiVol Fluid Index do no present significant abnormalities. Battery longevity is 6.5-8.3 years. RA pacing is 8.3 %, RV pacing is 99.9 %, and LV pacing is 99.9 %.  Clinic: 05/18/17   NICM (nonischemic cardiomyopathy) ([place])  03/16/2017    ROS:   All systems reviewed and negative except as noted in the HPI.   Past Surgical History:  Procedure Laterality Date   BIOPSY  12/06/2018   Procedure: BIOPSY;  Surgeon: Mauri Pole, MD;  Location: [facility];  Service: Endoscopy;;  Egd and Colon   BIV ICD INSERTION CRT-D N/A 03/16/2017   Procedure: BIV ICD INSERTION CRT-D;  Surgeon: Deboraha Sprang, MD;  Location: [facility];  Service: Cardiovascular;  Laterality: N/A;   BUBBLE STUDY  11/30/2019   Procedure: BUBBLE STUDY;  Surgeon: Rex Kras, DO;  Location: [facility];  Service: Cardiovascular;;   COLONOSCOPY WITH PROPOFOL N/A 03/10/2017    Procedure: COLONOSCOPY WITH PROPOFOL;  Surgeon: Mauri Pole, MD;  [facility]: [facility];  Service: Endoscopy;  Laterality: N/A;   COLONOSCOPY WITH PROPOFOL N/A 12/06/2018   Procedure: COLONOSCOPY WITH PROPOFOL;  Surgeon: Mauri Pole, MD;  [facility];  Service: Endoscopy;  Laterality: N/A;   CORONARY ANGIOPLASTY WITH STENT PLACEMENT  10/28/2016   CORONARY STENT INTERVENTION N/A 10/28/2016   Procedure: CORONARY STENT INTERVENTION;  Surgeon: [name], MD;  Location: [facility];  Service: Cardiovascular;  Laterality: N/A;   ESOPHAGOGASTRODUODENOSCOPY (EGD) WITH PROPOFOL N/A 03/10/2017   Procedure: ESOPHAGOGASTRODUODENOSCOPY (EGD) WITH PROPOFOL;  Surgeon: Mauri Pole, MD;  [facility];  Service: Endoscopy;  Laterality: N/A;   ESOPHAGOGASTRODUODENOSCOPY (EGD) WITH PROPOFOL N/A 12/06/2018   Procedure: ESOPHAGOGASTRODUODENOSCOPY (EGD) WITH PROPOFOL;  Surgeon: Mauri Pole, MD;  [facility];  Service: Endoscopy;  Laterality: N/A;   INGUINAL HERNIA REPAIR Right 02/16/2013   Procedure: RIGHT INGUINAL HERNIA REPAIR ;  Surgeon: [name], MD;  [facility]: [facility];  Service: General;  Laterality: Right;  With MESH   INGUINAL HERNIA REPAIR Right 02/16/2013   INTRAVASCULAR PRESSURE WIRE/FFR STUDY N/A 10/28/2016   Procedure: INTRAVASCULAR PRESSURE WIRE/FFR STUDY;  Surgeon: [name], MD;  Location: [facility];  Service: Cardiovascular;  Laterality: N/A;   RIGHT/LEFT HEART CATH AND CORONARY ANGIOGRAPHY N/A 10/28/2016   Procedure: RIGHT/LEFT HEART CATH AND CORONARY ANGIOGRAPHY;  Surgeon: [name], MD;  Location: [facility];  Service: Cardiovascular;  Laterality: N/A;   TEE WITHOUT CARDIOVERSION N/A 11/30/2019   Procedure: TRANSESOPHAGEAL ECHOCARDIOGRAM (TEE);  Surgeon: Rex Kras, DO;  Location: [facility];  Service: Cardiovascular;  Laterality: N/A;   ULTRASOUND GUIDANCE FOR VASCULAR  ACCESS  10/28/2016   Procedure: Ultrasound Guidance For Vascular Access;  Surgeon: [name], MD;  Location: [facility];  Service: Cardiovascular;;     Family History  Problem Relation Age of Onset   Heart attack Father    Colon polyps Father    Colon polyps Brother    Colon cancer Neg Hx    Stomach cancer Neg Hx    Pancreatic cancer Neg Hx    Esophageal cancer Neg Hx    Liver disease Neg Hx      Social History   Socioeconomic History   Marital status: Married    Spouse name: Not on file   Number of children: 2   Years of education: Not on file   Highest education level: Not on file  Occupational History   Occupation: Emergency planning/management officer    Comment: [employer]  Tobacco Use   Smoking status: Former    Packs/day: 1.00    Years: 12.00    Pack years: 12.00    Types: Cigarettes, Cigars    Quit  date: 02/16/2011    Years since quitting: 9.9   Smokeless tobacco: Former    Types: Snuff   Tobacco comments:    10/28/2016 'chewed for a couple years after I quit smoking"  Vaping Use   Vaping Use: Never used  Substance and Sexual Activity   Alcohol use: No   Drug use: No   Sexual activity: Not Currently  Other Topics Concern   Not on file  Social History Narrative   Not on file   Social Determinants of Health   Financial Resource Strain: Not on file  Food Insecurity: Not on file  Transportation Needs: Not on file  Physical Activity: Not on file  Stress: Not on file  Social Connections: Not on file  Intimate Partner Violence: Not on file     BP 92/60    Pulse (!) 54    Ht 5' 10"  (1.778 m)    Wt 190 lb (86.2 kg)    SpO2 97%    BMI 27.26 kg/m   Physical Exam:  Well appearing middle aged man, NAD HEENT: Unremarkable Neck:  No JVD, no thyromegally Lymphatics:  No adenopathy Back:  No CVA tenderness Lungs:  Clear with no wheezes HEART:  Regular rate rhythm, no murmurs, no rubs, no clicks Abd:  soft, positive bowel sounds, no organomegally, no  rebound, no guarding Ext:  2 plus pulses, no edema, no cyanosis, no clubbing Skin:  No rashes no nodules Neuro:  CN II through XII intact, motor grossly intact  ECG - NSR with LBBB  DEVICE  Normal device function.  See PaceArt for details. LV lead is turned off  Assess/Plan:  LV lead dislodgement -I have discussed the treatment options with the patient and recommended he undergo extraction and insertion of a new LV lead, either by way of the CS or left bundle area. I have reviewed the indications/risks/benefits/goals/expectations and he wishes to proceed. Chronic systolic heart failure - his symptoms have worsened since his dislogement. I have asked him to continue GDMT.  Carleene Overlie Ruble Pumphrey,MD

## 2021-02-04 NOTE — Patient Instructions (Addendum)
Medication Instructions:  Your physician recommends that you continue on your current medications as directed. Please refer to the Current Medication list given to you today. *If you need a refill on your cardiac medications before your next appointment, please call your pharmacy*  Lab Work: CBC, BMP If you have labs (blood work) drawn today and your tests are completely normal, you will receive your results only by: [place] (if you have MyChart) OR A paper copy in the mail If you have any lab test that is abnormal or we need to change your treatment, we will call you to review the results.  Testing/Procedures: None.  Follow-Up: At [facility], you and your health needs are our priority.  As part of our continuing mission to provide you with exceptional heart care, we have created designated Provider Care Teams.  These Care Teams include your primary Cardiologist (physician) and Advanced Practice Providers (APPs -  Physician Assistants and Nurse Practitioners) who all work together to provide you with the care you need, when you need it.  Your physician wants you to follow-up in: Sonia Baller will contact you with a procedure date once this is worked out with the [place] and [place] researcher.   We recommend signing up for the patient portal called "MyChart".  Sign up information is provided on this After Visit Summary.  MyChart is used to connect with patients for Virtual Visits (Telemedicine).  Patients are able to view lab/test results, encounter notes, upcoming appointments, etc.  Non-urgent messages can be sent to your provider as well.   To learn more about what you can do with MyChart, go to [email or web address].    Any Other Special Instructions Will Be Listed Below (If Applicable).  Cardioverter Defibrillator Implantation An implantable cardioverter defibrillator (ICD) is a device that identifies and corrects abnormal heart rhythms. Cardioverter defibrillator implantation is a  surgery to place an ICD under the skin in the chest or abdomen. An ICD has a battery, a small computer (pulse generator), and wires (leads) that go into the heart. The ICD detects and corrects two types of dangerous irregular heart rhythms (arrhythmias): A rapid heart rhythm in the lower chambers of the heart (ventricles). This is called ventricular tachycardia. The ventricles contracting in an uncoordinated way. This is called ventricular fibrillation. There are different types of ICDs, and the electrical signals from the ICD can be programmed differently based on the condition being treated. The electrical signals from the ICD can be low-energy pulses, high-energy shocks, or a combination of the two. The low-energy pulses are generally used to restore the heartbeat to normal when it is either too slow (bradycardia) or too fast. These pulses are painless. The high-energy shocks are used to treat abnormal rhythms such as ventricular tachycardia or ventricular fibrillation. This shock may feel like a strong jolt in the chest. Your health care provider may recommend an ICD if you have: Had a ventricular arrhythmia in the past. A damaged heart because of a disease or heart condition. A weakened heart muscle from a heart attack or cardiac arrest. A congenital heart defect. Long QT syndrome, which is a disorder of the heart's electrical system. Brugada syndrome, which is a condition that causes a disruption of the heart's normal rhythm. Tell a health care provider about: Any allergies you have. All medicines you are taking, including vitamins, herbs, eye drops, creams, and over-the-counter medicines. Any problems you or family members have had with anesthetic medicines. Any blood disorders you have. Any surgeries you have  had. Any medical conditions you have. Whether you are pregnant or may be pregnant. What are the risks? Generally, this is a safe procedure. However, problems may occur,  including: Infection. Bleeding. Allergic reactions to medicines used during the procedure. Blood clots. Swelling or bruising. Damage to nearby structures or organs, such as nerves, lungs, blood vessels, or the heart where the ICD leads or pulse generator is implanted. What happens before the procedure? Staying hydrated Follow instructions from your health care provider about hydration, which may include: Up to 2 hours before the procedure - you may continue to drink clear liquids, such as water, clear fruit juice, black coffee, and plain tea.  Eating and drinking restrictions Follow instructions from your health care provider about eating and drinking, which may include: 8 hours before the procedure - stop eating heavy meals or foods, such as meat, fried foods, or fatty foods. 6 hours before the procedure - stop eating light meals or foods, such as toast or cereal. 6 hours before the procedure - stop drinking milk or drinks that contain milk. 2 hours before the procedure - stop drinking clear liquids. Medicines Ask your health care provider about: Changing or stopping your regular medicines. This is especially important if you are taking diabetes medicines or blood thinners. Taking medicines such as aspirin and ibuprofen. These medicines can thin your blood. Do not take these medicines unless your health care provider tells you to take them. Taking over-the-counter medicines, vitamins, herbs, and supplements. Tests You may have an exam or testing. These may include: Blood tests. A test to check the electrical signals in your heart (electrocardiogram, ECG). Imaging tests, such as a chest X-ray. Echocardiogram. This is an ultrasound of your heart to evaluate your heart structures and function. An event monitor or Holter monitor to wear at home. General instructions Do not use any products that contain nicotine or tobacco for at least 4 weeks before the procedure. These products include  cigarettes, chewing tobacco, and vaping devices, such as e-cigarettes. If you need help quitting, ask your health care provider. Ask your health care provider: How your procedure site will be marked. What steps will be taken to help prevent infection. These may include: Removing hair at the surgery site. Washing skin with a germ-killing soap. Taking antibiotic medicine. You may be asked to shower with a germ-killing soap. Plan to have a responsible adult take you home from the hospital or clinic. What happens during the procedure?  Small monitors will be put on your body. They will be used to check your heart rate, blood pressure, and oxygen level. A pair of sticky pads (defibrillator pads) may be placed on your back and chest. These pads are able to pace your heart as needed during the procedure. An IV will be inserted into one of your veins. You will be given one or more of the following: A medicine to help you relax (sedative). A medicine to numb the area (local anesthetic). A medicine to make you fall asleep(general anesthetic). A small incision will be made to create a deep pocket under the skin of your chest or abdomen. Leads will be guided through a blood vessel into your heart and attached to your heart muscles. Depending on the ICD, the leads may go into one ventricle, or they may go into both ventricles and into an upper chamber of the heart. An X-ray machine (fluoroscope) will be used to help guide the leads. The other end of the leads will be attached  to the pulse generator. The pulse generator will be placed into the pocket under the skin. The ICD will be tested, and your health care provider will program the ICD for the condition being treated. The incision will be closed with stitches (sutures), skin glue, adhesive strips, or staples. A bandage (dressing) will be placed over the incision. The procedure may vary among health care providers and hospitals. What happens after the  procedure? Your blood pressure, heart rate, breathing rate, and blood oxygen level will be monitored until you leave the hospital or clinic. Your health care provider will also monitor your ICD to make sure it is working properly. A chest X-ray will be taken to check that the ICD is in the right place. Do not raise the arm on the side of your procedure higher than your shoulder for as long as told by your health care provider. This is usually at least 6 weeks. You may be given an identification card explaining that you have an ICD. You will be given a remote home monitoring device to use with your ICD to allow your device to communicate with your clinic. Summary An implantable cardioverter defibrillator (ICD) is a device that identifies and corrects abnormal heart rhythms. Cardioverter defibrillator implantation is a surgery to place an ICD under the skin in the chest or abdomen. An ICD consists of a battery, a small computer (pulse generator), and wires (leads) that go into the heart. During the procedure, the ICD will be tested, and your health care provider will program the ICD for the condition being treated. After the procedure, a chest X-ray will be taken to check that the ICD is in the right place. This information is not intended to replace advice given to you by your health care provider. Make sure you discuss any questions you have with your health care provider. Document Revised: 06/22/2019 Document Reviewed: 06/22/2019 Elsevier Patient Education  [place].

## 2021-02-04 NOTE — Patient Instructions (Addendum)
If you are age 57 or older, your body mass index should be between 23-30. Your Body mass index is 28.41 kg/m. If this is out of the aforementioned range listed, please consider follow up with your Primary Care Provider.  If you are age 37 or younger, your body mass index should be between 19-25. Your Body mass index is 28.41 kg/m. If this is out of the aformentioned range listed, please consider follow up with your Primary Care Provider.   ________________________________________________________  The [facility] providers would like to encourage you to use [facility] to communicate with providers for non-urgent requests or questions.  Due to long hold times on the telephone, sending your provider a message by [facility] may be a faster and more efficient way to get a response.  Please allow 48 business hours for a response.  Please remember that this is for non-urgent requests.  _______________________________________________________  Please go to the lab in the basement of our building to have lab work done as you leave today. Hit "B" for basement when you get on the elevator.  When the doors open the lab is on your left.  We will call you with the results. Thank you.  Please provide [place] a copy of your new insurance card as soon as possible so that we can work on the Prior Auth for your Humira.   We will see if we can get you a sample in the meantime.  Please follow up in 3 months.  I appreciate the opportunity to care for you. Thank you for choosing me and [facility],  Dr. Harl Bowie

## 2021-02-07 ENCOUNTER — Telehealth: Payer: Self-pay

## 2021-02-07 DIAGNOSIS — I428 Other cardiomyopathies: Secondary | ICD-10-CM

## 2021-02-07 DIAGNOSIS — T82110A Breakdown (mechanical) of cardiac electrode, initial encounter: Secondary | ICD-10-CM

## 2021-02-15 ENCOUNTER — Telehealth: Payer: Self-pay

## 2021-02-15 ENCOUNTER — Other Ambulatory Visit: Payer: Self-pay | Admitting: Cardiology

## 2021-02-15 ENCOUNTER — Encounter: Payer: Self-pay | Admitting: Gastroenterology

## 2021-02-15 DIAGNOSIS — E781 Pure hyperglyceridemia: Secondary | ICD-10-CM

## 2021-02-15 NOTE — Telephone Encounter (Signed)
Pa FOR HUMIRA 40 MG / 0.4 ML SENT THROUGH COVER MY MEDS.

## 2021-02-18 NOTE — Telephone Encounter (Signed)
The insurance denied the Humira. This is what happened last time as well. Do you want me to do the appeal?

## 2021-02-18 NOTE — Telephone Encounter (Signed)
Pt called stating he used to received Humira via mail but he stopped receiving it. Pt is not sure of why. I told him that it may be because it needs renewal of [place]. Could you pls call him to confirm if this is correct? Thank you.

## 2021-02-19 ENCOUNTER — Other Ambulatory Visit: Payer: Self-pay

## 2021-02-19 MED ORDER — HUMIRA (2 PEN) 40 MG/0.4ML ~~LOC~~ AJKT: 40.0000 mg | AUTO-INJECTOR | SUBCUTANEOUS | 6 refills | Status: DC

## 2021-02-19 NOTE — Telephone Encounter (Signed)
Yes please, it is unclear why its denied because he has clear indication and he has been receiving it for past 6 months until the recent insurance switch.  Thank you

## 2021-02-19 NOTE — Telephone Encounter (Signed)
Urgent appeal letter faxed to [facility] with requested lab values. Patient notified.

## 2021-02-20 ENCOUNTER — Telehealth: Payer: Self-pay

## 2021-02-20 NOTE — Telephone Encounter (Signed)
Called and spoke to patient. Let him know that the Humira sample we requested came today. He can pick up at this convenience. It will be on the 2nd floor. He expressed understanding.

## 2021-02-20 NOTE — Telephone Encounter (Signed)
Inbound call from patient following up on Humira. Please advise.

## 2021-02-21 DIAGNOSIS — K5000 Crohn's disease of small intestine without complications: Secondary | ICD-10-CM | POA: Insufficient documentation

## 2021-02-21 NOTE — Telephone Encounter (Signed)
Ok, thank you

## 2021-02-21 NOTE — Telephone Encounter (Signed)
Inbound call from patient would like a call back to his humira prescription

## 2021-02-21 NOTE — Telephone Encounter (Signed)
The Humira is approved. His out of pocket costs are over $2000.00 with the co-pay card. Miguel Pena is applying for patient assistance through [place] [employer]. I am helping him with that. He will pick up his sample and the application today.

## 2021-02-26 NOTE — Telephone Encounter (Signed)
Patient called requesting to speak with you.

## 2021-02-28 ENCOUNTER — Other Ambulatory Visit: Payer: Self-pay

## 2021-02-28 ENCOUNTER — Telehealth: Payer: Self-pay

## 2021-02-28 MED ORDER — PREDNISONE 10 MG PO TABS: ORAL_TABLET | ORAL | 0 refills | Status: DC

## 2021-02-28 NOTE — Telephone Encounter (Signed)
Patient has developed lower abdominal pain. He reports 2 soft formed bowel movements that did not alleviate the pain. Abdomen feels tender. He has taken Bentyl once this morning without relief. He has taken Gaviscon without relief. Denies nausea, fever or bloody stools. Denies tenesmus.  The application for Humira through [place] is in process as of today. Confirmation received. Patient is asking for prednisone.

## 2021-02-28 NOTE — Telephone Encounter (Signed)
Please send Rx for Prednisone 6m  daily and taper down by 571mevery week and stop. Unfortunately he doesn't do well off Humira, please request [employer] if they can expedite his application. Thank you

## 2021-02-28 NOTE — Telephone Encounter (Signed)
Patient called asking if he can get Prednisone medication because he has not received the Humira and he is in pain.

## 2021-02-28 NOTE — Telephone Encounter (Signed)
Patient advised. Process has been expedited and under review.  The patient reports he has pulled a lead from his heart and will have to have that placed again in the cath lab. He hopes it will be soon. Reports he feels weak and tired.

## 2021-02-28 NOTE — Telephone Encounter (Signed)
He should contact his cardiologist to let them know that he is experiencing change in symptoms and if he needs to do anything different in the interim.

## 2021-03-04 ENCOUNTER — Other Ambulatory Visit: Payer: Self-pay | Admitting: Cardiology

## 2021-03-04 NOTE — Telephone Encounter (Signed)
Outreach made to Pt.  Advised would try for March 13 or 20 for extraction.

## 2021-03-04 NOTE — Telephone Encounter (Signed)
Pt scheduled for BIV ICD gen change with LV lead extraction and replacement on March 28, 2021.  Will come in for lab work March 1 and go over instructions.  Work up complete.

## 2021-03-06 ENCOUNTER — Other Ambulatory Visit: Payer: Self-pay

## 2021-03-06 ENCOUNTER — Other Ambulatory Visit: Payer: 59 | Admitting: *Deleted

## 2021-03-06 DIAGNOSIS — I428 Other cardiomyopathies: Secondary | ICD-10-CM

## 2021-03-06 DIAGNOSIS — T82110A Breakdown (mechanical) of cardiac electrode, initial encounter: Secondary | ICD-10-CM

## 2021-03-06 LAB — BASIC METABOLIC PANEL
BUN/Creatinine Ratio: 13 (ref 9–20)
BUN: 13 mg/dL (ref 6–24)
CO2: 22 mmol/L (ref 20–29)
Calcium: 9.7 mg/dL (ref 8.7–10.2)
Chloride: 104 mmol/L (ref 96–106)
Creatinine, Ser: 0.97 mg/dL (ref 0.76–1.27)
Glucose: 96 mg/dL (ref 70–99)
Potassium: 4.1 mmol/L (ref 3.5–5.2)
Sodium: 141 mmol/L (ref 134–144)
eGFR: 92 mL/min/{1.73_m2} (ref >59)

## 2021-03-06 LAB — CBC WITH DIFFERENTIAL/PLATELET
Basophils Absolute: 0.1 10*3/uL (ref 0.0–0.2)
Basos: 1 %
EOS (ABSOLUTE): 0.1 10*3/uL (ref 0.0–0.4)
Eos: 1 %
Hematocrit: 33.4 % — ABNORMAL LOW (ref 37.5–51.0)
Hemoglobin: 10.6 g/dL — ABNORMAL LOW (ref 13.0–17.7)
Immature Grans (Abs): 0.1 10*3/uL (ref 0.0–0.1)
Immature Granulocytes: 1 %
Lymphocytes Absolute: 2.1 10*3/uL (ref 0.7–3.1)
Lymphs: 22 %
MCH: 27.9 pg (ref 26.6–33.0)
MCHC: 31.7 g/dL (ref 31.5–35.7)
MCV: 88 fL (ref 79–97)
Monocytes Absolute: 0.4 10*3/uL (ref 0.1–0.9)
Monocytes: 4 %
Neutrophils Absolute: 6.6 10*3/uL (ref 1.4–7.0)
Neutrophils: 71 %
Platelets: 243 10*3/uL (ref 150–450)
RBC: 3.8 x10E6/uL — ABNORMAL LOW (ref 4.14–5.80)
RDW: 13.6 % (ref 11.6–15.4)
WBC: 9.2 10*3/uL (ref 3.4–10.8)

## 2021-03-26 ENCOUNTER — Telehealth: Payer: Self-pay | Admitting: Gastroenterology

## 2021-03-26 MED ORDER — PREDNISONE 20 MG PO TABS: ORAL_TABLET | ORAL | 0 refills | Status: DC

## 2021-03-26 NOTE — Telephone Encounter (Signed)
Inbound call from patient requesting refill for prednisone until he hear about humira medication. Sent to [facility] on [address] .  ?

## 2021-03-26 NOTE — Telephone Encounter (Signed)
Ok to send refill for Prednisone 2m daily and decrease by 5 mg every 2 weeks. Please check if he got approved for Humira drug assistance? Thanks ?

## 2021-03-26 NOTE — Telephone Encounter (Signed)
[name], Do you know yet about his Humira?  I sent Prednisone  ?

## 2021-03-26 NOTE — Telephone Encounter (Signed)
Dr Silverio Decamp patient wants prednisone sent to pharmacy, Can I send it for him?  ?

## 2021-03-27 ENCOUNTER — Encounter (HOSPITAL_COMMUNITY): Payer: Self-pay | Admitting: Internal Medicine

## 2021-03-27 ENCOUNTER — Other Ambulatory Visit: Payer: Self-pay

## 2021-03-27 NOTE — Progress Notes (Signed)
PCP - Dr. Arelia Sneddon ? ?Cardiologist - Dr. Einar Gip ? ?EP- Dr. Lovena Le ? ?Endocrine- Denies ? ?Pulm- Denies ? ?Chest x-ray - 01/04/22 (E) ? ?EKG - 02/04/21 (E) ? ?Stress Test - Denies ? ?ECHO - 11/30/19 (E) ? ?Cardiac Cath - 10/28/16 (E) ? ?AICD- Yes- Medtronic ?PM- na ?LOOP- na ? ?Nerve Stimulator- Denies ? ?Dialysis- Denies ? ?Sleep Study - Denies ?CPAP - Denies ? ?LABS- 03/06/21(E): CBCw/D, BMP ?03/28/21: T/Sx2, COVID, PCR ? ?ASA- Denies ? ?ERAS- Yes- clears until 1130 ? ?HA1C- Denies ? ?Anesthesia- Yes- cardiac history- notified ? ?Pt denies having chest pain, sob, or fever during the pre-op phone call. All instructions explained to the pt, with a verbal understanding of the material including: as of today,  stop taking all Aspirin (unless instructed by your doctor) and Other Aspirin containing products, Vitamins, Fish oils, and Herbal medications. Also stop all NSAIDS i.e. Advil, Ibuprofen, Motrin, Aleve, Anaprox, Naproxen, BC, Goody Powders, and all Supplements.  Pt also instructed to wear a mask and social distance if he goes out. The opportunity to ask questions was provided.  ?

## 2021-03-27 NOTE — Progress Notes (Signed)
Leanna Sato from [employer] notified of pt's procedure tomorrow at 1500. Leanna Sato will be here.  ?

## 2021-03-27 NOTE — Anesthesia Preprocedure Evaluation (Addendum)
Anesthesia Evaluation  ?Patient identified by MRN, date of birth, ID band ?Patient awake ? ? ? ?Reviewed: ?Allergy & Precautions, NPO status , Patient's Chart, lab work & pertinent test results ? ?Airway ?Mallampati: II ? ?TM Distance: >3 FB ?Neck ROM: Full ? ? ? Dental ?no notable dental hx. ? ?  ?Pulmonary ?neg pulmonary ROS, former smoker,  ?  ?Pulmonary exam normal ?breath sounds clear to auscultation ? ? ? ? ? ? Cardiovascular ?+ CAD and + Cardiac Stents  ?Normal cardiovascular exam+ dysrhythmias Ventricular Tachycardia + Cardiac Defibrillator ? ?Rhythm:Regular Rate:Normal ? ?TEE 11/30/19: ?IMPRESSIONS  ??1. Left ventricular ejection fraction, by estimation, is 55 to 60%. The  ?left ventricle has normal function. The left ventricle has no regional  ?wall motion abnormalities. There is mild left ventricular hypertrophy.  ?Left ventricular diastolic function  ?could not be evaluated.  ??2. No thrombus or vegetation seen on the endocardial wires. Right  ?ventricular systolic function is normal. The right ventricular size is  ?normal. A endocardial wires within right chambers is visualized.  ??3. Left atrial size was moderately dilated. No left atrial/left atrial  ?appendage thrombus was detected. The LAA emptying velocity was 56 cm/s.  ??4. The mitral valve is grossly normal. Mild mitral valve regurgitation.  ?No evidence of mitral stenosis. There is mild prolapse of posterior  ?leaflet of the mitral valve.  ??5. The aortic valve is tricuspid. Aortic valve regurgitation is trivial.  ?No aortic stenosis is present ?  ?Neuro/Psych ?negative neurological ROS ? negative psych ROS  ? GI/Hepatic ?negative GI ROS, Neg liver ROS,   ?Endo/Other  ?negative endocrine ROS ? Renal/GU ?negative Renal ROS  ?negative genitourinary ?  ?Musculoskeletal ?negative musculoskeletal ROS ?(+)  ? Abdominal ?  ?Peds ?negative pediatric ROS ?(+)  Hematology ?negative hematology ROS ?(+)   ?Anesthesia Other  Findings ? ? Reproductive/Obstetrics ?negative OB ROS ? ?  ? ? ? ? ? ? ? ? ? ? ? ? ? ?  ?  ? ? ? ? ? ? ? ?Anesthesia Physical ?Anesthesia Plan ? ?ASA: 3 ? ?Anesthesia Plan: General  ? ?Post-op Pain Management: Minimal or no pain anticipated  ? ?Induction: Intravenous ? ?PONV Risk Score and Plan: 2 and Ondansetron, Dexamethasone and Treatment may vary due to age or medical condition ? ?Airway Management Planned: Oral ETT ? ?Additional Equipment: Arterial line ? ?Intra-op Plan:  ? ?Post-operative Plan: Extubation in OR ? ?Informed Consent: I have reviewed the patients History and Physical, chart, labs and discussed the procedure including the risks, benefits and alternatives for the proposed anesthesia with the patient or authorized representative who has indicated his/her understanding and acceptance.  ? ? ? ?Dental advisory given ? ?Plan Discussed with: CRNA and Surgeon ? ?Anesthesia Plan Comments: (PAT note written 03/27/2021 by Myra Gianotti, PA-C. ?)  ? ? ? ? ? ?Anesthesia Quick Evaluation ? ?

## 2021-03-27 NOTE — Telephone Encounter (Signed)
Called the patient. He has a message from [place] but has not returned the call.  ?I called [employer]. The patient needs to supply income information from the 2nd person in his household.  ?I gave this information to the patient. He will have this faxed to me. I will send the information to [employer]. ?

## 2021-03-27 NOTE — Progress Notes (Signed)
Anesthesia Chart Review: ? Case: [id number] Date/Time: 03/28/21 1445  ? Procedure: ICD GENERATOR CHANGE LV LEAD EXTRACTION AND LV LEAD REPLACEMENT  ? Anesthesia type: General  ? Pre-op diagnosis: LEAD FAILURE  ? [facility] ROOM [facility] / [facility]  ? Surgeons: Evans Lance, MD  ? ?  ? ? ?DISCUSSION: Patient is a 56 year old male scheduled for the above procedure.  He has a BiV Medtronic ICD. By notes, he developed an elevated pacing threshold and chest x-ray demonstrated dislodgment of his LV lead into the RA. Dr. Curt Bears referred him to Dr. Lovena Le for above procedure. ? ?History includes former smoker (quit 02/16/11), hypercholesterolemia, CAD (1V CAD s/p DES LCX 10/28/16), nonischemic cardiomyopathy (diagnosed 10/2016), chronic combined systolic and diastolic CHF, childhood murmur, "ventricular tachycardia" (1995 per patient), Medtronic ICD (CRT-D 03/16/17 by Dr. Virl Axe), Crohn's disease. ? ?Anesthesia team to evaluate on the day of procedure. ? ?CT surgeon Dr. Kipp Brood is backup surgeon for procedure. ? ? ?VS:  ?BP Readings from Last 3 Encounters:  ?02/04/21 (!) 108/56  ?02/04/21 92/60  ?01/03/21 106/64  ? ?Pulse Readings from Last 3 Encounters:  ?02/04/21 64  ?02/04/21 (!) 54  ?01/03/21 (!) 51  ?  ? ?PROVIDERS: ?[name], MD is PCP  ?Vernell Leep, MD is primary cardiologist ?Harl Bowie, MD is GI ? ? ?LABS: Most recent lab results include: ?Lab Results  ?Component Value Date  ? WBC 9.2 03/06/2021  ? HGB 10.6 (L) 03/06/2021  ? HCT 33.4 (L) 03/06/2021  ? PLT 243 03/06/2021  ? GLUCOSE 96 03/06/2021  ? ALT 11 02/04/2021  ? AST 14 02/04/2021  ? NA 141 03/06/2021  ? K 4.1 03/06/2021  ? CL 104 03/06/2021  ? CREATININE 0.97 03/06/2021  ? BUN 13 03/06/2021  ? CO2 22 03/06/2021  ? ? ? ?IMAGES: ?CXR 01/04/21: ?FINDINGS: ?- Cardiomediastinal silhouette unchanged in size and contour. No ?evidence of central vascular congestion. No interlobular septal ?thickening. ?- Cardiac pacing device/AICD on the  left chest wall with 3 leads. ? - Chest x-ray confirms the history, with withdrawal of the left ?ventricular lead. ?- No pneumothorax or pleural effusion. Coarsened interstitial ?markings, with no confluent airspace disease. ?- No acute displaced fracture. Degenerative changes of the spine. ?IMPRESSION: ?Chest x-ray confirms that the left ventricular lead has become ?withdrawn in the interval ? ?  ?EKG: 02/04/21: SB at 54 bpm. LBBB. ? ? ?CV: ?TEE 11/30/19: ?IMPRESSIONS  ? 1. Left ventricular ejection fraction, by estimation, is 55 to 60%. The  ?left ventricle has normal function. The left ventricle has no regional  ?wall motion abnormalities. There is mild left ventricular hypertrophy.  ?Left ventricular diastolic function  ?could not be evaluated.  ? 2. No thrombus or vegetation seen on the endocardial wires. Right  ?ventricular systolic function is normal. The right ventricular size is  ?normal. A endocardial wires within right chambers is visualized.  ? 3. Left atrial size was moderately dilated. No left atrial/left atrial  ?appendage thrombus was detected. The LAA emptying velocity was 56 cm/s.  ? 4. The mitral valve is grossly normal. Mild mitral valve regurgitation.  ?No evidence of mitral stenosis. There is mild prolapse of posterior  ?leaflet of the mitral valve.  ? 5. The aortic valve is tricuspid. Aortic valve regurgitation is trivial.  ?No aortic stenosis is present.  ? 6. Agitated saline contrast bubble study was negative, with no evidence  ?of any interatrial shunt.  ? ?RHC/LHC 10/28/16: ?Nonischemic cardiomyopathy.  ?Single-vessel obstructive coronary  artery disease proximal large OM1. ?Successful PTCA and drug-eluting stent placement Promus premier 3.5 x 16 mm. ?  ? ? ?Past Medical History:  ?Diagnosis Date  ? Bursitis of right shoulder   ? CHF (congestive heart failure) ([place])   ? [name] 10/24/2016  ? Chronic combined systolic and diastolic heart failure ([place]) 03/16/2017  ? Dilated cardiomyopathy ([place])    ? [name] 10/24/2016  ? Dysrhythmia 1995  ? "VT" per patient- states had neg stress test and no problems since  ? Encounter for adjustment or management of automatic implantable cardioverter-defibrillator 06/17/2018  ? Encounter for assessment of automatic implantable cardioverter-defibrillator (AICD) 06/17/2018  ? Heart murmur   ? "when I was young"  ? History of kidney stones   ? Hypercholesterolemia   ? ICD: Medtronic MRI Quad CRTD (Bi-V ICD) implantation 03/16/2017 [name] 03/16/2017  ? Scheduled Remote ICD check   8.7.20: No VHR episodes. No mode switches. Normal health trends. Trans-thoracic impedance trends and the OptiVol Fluid Index do no present significant abnormalities. Battery longevity is 6.5-8.3 years. RA pacing is 8.3 %, RV pacing is 99.9 %, and LV pacing is 99.9 %.  Clinic: 05/18/17  ? NICM (nonischemic cardiomyopathy) ([place]) 03/16/2017  ? ? ?Past Surgical History:  ?Procedure Laterality Date  ? BIOPSY  12/06/2018  ? Procedure: BIOPSY;  Surgeon: Mauri Pole, MD;  Location: [facility];  Service: Endoscopy;;  Egd and Colon  ? BIV ICD INSERTION CRT-D N/A 03/16/2017  ? Procedure: BIV ICD INSERTION CRT-D;  Surgeon: Deboraha Sprang, MD;  Location: [place] [facility];  Service: Cardiovascular;  Laterality: N/A;  ? BUBBLE STUDY  11/30/2019  ? Procedure: BUBBLE STUDY;  Surgeon: Rex Kras, DO;  Location: [place] [facility];  Service: Cardiovascular;;  ? COLONOSCOPY WITH PROPOFOL N/A 03/10/2017  ? Procedure: COLONOSCOPY WITH PROPOFOL;  Surgeon: Mauri Pole, MD;  Location: [facility];  Service: Endoscopy;  Laterality: N/A;  ? COLONOSCOPY WITH PROPOFOL N/A 12/06/2018  ? Procedure: COLONOSCOPY WITH PROPOFOL;  Surgeon: Mauri Pole, MD;  Location: [facility];  Service: Endoscopy;  Laterality: N/A;  ? CORONARY ANGIOPLASTY WITH STENT PLACEMENT  10/28/2016  ? CORONARY STENT INTERVENTION N/A 10/28/2016  ? Procedure: CORONARY STENT INTERVENTION;  Surgeon: [name], MD;  Location: [place] [facility];  Service: Cardiovascular;  Laterality: N/A;  ? ESOPHAGOGASTRODUODENOSCOPY (EGD) WITH PROPOFOL N/A 03/10/2017  ? Procedure: ESOPHAGOGASTRODUODENOSCOPY (EGD) WITH PROPOFOL;  Surgeon: Mauri Pole, MD;  [facility];  Service: Endoscopy;  Laterality: N/A;  ? ESOPHAGOGASTRODUODENOSCOPY (EGD) WITH PROPOFOL N/A 12/06/2018  ? Procedure: ESOPHAGOGASTRODUODENOSCOPY (EGD) WITH PROPOFOL;  Surgeon: Mauri Pole, MD;  [facility];  Service: Endoscopy;  Laterality: N/A;  ? INGUINAL HERNIA REPAIR Right 02/16/2013  ? Procedure: RIGHT INGUINAL HERNIA REPAIR ;  Surgeon: [name], MD;  Location: [facility];  Service: General;  Laterality: Right;  With MESH  ? INGUINAL HERNIA REPAIR Right 02/16/2013  ? INTRAVASCULAR PRESSURE WIRE/FFR STUDY N/A 10/28/2016  ? Procedure: INTRAVASCULAR PRESSURE WIRE/FFR STUDY;  Surgeon: [name], MD;  Location: [place] [facility];  Service: Cardiovascular;  Laterality: N/A;  ? RIGHT/LEFT HEART CATH AND CORONARY ANGIOGRAPHY N/A 10/28/2016  ? Procedure: RIGHT/LEFT HEART CATH AND CORONARY ANGIOGRAPHY;  Surgeon: [name], MD;  Location: [place] [facility];  Service: Cardiovascular;  Laterality: N/A;  ? TEE WITHOUT CARDIOVERSION N/A 11/30/2019  ? Procedure: TRANSESOPHAGEAL ECHOCARDIOGRAM (TEE);  Surgeon: Rex Kras, DO;  [facility];  Service: Cardiovascular;  Laterality: N/A;  ?  ULTRASOUND GUIDANCE FOR VASCULAR ACCESS  10/28/2016  ? Procedure: Ultrasound Guidance For Vascular Access;  Surgeon: [name], MD;  Location: [place] [facility];  Service: Cardiovascular;;  ? ? ?MEDICATIONS: ?No current facility-administered medications for this encounter.  ? ? bisoprolol (ZEBETA) 5 MG tablet  ? dicyclomine (BENTYL) 10 MG capsule  ? ENTRESTO 97-103 MG  ? furosemide (LASIX) 40 MG tablet  ? Homeopathic Products (ZICAM ALLERGY RELIEF NA)  ? HYDROcodone-acetaminophen (NORCO) 10-325 MG tablet  ? Multiple Vitamin  (MULTIVITAMIN WITH MINERALS) TABS tablet  ? nicotine polacrilex (NICORETTE) 4 MG gum  ? omega-3 acid ethyl esters (LOVAZA) 1 g capsule  ? Polyethyl Glycol-Propyl Glycol (LUBRICANT EYE DROPS) 0.4-0.3 % SOLN  ? potassium

## 2021-03-28 ENCOUNTER — Other Ambulatory Visit: Payer: Self-pay

## 2021-03-28 ENCOUNTER — Inpatient Hospital Stay (HOSPITAL_COMMUNITY): Payer: 59 | Admitting: Vascular Surgery

## 2021-03-28 ENCOUNTER — Inpatient Hospital Stay (HOSPITAL_COMMUNITY)
Admission: RE | Admit: 2021-03-28 | Discharge: 2021-03-29 | DRG: 227 | Disposition: A | Discharge status: Home / Self Care | Payer: 59 | Source: Ambulatory Visit | Attending: Internal Medicine | Admitting: Internal Medicine

## 2021-03-28 ENCOUNTER — Encounter (HOSPITAL_COMMUNITY)
Admission: RE | Disposition: A | Discharge status: Home / Self Care | Payer: 59 | Source: Ambulatory Visit | Attending: Internal Medicine

## 2021-03-28 ENCOUNTER — Inpatient Hospital Stay (HOSPITAL_COMMUNITY): Payer: 59

## 2021-03-28 ENCOUNTER — Encounter (HOSPITAL_COMMUNITY): Payer: Self-pay | Admitting: Internal Medicine

## 2021-03-28 DIAGNOSIS — T82120A Displacement of cardiac electrode, initial encounter: Secondary | ICD-10-CM | POA: Diagnosis present

## 2021-03-28 DIAGNOSIS — Z955 Presence of coronary angioplasty implant and graft: Secondary | ICD-10-CM

## 2021-03-28 DIAGNOSIS — Z95810 Presence of automatic (implantable) cardiac defibrillator: Secondary | ICD-10-CM

## 2021-03-28 DIAGNOSIS — T82110A Breakdown (mechanical) of cardiac electrode, initial encounter: Principal | ICD-10-CM | POA: Diagnosis present

## 2021-03-28 DIAGNOSIS — I472 Ventricular tachycardia, unspecified: Secondary | ICD-10-CM | POA: Diagnosis not present

## 2021-03-28 DIAGNOSIS — Z87891 Personal history of nicotine dependence: Secondary | ICD-10-CM | POA: Diagnosis not present

## 2021-03-28 DIAGNOSIS — I2510 Atherosclerotic heart disease of native coronary artery without angina pectoris: Secondary | ICD-10-CM | POA: Diagnosis present

## 2021-03-28 DIAGNOSIS — Z87442 Personal history of urinary calculi: Secondary | ICD-10-CM

## 2021-03-28 DIAGNOSIS — Z8249 Family history of ischemic heart disease and other diseases of the circulatory system: Secondary | ICD-10-CM | POA: Diagnosis not present

## 2021-03-28 DIAGNOSIS — K5090 Crohn's disease, unspecified, without complications: Secondary | ICD-10-CM | POA: Diagnosis present

## 2021-03-28 DIAGNOSIS — I5042 Chronic combined systolic (congestive) and diastolic (congestive) heart failure: Secondary | ICD-10-CM | POA: Diagnosis present

## 2021-03-28 DIAGNOSIS — E7800 Pure hypercholesterolemia, unspecified: Secondary | ICD-10-CM | POA: Diagnosis present

## 2021-03-28 DIAGNOSIS — I4891 Unspecified atrial fibrillation: Secondary | ICD-10-CM | POA: Diagnosis not present

## 2021-03-28 DIAGNOSIS — T82110D Breakdown (mechanical) of cardiac electrode, subsequent encounter: Secondary | ICD-10-CM

## 2021-03-28 DIAGNOSIS — Y831 Surgical operation with implant of artificial internal device as the cause of abnormal reaction of the patient, or of later complication, without mention of misadventure at the time of the procedure: Secondary | ICD-10-CM | POA: Diagnosis present

## 2021-03-28 DIAGNOSIS — Z7982 Long term (current) use of aspirin: Secondary | ICD-10-CM | POA: Diagnosis not present

## 2021-03-28 DIAGNOSIS — Z20822 Contact with and (suspected) exposure to covid-19: Secondary | ICD-10-CM | POA: Diagnosis present

## 2021-03-28 DIAGNOSIS — Z8371 Family history of colonic polyps: Secondary | ICD-10-CM | POA: Diagnosis not present

## 2021-03-28 DIAGNOSIS — Z79899 Other long term (current) drug therapy: Secondary | ICD-10-CM | POA: Diagnosis not present

## 2021-03-28 HISTORY — DX: Atherosclerotic heart disease of native coronary artery without angina pectoris: I25.10

## 2021-03-28 HISTORY — PX: IMPLANTABLE CARDIOVERTER DEFIBRILLATOR (ICD) GENERATOR CHANGE: SHX5469

## 2021-03-28 LAB — SURGICAL PCR SCREEN
MRSA, PCR: NEGATIVE
Staphylococcus aureus: POSITIVE — AB

## 2021-03-28 LAB — SARS CORONAVIRUS 2 BY RT PCR (HOSPITAL ORDER, PERFORMED IN ~~LOC~~ HOSPITAL LAB): SARS Coronavirus 2: NEGATIVE

## 2021-03-28 LAB — ABO/RH: ABO/RH(D): O POS

## 2021-03-28 LAB — PREPARE RBC (CROSSMATCH)

## 2021-03-28 SURGERY — ICD GENERATOR CHANGE
Anesthesia: General

## 2021-03-28 MED ORDER — SODIUM CHLORIDE 0.9 % IV SOLN
INTRAVENOUS | Status: DC | PRN
  Administered 2021-03-28: 80 mg

## 2021-03-28 MED ORDER — CHLORHEXIDINE GLUCONATE 4 % EX LIQD: 4.0000 "application " | Freq: Once | CUTANEOUS | Status: DC

## 2021-03-28 MED ORDER — CHLORHEXIDINE GLUCONATE CLOTH 2 % EX PADS
6.0000 | MEDICATED_PAD | Freq: Every day | CUTANEOUS | Status: DC
  Administered 2021-03-29: 6 via TOPICAL

## 2021-03-28 MED ORDER — OXYCODONE HCL 5 MG PO TABS: 5.0000 mg | ORAL_TABLET | Freq: Once | ORAL | Status: DC | PRN

## 2021-03-28 MED ORDER — ACETAMINOPHEN 10 MG/ML IV SOLN
1000.0000 mg | Freq: Once | INTRAVENOUS | Status: DC | PRN
  Administered 2021-03-28: 1000 mg via INTRAVENOUS

## 2021-03-28 MED ORDER — CEFAZOLIN SODIUM-DEXTROSE 1-4 GM/50ML-% IV SOLN
1.0000 g | Freq: Four times a day (QID) | INTRAVENOUS | Status: AC
  Administered 2021-03-28 – 2021-03-29 (×3): 1 g via INTRAVENOUS
  Filled 2021-03-28 (×4): qty 50

## 2021-03-28 MED ORDER — CEFAZOLIN SODIUM-DEXTROSE 2-4 GM/100ML-% IV SOLN
2.0000 g | INTRAVENOUS | Status: AC
  Administered 2021-03-28: 2 g via INTRAVENOUS
  Filled 2021-03-28: qty 100

## 2021-03-28 MED ORDER — SODIUM CHLORIDE 0.9 % IV SOLN: 80.0000 mg | INTRAVENOUS | Status: DC

## 2021-03-28 MED ORDER — SUGAMMADEX SODIUM 200 MG/2ML IV SOLN
INTRAVENOUS | Status: DC | PRN
  Administered 2021-03-28: 200 mg via INTRAVENOUS

## 2021-03-28 MED ORDER — OXYCODONE HCL 5 MG/5ML PO SOLN: 5.0000 mg | Freq: Once | ORAL | Status: DC | PRN

## 2021-03-28 MED ORDER — IOHEXOL 300 MG/ML  SOLN
INTRAMUSCULAR | Status: DC | PRN
Start: 2021-03-28 — End: 2021-03-28
  Administered 2021-03-28: 20 mL

## 2021-03-28 MED ORDER — SODIUM CHLORIDE 0.9 % IV SOLN
INTRAVENOUS | Status: AC
  Filled 2021-03-28: qty 2

## 2021-03-28 MED ORDER — POVIDONE-IODINE 10 % EX SWAB
2.0000 "application " | Freq: Once | CUTANEOUS | Status: AC
  Administered 2021-03-28: 2 via TOPICAL

## 2021-03-28 MED ORDER — ROCURONIUM BROMIDE 10 MG/ML (PF) SYRINGE
PREFILLED_SYRINGE | INTRAVENOUS | Status: DC | PRN
  Administered 2021-03-28: 30 mg via INTRAVENOUS
  Administered 2021-03-28: 70 mg via INTRAVENOUS

## 2021-03-28 MED ORDER — FENTANYL CITRATE (PF) 250 MCG/5ML IJ SOLN
INTRAMUSCULAR | Status: AC
  Filled 2021-03-28: qty 5

## 2021-03-28 MED ORDER — ACETAMINOPHEN 325 MG PO TABS: 325.0000 mg | ORAL_TABLET | ORAL | Status: DC | PRN

## 2021-03-28 MED ORDER — ONDANSETRON HCL 4 MG/2ML IJ SOLN: 4.0000 mg | Freq: Once | INTRAMUSCULAR | Status: DC | PRN

## 2021-03-28 MED ORDER — ONDANSETRON HCL 4 MG/2ML IJ SOLN
INTRAMUSCULAR | Status: DC | PRN
  Administered 2021-03-28: 4 mg via INTRAVENOUS

## 2021-03-28 MED ORDER — LIDOCAINE 2% (20 MG/ML) 5 ML SYRINGE
INTRAMUSCULAR | Status: DC | PRN
  Administered 2021-03-28: 100 mg via INTRAVENOUS

## 2021-03-28 MED ORDER — ORAL CARE MOUTH RINSE: 15.0000 mL | Freq: Once | OROMUCOSAL | Status: AC

## 2021-03-28 MED ORDER — SODIUM CHLORIDE 0.9 % IV SOLN: INTRAVENOUS | Status: DC | PRN

## 2021-03-28 MED ORDER — PHENYLEPHRINE HCL-NACL 20-0.9 MG/250ML-% IV SOLN
INTRAVENOUS | Status: DC | PRN
Start: 2021-03-28 — End: 2021-03-28
  Administered 2021-03-28: 25 ug/min via INTRAVENOUS

## 2021-03-28 MED ORDER — SODIUM CHLORIDE 0.9% IV SOLUTION: Freq: Once | INTRAVENOUS | Status: AC

## 2021-03-28 MED ORDER — MIDAZOLAM HCL 2 MG/2ML IJ SOLN
INTRAMUSCULAR | Status: DC | PRN
  Administered 2021-03-28: 2 mg via INTRAVENOUS

## 2021-03-28 MED ORDER — ONDANSETRON HCL 4 MG/2ML IJ SOLN: 4.0000 mg | Freq: Four times a day (QID) | INTRAMUSCULAR | Status: DC | PRN

## 2021-03-28 MED ORDER — GENTAMICIN SULFATE 40 MG/ML IJ SOLN
INTRAMUSCULAR | Status: DC | PRN
Start: 2021-03-28 — End: 2021-03-28
  Administered 2021-03-28: 500 mL

## 2021-03-28 MED ORDER — HEPARIN 6000 UNIT IRRIGATION SOLUTION
Status: DC | PRN
  Administered 2021-03-28: 1

## 2021-03-28 MED ORDER — PROPOFOL 10 MG/ML IV BOLUS
INTRAVENOUS | Status: AC
  Filled 2021-03-28: qty 20

## 2021-03-28 MED ORDER — HYDROCODONE-ACETAMINOPHEN 10-325 MG PO TABS
1.0000 | ORAL_TABLET | Freq: Four times a day (QID) | ORAL | Status: DC | PRN
  Administered 2021-03-29: 1 via ORAL
  Filled 2021-03-28: qty 1

## 2021-03-28 MED ORDER — SODIUM CHLORIDE 0.9 % IV SOLN: INTRAVENOUS | Status: DC

## 2021-03-28 MED ORDER — PHENYLEPHRINE 40 MCG/ML (10ML) SYRINGE FOR IV PUSH (FOR BLOOD PRESSURE SUPPORT)
PREFILLED_SYRINGE | INTRAVENOUS | Status: AC
  Filled 2021-03-28: qty 10

## 2021-03-28 MED ORDER — PROPOFOL 10 MG/ML IV BOLUS
INTRAVENOUS | Status: DC | PRN
  Administered 2021-03-28: 120 mg via INTRAVENOUS

## 2021-03-28 MED ORDER — ONDANSETRON HCL 4 MG/2ML IJ SOLN
INTRAMUSCULAR | Status: AC
  Filled 2021-03-28: qty 2

## 2021-03-28 MED ORDER — MIDAZOLAM HCL 2 MG/2ML IJ SOLN
INTRAMUSCULAR | Status: AC
  Filled 2021-03-28: qty 2

## 2021-03-28 MED ORDER — PHENYLEPHRINE 40 MCG/ML (10ML) SYRINGE FOR IV PUSH (FOR BLOOD PRESSURE SUPPORT)
PREFILLED_SYRINGE | INTRAVENOUS | Status: DC | PRN
  Administered 2021-03-28: 80 ug via INTRAVENOUS

## 2021-03-28 MED ORDER — MUPIROCIN 2 % EX OINT
1.0000 "application " | TOPICAL_OINTMENT | Freq: Two times a day (BID) | CUTANEOUS | Status: DC
  Administered 2021-03-28 – 2021-03-29 (×2): 1 via NASAL
  Filled 2021-03-28: qty 22

## 2021-03-28 MED ORDER — FENTANYL CITRATE (PF) 250 MCG/5ML IJ SOLN
INTRAMUSCULAR | Status: DC | PRN
  Administered 2021-03-28 (×3): 50 ug via INTRAVENOUS

## 2021-03-28 MED ORDER — HYDROMORPHONE HCL 1 MG/ML IJ SOLN: 0.2500 mg | INTRAMUSCULAR | Status: DC | PRN

## 2021-03-28 MED ORDER — ACETAMINOPHEN 10 MG/ML IV SOLN
INTRAVENOUS | Status: AC
  Filled 2021-03-28: qty 100

## 2021-03-28 MED ORDER — CHLORHEXIDINE GLUCONATE 0.12 % MT SOLN
15.0000 mL | Freq: Once | OROMUCOSAL | Status: AC
  Administered 2021-03-28: 15 mL via OROMUCOSAL
  Filled 2021-03-28: qty 15

## 2021-03-28 MED ORDER — DEXAMETHASONE SODIUM PHOSPHATE 10 MG/ML IJ SOLN
INTRAMUSCULAR | Status: DC | PRN
  Administered 2021-03-28: 10 mg via INTRAVENOUS

## 2021-03-28 MED ORDER — EPHEDRINE SULFATE-NACL 50-0.9 MG/10ML-% IV SOSY
PREFILLED_SYRINGE | INTRAVENOUS | Status: DC | PRN
  Administered 2021-03-28: 5 mg via INTRAVENOUS

## 2021-03-28 MED ORDER — LACTATED RINGERS IV SOLN: INTRAVENOUS | Status: DC

## 2021-03-28 SURGICAL SUPPLY — 60 items
BAG BANDED W/RUBBER/TAPE 36X54 (MISCELLANEOUS) IMPLANT
BAG COUNTER SPONGE SURGICOUNT (BAG) ×6 IMPLANT
BAG DECANTER FOR FLEXI CONT (MISCELLANEOUS) IMPLANT
BAG EQP BAND 135X91 W/RBR TAPE (MISCELLANEOUS)
BAG SPNG CNTER NS LX DISP (BAG) ×3
BLADE CLIPPER SURG (BLADE) IMPLANT
BLADE OSCILLATING /SAGITTAL (BLADE) IMPLANT
BLADE STERNUM SYSTEM 6 (BLADE) IMPLANT
BNDG ADH 5X4 AIR PERM ELC (GAUZE/BANDAGES/DRESSINGS) ×1
BNDG COHESIVE 4X5 WHT NS (GAUZE/BANDAGES/DRESSINGS) ×2 IMPLANT
CANISTER SUCT 3000ML PPV (MISCELLANEOUS) ×2 IMPLANT
CATH ATTAIN COM SURV 6250V-MB2 (CATHETERS) ×1 IMPLANT
CATH FOLEY 16FR TEMP PROBE (CATHETERS) ×2 IMPLANT
COVER BACK TABLE 60X90IN (DRAPES) ×2 IMPLANT
DRAPE C-ARM 42X72 X-RAY (DRAPES) IMPLANT
DRAPE CARDIOVASCULAR INCISE (DRAPES) ×2
DRAPE HALF SHEET 40X57 (DRAPES) ×4 IMPLANT
DRAPE INCISE IOBAN 66X45 STRL (DRAPES) IMPLANT
DRAPE SRG 135X102X78XABS (DRAPES) ×1 IMPLANT
DRSG COVADERM PLUS 2X2 (GAUZE/BANDAGES/DRESSINGS) ×1 IMPLANT
DRSG TEGADERM 4X10 (GAUZE/BANDAGES/DRESSINGS) ×1 IMPLANT
DRSG TEGADERM 4X4.75 (GAUZE/BANDAGES/DRESSINGS) ×1 IMPLANT
ELECT REM PT RETURN 9FT ADLT (ELECTROSURGICAL) ×4
ELECTRODE REM PT RTRN 9FT ADLT (ELECTROSURGICAL) ×2 IMPLANT
FELT TEFLON 1X6 (MISCELLANEOUS) IMPLANT
GAUZE 4X4 16PLY ~~LOC~~+RFID DBL (SPONGE) IMPLANT
GAUZE SPONGE 4X4 12PLY STRL (GAUZE/BANDAGES/DRESSINGS) ×2 IMPLANT
GLOVE SURG LTX SZ8 (GLOVE) ×2 IMPLANT
GLOVE SURG UNDER POLY LF SZ7.5 (GLOVE) ×2 IMPLANT
GOWN STRL REUS W/ TWL LRG LVL3 (GOWN DISPOSABLE) ×1 IMPLANT
GOWN STRL REUS W/ TWL XL LVL3 (GOWN DISPOSABLE) ×1 IMPLANT
GOWN STRL REUS W/TWL LRG LVL3 (GOWN DISPOSABLE) ×2
GOWN STRL REUS W/TWL XL LVL3 (GOWN DISPOSABLE) ×2
ICD CLARIA MRI DTMA1QQ (ICD Generator) ×1 IMPLANT
KIT TURNOVER KIT B (KITS) ×2 IMPLANT
KIT WRENCH (KITS) ×1 IMPLANT
LEAD ATTAIN PERFORM ST 4398-88 (Lead) ×1 IMPLANT
NDL PERC 18GX7CM (NEEDLE) IMPLANT
NEEDLE PERC 18GX7CM (NEEDLE) ×2 IMPLANT
NS IRRIG 1000ML POUR BTL (IV SOLUTION) IMPLANT
PAD ARMBOARD 7.5X6 YLW CONV (MISCELLANEOUS) ×4 IMPLANT
PAD ELECT DEFIB RADIOL ZOLL (MISCELLANEOUS) ×2 IMPLANT
POUCH AIGIS-R ANTIBACT ICD (Mesh General) ×2 IMPLANT
POUCH AIGIS-R ANTIBACT ICD LRG (Mesh General) IMPLANT
SHEATH EVOLUTION RL 9F (SHEATH) ×1 IMPLANT
SHEATH EVOLUTION SHORTE RL 11F (SHEATH) ×1 IMPLANT
SHEATH EVOLUTION SHORTIE RL 9F (SHEATH) ×1 IMPLANT
SLITTER 6232ADJ (MISCELLANEOUS) ×1 IMPLANT
STRIP CLOSURE SKIN 1/2X4 (GAUZE/BANDAGES/DRESSINGS) ×1 IMPLANT
STYLET LIBERATOR LOCKING (MISCELLANEOUS) ×1 IMPLANT
SUT PROLENE 2 0 CT2 30 (SUTURE) IMPLANT
SUT PROLENE 2 0 SH DA (SUTURE) IMPLANT
SUT VIC AB 2-0 CT2 18 VCP726D (SUTURE) IMPLANT
SUT VIC AB 3-0 X1 27 (SUTURE) IMPLANT
TOWEL GREEN STERILE (TOWEL DISPOSABLE) ×4 IMPLANT
TOWEL GREEN STERILE FF (TOWEL DISPOSABLE) ×4 IMPLANT
TRAY FOLEY MTR SLVR 16FR STAT (SET/KITS/TRAYS/PACK) ×2 IMPLANT
TUBE CONNECTING 12X1/4 (SUCTIONS) IMPLANT
WIRE MAILMAN 182CM (WIRE) ×2 IMPLANT
YANKAUER SUCT BULB TIP NO VENT (SUCTIONS) IMPLANT

## 2021-03-28 NOTE — Anesthesia Postprocedure Evaluation (Signed)
Anesthesia Post Note ? ?Patient: Miguel Pena ? ?Procedure(s) Performed: ICD GENERATOR CHANGE LV LEAD EXTRACTION AND LV LEAD REPLACEMENT ? ?  ? ?Patient location during evaluation: PACU ?Anesthesia Type: General ?Level of consciousness: awake and alert ?Pain management: pain level controlled ?Vital Signs Assessment: post-procedure vital signs reviewed and stable ?Respiratory status: spontaneous breathing, nonlabored ventilation, respiratory function stable and patient connected to nasal cannula oxygen ?Cardiovascular status: blood pressure returned to baseline and stable ?Postop Assessment: no apparent nausea or vomiting ?Anesthetic complications: no ? ? ?No notable events documented. ? ?Last Vitals:  ?Vitals:  ? 03/28/21 1640 03/28/21 1655  ?BP: 101/61 99/60  ?Pulse: 80 79  ?Resp: 14 14  ?Temp:    ?SpO2: 94% 96%  ?  ?Last Pain:  ?Vitals:  ? 03/28/21 1655  ?TempSrc:   ?PainSc: 0-No pain  ? ? ?  ?  ?  ?  ?  ?  ? ?Haylyn Halberg S ? ? ? ? ?

## 2021-03-28 NOTE — Anesthesia Procedure Notes (Signed)
Arterial Line Insertion ?Start/End3/23/2023 1:10 PM, 03/28/2021 1:15 PM ?Performed by: Inda Coke, CRNA, CRNA ? Patient location: Pre-op. ?Preanesthetic checklist: patient identified, IV checked, site marked, risks and benefits discussed, surgical consent, monitors and equipment checked, pre-op evaluation, timeout performed and anesthesia consent ?Lidocaine 1% used for infiltration ?Right, radial was placed ?Catheter size: 20 G ?Hand hygiene performed  and maximum sterile barriers used  ?Allen's test indicative of satisfactory collateral circulation ?Attempts: 1 ?Procedure performed without using ultrasound guided technique. ?Following insertion, dressing applied and Biopatch. ?Post procedure assessment: normal ? ?Patient tolerated the procedure well with no immediate complications. ? ? ?

## 2021-03-28 NOTE — Transfer of Care (Signed)
Immediate Anesthesia Transfer of Care Note ? ?Patient: Miguel Pena ? ?Procedure(s) Performed: ICD GENERATOR CHANGE LV LEAD EXTRACTION AND LV LEAD REPLACEMENT ? ?Patient Location: PACU ? ?Anesthesia Type:General ? ?Level of Consciousness: awake, alert  and oriented ? ?Airway & Oxygen Therapy: Patient Spontanous Breathing and Patient connected to face mask oxygen ? ?Post-op Assessment: Report given to RN and Post -op Vital signs reviewed and stable ? ?Post vital signs: Reviewed and stable ? ?Last Vitals:  ?Vitals Value Taken Time  ?BP 113/61 03/28/21 1625  ?Temp 36.4 ?C 03/28/21 1625  ?Pulse 83 03/28/21 1634  ?Resp 15 03/28/21 1634  ?SpO2 96 % 03/28/21 1634  ?Vitals shown include unvalidated device data. ? ?Last Pain:  ?Vitals:  ? 03/28/21 1625  ?TempSrc:   ?PainSc: 0-No pain  ?   ? ?Patients Stated Pain Goal: 2 (03/28/21 1235) ? ?Complications: No notable events documented. ?

## 2021-03-28 NOTE — Op Note (Signed)
EP procedure note ? ?Procedure performed: Extraction of a left ventricular pacing lead which had dislodged into the right atrium, insertion of a new left ventricular pacing lead via the coronary sinus, removal of a previously implanted biventricular ICD which had less than 3 years of longevity and insertion of a new biventricular ICD, with relocation of the ICD pocket to a subpectoral location. ? ?Preoperative diagnosis: Dislodged left ventricular pacing lead ? ?Postoperative diagnosis: Same as preoperative diagnosis ? ?Description of the procedure: After informed consent was obtained, the patient was taken to the operating room in the fasting state.  The anesthesia service was utilized to provide general endotracheal anesthesia.  The anesthesia service was utilized to provide invasive hemodynamic monitoring with an art line in the right radial artery.  After the appropriate timeouts were obtained, a 6 French sheath was inserted percutaneously in the right femoral vein for central venous access.  Attention was then turned to the ICD system. ? ?A 5 cm incision was carried out.  Electrocautery was utilized to dissect down to the ICD generator which was removed with gentle traction.  Electrocautery was utilized to free up the dense fibrous scar tissue around the atrial, ICD, and LV leads.  The sewing sleeves were freed up from their silk suture utilizing a scalpel and electrocautery.  The angioplasty 014 guidewire was inserted into the body of the left ventricular lead which had dislodged back into the right atrium.  Traction was placed on the lead after the 014 wire was advanced into the inferior vena cava.  The left ventricular lead was stuck and down to the atrial and defibrillation leads.  At this point the [place] short extraction sheath was advanced over the left ventricular pacing lead to the innominate vein.  The short extraction sheath was removed and the Cook long RL extraction sheath was advanced  over the lead and into the right atrium.  The lead remained stuck in place.  The angioplasty guidewire was removed and the [facility] locking stylette was inserted into the body of the lead.  The 9 French long [place] RL sheath was reinserted back over the lead and advanced down to the right atrium and the left ventricular lead was removed in total.  At this point the [facility] wire was advanced into the inferior vena cava.  A 9-1/2 French sheath was advanced over the [place] wire into the innominate vein.  The Medtronic MB 2 guiding catheter was advanced into the right atrium.  A 6 French hexapod EP catheter was then advanced through the guiding catheter and into the coronary sinus.  Venography of the coronary sinus was carried out demonstrating a posterior vein, middle cardiac vein and a lateral vein.  Previously the posterior vein had been utilized for LV lead placement.  The angioplasty guidewire was advanced into the posterior vein and attempts to advance the LV lead over the angioplasty guidewire were unsuccessful as there was a stenosis at the posterior vein coronary sinus junction.  The angioplasty guidewire was then advanced into the lateral vein and the Medtronic left ventricular pacing lead, serial number [id number], was advanced over the angioplasty guidewire and into the lateral vein of the left ventricle.  The lead was secured to the fascia with silk suture.  The guiding catheter was liberated in the usual manner.  The pacing threshold was satisfactory in both the distal and second electrode.  10 V pacing could not be assessed secondary to the patient's general endotracheal anesthesia.  The  lead was secured to the fascia with silk suture and the sewing sleeve was secured with silk suture.  The pocket was irrigated.  At this point attention was made to relocation of the ICD pocket to a subpectoral location. ? ?A combination of blunt and sharp dissection was utilized and dissection was carried out down to the  pectoralis minor.  A subpectoral pocket was made.  The Medtronic [place] a biventricular ICD, serial number [id number], was connected to the new left ventricular lead as well as the old atrial and old ICD leads.  The ICD and leads were placed in the subpectoral pocket.  The pocket was irrigated with antibiotic irrigation.  The pectoralis major was reapproximated with 2-0 Vicryl suture in the deep tissue and skin also closed with 3-0 Vicryl suture.  Benzoin and Steri-Strips were painted on the skin.  The 6 French sheath placed in the right femoral vein was removed and hemostasis was assured.  The skin was prepped with benzoin and Steri-Strips were applied.  The patient was returned to the recovery room in stable condition. ? ?Complications: There were no immediate procedure complications ? ? ?Conclusion: Successful extraction of a left ventricular pacing lead which had dislodged into the right atrium, followed by removal of a previously implanted biventricular ICD, followed by insertion of a new biventricular ICD with relocation to a subpectoral pocket.  There were no immediate procedural complications. ? ?Cristopher Peru, MD ?

## 2021-03-28 NOTE — H&P (Signed)
?  ?  ?  ?HPI ?Miguel Pena is referred by Dr. Curt Bears for evaluation of late LV lead dislodgement. He is a pleasant middle aged man with a non-ischemic CM, s/p Biv ICD insertion who developed an elevated pacing threshold and a CXR demonstrates dislodgement of his LV lead into the RA. He feels weaker. He has a h/o normalization of his LV function after biv ICD insertion.  ?No Known Allergies ?  ?  ?      ?Current Outpatient Medications  ?Medication Sig Dispense Refill  ? aspirin EC 81 MG tablet Take 1 tablet (81 mg total) by mouth daily. Swallow whole. 90 tablet 3  ? bisoprolol (ZEBETA) 5 MG tablet TAKE 1 TABLET BY MOUTH EVERY DAY 90 tablet 3  ? dicyclomine (BENTYL) 10 MG capsule TAKE ONE CAPSULE BY MOUTH THREE TIMES DAILY(NOON, 4:00 PM AND EVENING) 90 capsule 3  ? diphenhydrAMINE (BENADRYL) 25 MG tablet Take 25 mg by mouth every 6 (six) hours as needed.      ? ENTRESTO 97-103 MG TAKE 1 TABLET BY MOUTH TWICE DAILY 180 tablet 3  ? furosemide (LASIX) 40 MG tablet Take 1 tablet (40 mg total) by mouth every other day. 45 tablet 3  ? Homeopathic Products (ZICAM ALLERGY RELIEF NA) Place 1 spray into the nose daily as needed (allergies).      ? HUMIRA PEN 40 MG/0.4ML PNKT Inject 40 mg into the skin every 14 (fourteen) days. Begin on day 29 then every 14 days maintenance dose Dx K50.919 2 each 11  ? HYDROcodone-acetaminophen (NORCO) 10-325 MG tablet Take 1 tablet by mouth every 4 (four) hours as needed for moderate pain.       ? Multiple Vitamins-Minerals (HAIR/SKIN/NAILS) CAPS Take 2 capsules by mouth daily.      ? nicotine polacrilex (NICORETTE) 4 MG gum Take 4 mg by mouth as needed for smoking cessation.      ? omega-3 acid ethyl esters (LOVAZA) 1 g capsule Take 2 g by mouth 2 (two) times daily.      ? pantoprazole (PROTONIX) 40 MG tablet Take 1 tablet (40 mg total) by mouth daily. Before breakfast 90 tablet 3  ? potassium chloride SA (KLOR-CON) 20 MEQ tablet TAKE 1 TABLET BY MOUTH DAILY 90 tablet 3  ? rosuvastatin  (CRESTOR) 40 MG tablet TAKE 1 TABLET(40 MG) BY MOUTH DAILY 90 tablet 3  ? spironolactone (ALDACTONE) 25 MG tablet TAKE 1 TABLET BY MOUTH EVERY DAY 90 tablet 1  ?  ?No current facility-administered medications for this visit.  ?  ?  ?  ?    ?Past Medical History:  ?Diagnosis Date  ? Bursitis of right shoulder    ? CHF (congestive heart failure) ([place])    ?  [name] 10/24/2016  ? Chronic combined systolic and diastolic heart failure ([place]) 03/16/2017  ? Dilated cardiomyopathy ([place])    ?  [name] 10/24/2016  ? Dysrhythmia 1995  ?  "VT" per patient- states had neg stress test and no problems since  ? Encounter for adjustment or management of automatic implantable cardioverter-defibrillator 06/17/2018  ? Encounter for assessment of automatic implantable cardioverter-defibrillator (AICD) 06/17/2018  ? Heart murmur    ?  "when I was young"  ? History of kidney stones    ? Hypercholesterolemia    ? ICD: Medtronic MRI Quad CRTD (Bi-V ICD) implantation 03/16/2017 [name] 03/16/2017  ?  Scheduled Remote ICD check   8.7.20: No VHR episodes. No mode switches. Normal health trends. Trans-thoracic impedance  trends and the OptiVol Fluid Index do no present significant abnormalities. Battery longevity is 6.5-8.3 years. RA pacing is 8.3 %, RV pacing is 99.9 %, and LV pacing is 99.9 %.  Clinic: 05/18/17  ? NICM (nonischemic cardiomyopathy) ([place]) 03/16/2017  ?  ?  ?ROS: ?  ? All systems reviewed and negative except as noted in the HPI. ?  ?  ?     ?Past Surgical History:  ?Procedure Laterality Date  ? BIOPSY   12/06/2018  ?  Procedure: BIOPSY;  Surgeon: Mauri Pole, MD;  Location: [facility];  Service: Endoscopy;;  Egd and Colon  ? BIV ICD INSERTION CRT-D N/A 03/16/2017  ?  Procedure: BIV ICD INSERTION CRT-D;  Surgeon: Deboraha Sprang, MD;  Location: [facility];  Service: Cardiovascular;  Laterality: N/A;  ? BUBBLE STUDY   11/30/2019  ?  Procedure: BUBBLE STUDY;  Surgeon: Rex Kras, DO;  Location: [facility];  Service:  Cardiovascular;;  ? COLONOSCOPY WITH PROPOFOL N/A 03/10/2017  ?  Procedure: COLONOSCOPY WITH PROPOFOL;  Surgeon: Mauri Pole, MD;  Location: [facility];  Service: Endoscopy;  Laterality: N/A;  ? COLONOSCOPY WITH PROPOFOL N/A 12/06/2018  ?  Procedure: COLONOSCOPY WITH PROPOFOL;  Surgeon: Mauri Pole, MD;  [facility]: [facility];  Service: Endoscopy;  Laterality: N/A;  ? CORONARY ANGIOPLASTY WITH STENT PLACEMENT   10/28/2016  ? CORONARY STENT INTERVENTION N/A 10/28/2016  ?  Procedure: CORONARY STENT INTERVENTION;  Surgeon: [name], MD;  Location: [facility];  Service: Cardiovascular;  Laterality: N/A;  ? ESOPHAGOGASTRODUODENOSCOPY (EGD) WITH PROPOFOL N/A 03/10/2017  ?  Procedure: ESOPHAGOGASTRODUODENOSCOPY (EGD) WITH PROPOFOL;  Surgeon: Mauri Pole, MD;  [facility];  Service: Endoscopy;  Laterality: N/A;  ? ESOPHAGOGASTRODUODENOSCOPY (EGD) WITH PROPOFOL N/A 12/06/2018  ?  Procedure: ESOPHAGOGASTRODUODENOSCOPY (EGD) WITH PROPOFOL;  Surgeon: Mauri Pole, MD;  [facility];  Service: Endoscopy;  Laterality: N/A;  ? INGUINAL HERNIA REPAIR Right 02/16/2013  ?  Procedure: RIGHT INGUINAL HERNIA REPAIR ;  Surgeon: [name], MD;  Location: [facility];  Service: General;  Laterality: Right;  With MESH  ? INGUINAL HERNIA REPAIR Right 02/16/2013  ? INTRAVASCULAR PRESSURE WIRE/FFR STUDY N/A 10/28/2016  ?  Procedure: INTRAVASCULAR PRESSURE WIRE/FFR STUDY;  Surgeon: [name], MD;  Location: [facility];  Service: Cardiovascular;  Laterality: N/A;  ? RIGHT/LEFT HEART CATH AND CORONARY ANGIOGRAPHY N/A 10/28/2016  ?  Procedure: RIGHT/LEFT HEART CATH AND CORONARY ANGIOGRAPHY;  Surgeon: [name], MD;  Location: [facility];  Service: Cardiovascular;  Laterality: N/A;  ? TEE WITHOUT CARDIOVERSION N/A 11/30/2019  ?  Procedure: TRANSESOPHAGEAL ECHOCARDIOGRAM (TEE);  Surgeon: Rex Kras, DO;  Location: [facility];  Service:  Cardiovascular;  Laterality: N/A;  ? ULTRASOUND GUIDANCE FOR VASCULAR ACCESS   10/28/2016  ?  Procedure: Ultrasound Guidance For Vascular Access;  Surgeon: [name], MD;  Location: [facility];  Service: Cardiovascular;;  ?  ?  ?  ?     ?Family History  ?Problem Relation Age of Onset  ? Heart attack Father    ? Colon polyps Father    ? Colon polyps Brother    ? Colon cancer Neg Hx    ? Stomach cancer Neg Hx    ? Pancreatic cancer Neg Hx    ? Esophageal cancer Neg Hx    ? Liver disease Neg Hx    ?  ?  ?  ?Social History  ?  ?     ?  Socioeconomic History  ? Marital status: Married  ?    Spouse name: Not on file  ? Number of children: 2  ? Years of education: Not on file  ? Highest education level: Not on file  ?Occupational History  ? Occupation: Emergency planning/management officer  ?    Comment: [employer]  ?Tobacco Use  ? Smoking status: Former  ?    Packs/day: 1.00  ?    Years: 12.00  ?    Pack years: 12.00  ?    Types: Cigarettes, Cigars  ?    Quit date: 02/16/2011  ?    Years since quitting: 9.9  ? Smokeless tobacco: Former  ?    Types: Snuff  ? Tobacco comments:  ?    10/28/2016 'chewed for a couple years after I quit smoking"  ?Vaping Use  ? Vaping Use: Never used  ?Substance and Sexual Activity  ? Alcohol use: No  ? Drug use: No  ? Sexual activity: Not Currently  ?Other Topics Concern  ? Not on file  ?Social History Narrative  ? Not on file  ?  ?Social Determinants of Health  ?  ?Financial Resource Strain: Not on file  ?Food Insecurity: Not on file  ?Transportation Needs: Not on file  ?Physical Activity: Not on file  ?Stress: Not on file  ?Social Connections: Not on file  ?Intimate Partner Violence: Not on file  ?  ?  ?  ?BP 92/60   Pulse (!) 54   Ht 5' 10"  (1.778 m)   Wt 190 lb (86.2 kg)   SpO2 97%   BMI 27.26 kg/m?  ?  ?Physical Exam: ?  ?Well appearing middle aged man, NAD ?HEENT: Unremarkable ?Neck:  No JVD, no thyromegally ?Lymphatics:  No adenopathy ?Back:  No CVA tenderness ?Lungs:  Clear with no  wheezes ?HEART:  Regular rate rhythm, no murmurs, no rubs, no clicks ?Abd:  soft, positive bowel sounds, no organomegally, no rebound, no guarding ?Ext:  2 plus pulses, no edema, no cyanosis, no clubbing ?Skin:  No

## 2021-03-28 NOTE — Anesthesia Procedure Notes (Signed)
Procedure Name: Intubation ?Date/Time: 03/28/2021 1:47 PM ?Performed by: Inda Coke, CRNA ?Pre-anesthesia Checklist: Patient identified, Emergency Drugs available, Suction available and Patient being monitored ?Patient Re-evaluated:Patient Re-evaluated prior to induction ?Oxygen Delivery Method: Circle System Utilized ?Preoxygenation: Pre-oxygenation with 100% oxygen ?Induction Type: IV induction ?Ventilation: Mask ventilation without difficulty ?Laryngoscope Size: Mac and 4 ?Grade View: Grade I ?Tube type: Oral ?Tube size: 7.5 mm ?Number of attempts: 1 ?Airway Equipment and Method: Stylet and Oral airway ?Placement Confirmation: ETT inserted through vocal cords under direct vision, positive ETCO2 and breath sounds checked- equal and bilateral ?Secured at: 22 cm ?Tube secured with: Tape ?Dental Injury: Teeth and Oropharynx as per pre-operative assessment  ? ? ? ? ?

## 2021-03-28 NOTE — Progress Notes (Signed)
Dr. Kalman Shan made aware that patient chewed Nicorette Gum at 0600 and that patient did not take Bisoprolol this morning. Ok to hold today's dose of Bisoprolol per Dr. Kalman Shan.  ?

## 2021-03-28 NOTE — Progress Notes (Addendum)
?  [address] ?      [place] [address] ?            [phone]      ?  ?I was immediately available without any other clinical responsibilities for this patient's lead extraction.  I was on back up for 60 minutes ?  ?Miguel Pena ? ?

## 2021-03-28 NOTE — Progress Notes (Signed)
Patient transferred from PACU at 1800hrs.  Left shoulder dressing CDI, right groin site level zero.  Oriented to unit and plan of care for shift.  Patient verbalized understanding.  ?

## 2021-03-29 ENCOUNTER — Inpatient Hospital Stay (HOSPITAL_COMMUNITY): Payer: 59

## 2021-03-29 MED ORDER — ACETAMINOPHEN 325 MG PO TABS: 325.0000 mg | ORAL_TABLET | ORAL | Status: DC | PRN

## 2021-03-29 MED ORDER — PHENAZOPYRIDINE HCL 200 MG PO TABS
200.0000 mg | ORAL_TABLET | Freq: Once | ORAL | Status: AC
  Administered 2021-03-29: 200 mg via ORAL
  Filled 2021-03-29: qty 1

## 2021-03-29 NOTE — Discharge Summary (Addendum)
? ? ? ?ELECTROPHYSIOLOGY PROCEDURE DISCHARGE SUMMARY  ? ? ?Patient ID: Miguel Pena,  ?MRN: [id number], DOB/AGE: April 14, 1965 56 y.o. ? ?Admit date: 03/28/2021 ?Discharge date: 03/29/2021 ? ?Primary Care Physician: [name], MD  ?Primary Cardiologist: None  ?Electrophysiologist: Dr. [name] ? ?Primary Diagnosis:  ?ICD lead malfunction ?Late LV lead dislodgement ? ?Secondary Diagnosis: ?NICM ?Chronic systolic CHF ? ?No Known Allergies ? ? ?Procedures This Admission:  ?Successful extraction of a left ventricular pacing lead which had dislodged into the right atrium, followed by removal of a previously implanted biventricular ICD, followed by insertion of a new biventricular ICD with relocation to a subpectoral pocket.  There were no immediate procedural complications. ?2.  CXR on 03/29/21  demonstrated no pneumothorax status post device implantation. ? ?Brief HPI: ?Miguel Pena is a 56 y.o. male was  referred to Dr. Lovena Le in the outpatient setting due to LV lead dislodgement  for consideration extraction and revision of LV lead.   Past medical history includes above.  The patient had improvement of his LV dysfunction with CRT, so was felt important to revise his system. Risks, benefits, and alternatives to extraction and system revision were reviewed with the patient who wished to proceed.  ? ?Hospital Course:  ?The patient was admitted and underwent extraction and revision of a Medtronic BiV ICD with details as outlined above. They were monitored on telemetry overnight which demonstrated appropriate pacing. EKG am of discharge with satisfying result.  Left chest was without hematoma or ecchymosis.  The device was interrogated and found to be functioning normally.  CXR was obtained and demonstrated no pneumothorax status post device implantation..  Wound care, arm mobility, and restrictions were reviewed with the patient.  The patient was examined and considered stable for discharge to home.  ? ?The  patient's discharge medications include an ACE-I/ARB/ARNI Delene Loll) and beta blocker (Bisoprolol).  ? ?Physical Exam: ?Vitals:  ? 03/28/21 1830 03/28/21 1900 03/28/21 2100 03/29/21 0444  ?BP: 105/64 99/64 115/60 123/73  ?Pulse: 84 73 75 63  ?Resp: 20 18 18 18   ?Temp:   98.3 ?F (36.8 ?C) 98.7 ?F (37.1 ?C)  ?TempSrc:   Oral Oral  ?SpO2: 96% 96% 97% 96%  ?Weight:      ?Height:      ? ? ?GEN- The patient is well appearing, alert and oriented x 3 today.   ?HEENT: normocephalic, atraumatic; sclera clear, conjunctiva pink; hearing intact; oropharynx clear; neck supple, no JVP ?Lymph- no cervical lymphadenopathy ?Lungs- Clear to ausculation bilaterally, normal work of breathing.  No wheezes, rales, rhonchi ?Heart- Regular rate and rhythm, no murmurs, rubs or gallops, PMI not laterally displaced ?GI- soft, non-tender, non-distended, bowel sounds present, no hepatosplenomegaly ?Extremities- no clubbing, cyanosis, or edema; DP/PT/radial pulses 2+ bilaterally ?MS- no significant deformity or atrophy ?Skin- warm and dry, no rash or lesion. ICD site stable. ?Psych- euthymic mood, full affect ?Neuro- strength and sensation are intact ? ? ?Labs: ?  ?Lab Results  ?Component Value Date  ? WBC 9.2 03/06/2021  ? HGB 10.6 (L) 03/06/2021  ? HCT 33.4 (L) 03/06/2021  ? MCV 88 03/06/2021  ? PLT 243 03/06/2021  ? No results for input(s): NA, K, CL, CO2, BUN, CREATININE, CALCIUM, PROT, BILITOT, ALKPHOS, ALT, AST, GLUCOSE in the last 168 hours. ? ?Invalid input(s): LABALBU ? ?Discharge Medications:  ?Allergies as of 03/29/2021   ?No Known Allergies ?  ? ?  ?Medication List  ?  ? ?STOP taking these medications   ? ?aspirin EC  81 MG tablet ?  ? ?  ? ?TAKE these medications   ? ?acetaminophen 325 MG tablet ?Commonly known as: TYLENOL ?Take 1-2 tablets (325-650 mg total) by mouth every 4 (four) hours as needed for mild pain. ?  ?bisoprolol 5 MG tablet ?Commonly known as: ZEBETA ?TAKE 1 TABLET BY MOUTH EVERY DAY ?  ?dicyclomine 10 MG  capsule ?Commonly known as: BENTYL ?TAKE ONE CAPSULE BY MOUTH THREE TIMES DAILY(NOON, 4:00 PM AND EVENING) ?What changed: See the new instructions. ?  ?Entresto 97-103 MG ?Generic drug: sacubitril-valsartan ?TAKE 1 TABLET BY MOUTH TWICE DAILY ?  ?furosemide 40 MG tablet ?Commonly known as: LASIX ?Take 1 tablet (40 mg total) by mouth every other day. ?What changed:  ?when to take this ?reasons to take this ?  ?Hair/Skin/Nails Caps ?Take 2 capsules by mouth daily. ?  ?Humira Pen 40 MG/0.4ML Pnkt ?Generic drug: Adalimumab ?Inject 40 mg into the skin every 14 (fourteen) days. Dx M57.846 ?  ?HYDROcodone-acetaminophen 10-325 MG tablet ?Commonly known as: NORCO ?Take 1 tablet by mouth 6 (six) times daily. ?  ?Lubricant Eye Drops 0.4-0.3 % Soln ?Generic drug: Polyethyl Glycol-Propyl Glycol ?Place 1-2 drops into both eyes 3 (three) times daily as needed (dry/irritated eyes.). ?  ?multivitamin with minerals Tabs tablet ?Take 1 tablet by mouth in the morning. ?  ?nicotine polacrilex 4 MG gum ?Commonly known as: NICORETTE ?Take 4 mg by mouth as needed for smoking cessation. ?  ?omega-3 acid ethyl esters 1 g capsule ?Commonly known as: LOVAZA ?Take 1 g by mouth 2 (two) times daily. ?  ?pantoprazole 40 MG tablet ?Commonly known as: PROTONIX ?Take 1 tablet (40 mg total) by mouth daily. Before breakfast ?  ?potassium chloride SA 20 MEQ tablet ?Commonly known as: KLOR-CON M ?TAKE 1 TABLET BY MOUTH DAILY ?What changed:  ?how to take this ?when to take this ?  ?predniSONE 20 MG tablet ?Commonly known as: DELTASONE ?Take one 20 mg daily,  decrease down by 5 mg every 2 weeks ?  ?rosuvastatin 40 MG tablet ?Commonly known as: CRESTOR ?TAKE 1 TABLET(40 MG) BY MOUTH DAILY ?What changed: See the new instructions. ?  ?spironolactone 25 MG tablet ?Commonly known as: ALDACTONE ?TAKE 1 TABLET BY MOUTH EVERY DAY ?  ?ZICAM ALLERGY RELIEF NA ?Place 1 spray into the nose daily as needed (allergies). ?  ? ?  ? ? ?Disposition:  ? ? Follow-up  Information   ? ? [facility] Follow up.   ?Why: on 4/5 at 1120 post defibrillator revision check ?Contact information: ?[address] ?[place] [address] ?[phone] ? ?  ?  ? ?  ?  ? ?  ? ? ?Duration of Discharge Encounter: Greater than 30 minutes including physician time. ? ?Signed, ?Shirley Friar, PA-C  ?03/29/2021 ?8:38 AM ? ?EP Attending ? ?Patient seen and examined. Agree with the findings as noted above. The patient is doing well after biv ICD LV lead extraction and re-insertion of a new biv lead in the lateral vein of the LV. His wound is without hematoma and his ICD interrogation under my direction demonstrates normal biv ICD function. His CXR demonstrates stable lead position. He will be discharged home with usual followup. ? ?Carleene Overlie Leonard Hendler,MD ? ? ? ? ?

## 2021-03-29 NOTE — Telephone Encounter (Signed)
Spoke with the patient. He will bring in the financial information next week. Spouse is unable to fax the information. ?

## 2021-03-29 NOTE — Discharge Instructions (Signed)
After Your ICD ?(Implantable Cardiac Defibrillator) ? ? ?You have a Medtronic ICD ? ?ACTIVITY ?Do not lift your arm above shoulder height for 1 week after your procedure. After 7 days, you may progress as below.  ?You should remove your sling 24 hours after your procedure, unless otherwise instructed by your provider.  ? ? ? Friday April 05, 2021  Saturday April 06, 2021 Sunday April 07, 2021 Monday April 08, 2021  ? ?Do not lift, push, pull, or carry anything over 10 pounds with the affected arm until 6 weeks (Friday May 10, 2021 ) after your procedure.  ? ?You may drive AFTER your wound check, unless you have been told otherwise by your provider.  ? ?Ask your healthcare provider when you can go back to work ? ? ?INCISION/Dressing ?If you are on a blood thinner such as Coumadin, Xarelto, Eliquis, Plavix, or Pradaxa please confirm with your provider when this should be resumed.  ? ?If large square, outer bandage is left in place, this can be removed after 24 hours from your procedure. Do not remove steri-strips or glue as below.  ? ?Monitor your defibrillator site for redness, swelling, and drainage. Call the device clinic at [phone] if you experience these symptoms or fever/chills. ? ?If your incision is sealed with Steri-strips or staples, you may shower 10 days after your procedure or when told by your provider. Do not remove the steri-strips or let the shower hit directly on your site. You may wash around your site with soap and water.   ? ?If you were discharged in a sling, please do not wear this during the day more than 48 hours after your surgery unless otherwise instructed. This may increase the risk of stiffness and soreness in your shoulder.  ? ?Avoid lotions, ointments, or perfumes over your incision until it is well-healed. ? ?You may use a hot tub or a pool AFTER your wound check appointment if the incision is completely closed. ? ?Your ICD is designed to protect you from life threatening heart  rhythms. Because of this, you may receive a shock.  ? ?1 shock with no symptoms:  Call the office during business hours. ?1 shock with symptoms (chest pain, chest pressure, dizziness, lightheadedness, shortness of breath, overall feeling unwell):  Call 911. ?If you experience 2 or more shocks in 24 hours:  Call 911. ?If you receive a shock, you should not drive for 6 months per the [place] DMV IF you receive appropriate therapy from your ICD.  ? ?ICD Alerts:  Some alerts are vibratory and others beep. These are NOT emergencies. Please call our office to let us know. If this occurs at night or on weekends, it can wait until the next business day. Send a remote transmission. ? ?If your device is capable of reading fluid status (for heart failure), you will be offered monthly monitoring to review this with you.  ? ?DEVICE MANAGEMENT ?Remote monitoring is used to monitor your ICD from home. This monitoring is scheduled every 91 days by our office. It allows [place] to keep an eye on the functioning of your device to ensure it is working properly. You will routinely see your Electrophysiologist annually (more often if necessary).  ? ?You should receive your ID card for your new device in 4-8 weeks. Keep this card with you at all times once received. Consider wearing a medical alert bracelet or necklace. ? ?Your ICD  may be MRI compatible. This will be discussed at your next  office visit/wound check.  You should avoid contact with strong electric or magnetic fields.  ? ?Do not use amateur (ham) radio equipment or electric (arc) welding torches. MP3 player headphones with magnets should not be used. Some devices are safe to use if held at least 12 inches (30 cm) from your defibrillator. These include power tools, lawn mowers, and speakers. If you are unsure if something is safe to use, ask your health care provider. ? ?When using your cell phone, hold it to the ear that is on the opposite side from the defibrillator. Do not leave  your cell phone in a pocket over the defibrillator. ? ?You may safely use electric blankets, heating pads, computers, and microwave ovens. ? ?Call the office right away if: ?You have chest pain. ?You feel more than one shock. ?You feel more short of breath than you have felt before. ?You feel more light-headed than you have felt before. ?Your incision starts to open up. ? ?This information is not intended to replace advice given to you by your health care provider. Make sure you discuss any questions you have with your health care provider. ? ? ?  ?

## 2021-03-30 LAB — BPAM RBC
Blood Product Expiration Date: 202303282359
Blood Product Expiration Date: 202304192359
ISSUE DATE / TIME: 202303220901
ISSUE DATE / TIME: 202303250801
Unit Type and Rh: 5100
Unit Type and Rh: 9500

## 2021-03-30 LAB — TYPE AND SCREEN
ABO/RH(D): O POS
Antibody Screen: NEGATIVE
Unit division: 0
Unit division: 0

## 2021-04-01 ENCOUNTER — Encounter (HOSPITAL_COMMUNITY): Payer: Self-pay | Admitting: Internal Medicine

## 2021-04-02 NOTE — Telephone Encounter (Deleted)
Spoke with the patient. He misunderstood my question. His household is a single earner home. He is the earner for the family of 2. Resubmitted this information. ?Reports he is feeling well on the prednisone. ?

## 2021-04-10 ENCOUNTER — Ambulatory Visit (INDEPENDENT_AMBULATORY_CARE_PROVIDER_SITE_OTHER): Payer: 59

## 2021-04-10 DIAGNOSIS — I428 Other cardiomyopathies: Secondary | ICD-10-CM

## 2021-04-10 LAB — CUP PACEART INCLINIC DEVICE CHECK
Battery Remaining Longevity: 108 mo
Battery Voltage: 3.11 V
Brady Statistic AP VP Percent: 5.04 %
Brady Statistic AP VS Percent: 0.28 %
Brady Statistic AS VP Percent: 93.09 %
Brady Statistic AS VS Percent: 1.59 %
Brady Statistic RA Percent Paced: 5.32 %
Brady Statistic RV Percent Paced: 0.65 %
Date Time Interrogation Session: 20230405120033
HighPow Impedance: 50 Ohm
Implantable Lead Implant Date: 20190311
Implantable Lead Implant Date: 20190311
Implantable Lead Implant Date: 20230323
Implantable Lead Location: 753858
Implantable Lead Location: 753859
Implantable Lead Location: 753860
Implantable Lead Model: 4398
Implantable Lead Model: 5076
Implantable Pulse Generator Implant Date: 20230323
Lead Channel Impedance Value: 174.595
Lead Channel Impedance Value: 198.205
Lead Channel Impedance Value: 206.17 Ohm
Lead Channel Impedance Value: 218.298
Lead Channel Impedance Value: 228 Ohm
Lead Channel Impedance Value: 323 Ohm
Lead Channel Impedance Value: 323 Ohm
Lead Channel Impedance Value: 380 Ohm
Lead Channel Impedance Value: 380 Ohm
Lead Channel Impedance Value: 399 Ohm
Lead Channel Impedance Value: 513 Ohm
Lead Channel Impedance Value: 570 Ohm
Lead Channel Impedance Value: 665 Ohm
Lead Channel Impedance Value: 722 Ohm
Lead Channel Impedance Value: 760 Ohm
Lead Channel Impedance Value: 817 Ohm
Lead Channel Impedance Value: 817 Ohm
Lead Channel Impedance Value: 836 Ohm
Lead Channel Pacing Threshold Amplitude: 0.375 V
Lead Channel Pacing Threshold Amplitude: 0.375 V
Lead Channel Pacing Threshold Amplitude: 0.625 V
Lead Channel Pacing Threshold Pulse Width: 0.4 ms
Lead Channel Pacing Threshold Pulse Width: 0.4 ms
Lead Channel Pacing Threshold Pulse Width: 0.4 ms
Lead Channel Sensing Intrinsic Amplitude: 1.375 mV
Lead Channel Sensing Intrinsic Amplitude: 1.5 mV
Lead Channel Sensing Intrinsic Amplitude: 10.25 mV
Lead Channel Sensing Intrinsic Amplitude: 10.875 mV
Lead Channel Setting Pacing Amplitude: 2 V
Lead Channel Setting Pacing Amplitude: 2 V
Lead Channel Setting Pacing Amplitude: 2.5 V
Lead Channel Setting Pacing Pulse Width: 0.4 ms
Lead Channel Setting Pacing Pulse Width: 0.4 ms
Lead Channel Setting Sensing Sensitivity: 0.3 mV

## 2021-04-10 NOTE — Progress Notes (Signed)

## 2021-04-10 NOTE — Patient Instructions (Signed)
? ?  After Your ICD ?(Implantable Cardiac Defibrillator) ? ? ? ?Monitor your defibrillator site for redness, swelling, and drainage. Call the device clinic at [phone] if you experience these symptoms or fever/chills. ? ?Your incision was closed with Steri-strips or staples:  You may shower 7 days after your procedure and wash your incision with soap and water. Avoid lotions, ointments, or perfumes over your incision until it is well-healed. ? ?You may use a hot tub or a pool after your wound check appointment if the incision is completely closed. ? ?Do not lift, push or pull greater than 10 pounds with the affected arm until 6 weeks after your procedure. There are no other restrictions in arm movement after your wound check appointment. ? ?Your ICD is designed to protect you from life threatening heart rhythms. Because of this, you may receive a shock.  ? ?1 shock with no symptoms:  Call the office during business hours. ?1 shock with symptoms (chest pain, chest pressure, dizziness, lightheadedness, shortness of breath, overall feeling unwell):  Call 911. ?If you experience 2 or more shocks in 24 hours:  Call 911. ?If you receive a shock, you should not drive.  ?[place] DMV - no driving for 6 months if you receive appropriate therapy from your ICD.  ? ?ICD Alerts:  Some alerts are vibratory and others beep. These are NOT emergencies. Please call our office to let us know. If this occurs at night or on weekends, it can wait until the next business day. Send a remote transmission. ? ?If your device is capable of reading fluid status (for heart failure), you will be offered monthly monitoring to review this with you.  ? ?Remote monitoring is used to monitor your ICD from home. This monitoring is scheduled every 91 days by our office. It allows [place] to keep an eye on the functioning of your device to ensure it is working properly. You will routinely see your Electrophysiologist annually (more often if necessary).  ?

## 2021-04-11 NOTE — Telephone Encounter (Signed)
Correction. Spoke with the patient. He is the main earner in the family. He will bring me the 1040 form of his tax return. ?

## 2021-04-14 ENCOUNTER — Encounter (HOSPITAL_COMMUNITY): Payer: Self-pay | Admitting: Internal Medicine

## 2021-05-03 ENCOUNTER — Other Ambulatory Visit: Payer: Self-pay | Admitting: Cardiology

## 2021-05-03 DIAGNOSIS — Z0279 Encounter for issue of other medical certificate: Secondary | ICD-10-CM

## 2021-06-19 ENCOUNTER — Telehealth: Payer: Self-pay | Admitting: Internal Medicine

## 2021-06-19 NOTE — Telephone Encounter (Signed)
Disability form completed by Dr. Lovena Le and faxed to [place].  Technical brewer faxed to [employer] in [place].

## 2021-06-19 NOTE — Telephone Encounter (Signed)
Paper work completed and returned to front desk.

## 2021-06-19 NOTE — Telephone Encounter (Signed)
Disability form was left in the HIM office by Medical Records representative by mistake.  It was found and given to me to document.  Payment received and form given to Dr. Fernanda Drum.

## 2021-06-29 ENCOUNTER — Encounter: Payer: Self-pay | Admitting: Cardiology

## 2021-07-01 ENCOUNTER — Ambulatory Visit (INDEPENDENT_AMBULATORY_CARE_PROVIDER_SITE_OTHER): Payer: 59 | Admitting: Internal Medicine

## 2021-07-01 ENCOUNTER — Encounter: Payer: Self-pay | Admitting: Internal Medicine

## 2021-07-01 VITALS — BP 104/56 | HR 97 | Ht 70.5 in | Wt 192.6 lb

## 2021-07-01 DIAGNOSIS — I428 Other cardiomyopathies: Secondary | ICD-10-CM

## 2021-07-01 DIAGNOSIS — I447 Left bundle-branch block, unspecified: Secondary | ICD-10-CM | POA: Diagnosis not present

## 2021-07-07 ENCOUNTER — Other Ambulatory Visit: Payer: Self-pay | Admitting: Cardiology

## 2021-07-18 ENCOUNTER — Encounter: Payer: Self-pay | Admitting: Cardiology

## 2021-07-20 ENCOUNTER — Other Ambulatory Visit: Payer: Self-pay | Admitting: Cardiology

## 2021-07-21 IMAGING — CT CT ABD-PELV W/ CM
1 of 3 series · 13 of 32 positions shown, 18 images · IV contrast (APPLIED)
Comparison: None

CLINICAL DATA: 53-year-old male with abdominal pain and burning.

EXAM:
CT ABDOMEN AND PELVIS WITH CONTRAST
TECHNIQUE: Multidetector CT imaging of the abdomen and pelvis was performed
using the standard protocol following bolus administration of
intravenous contrast.
CONTRAST:  100mL [id number] IOPAMIDOL ([id number]) INJECTION 61%

[Series 2: abd/pelvis w/cm · axial · 0.78mm/px · z∈[-472,-37]mm · 13 of 99 slices shown, 18 images]
[im 6/99  soft-tissue]
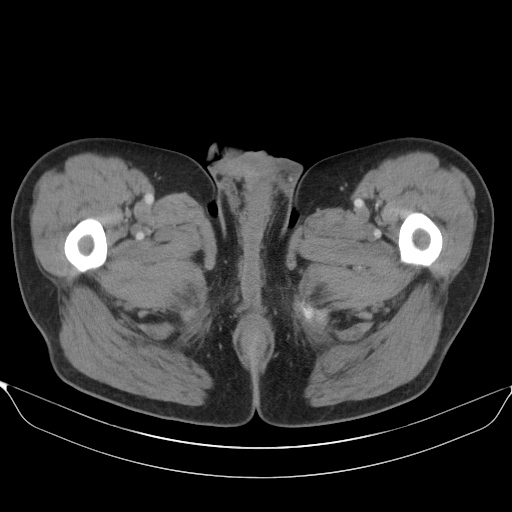
[im 6/99  bone]
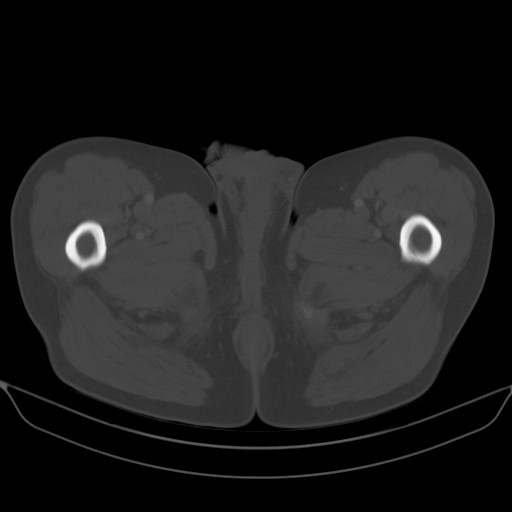
[im 18/99  soft-tissue]
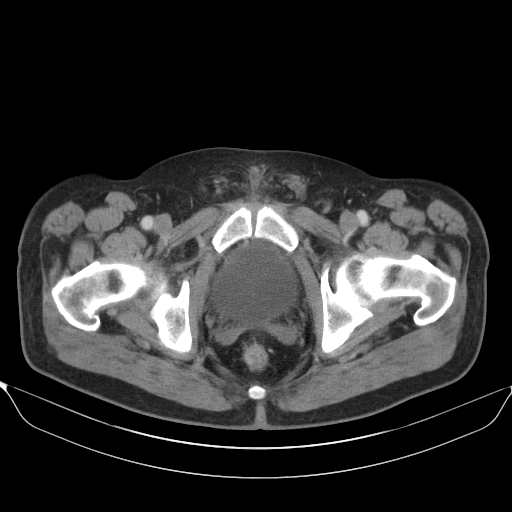
[im 24/99  soft-tissue]
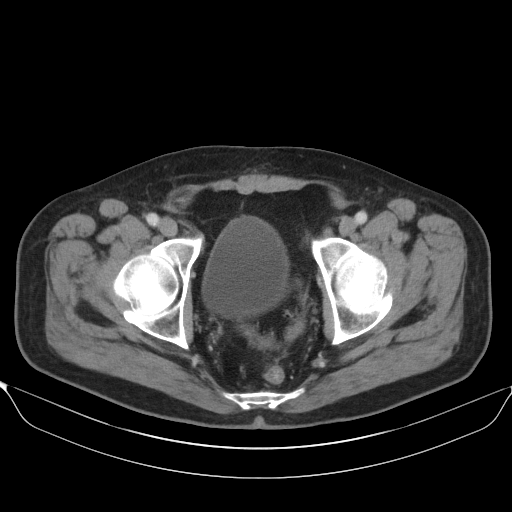
[im 29/99  soft-tissue]
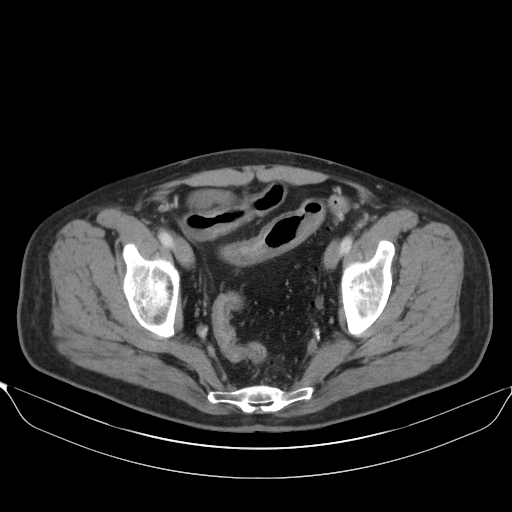
[im 41/99  soft-tissue]
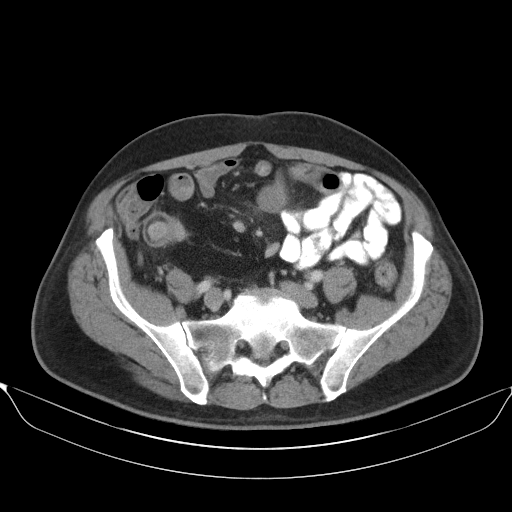
[im 47/99  soft-tissue]
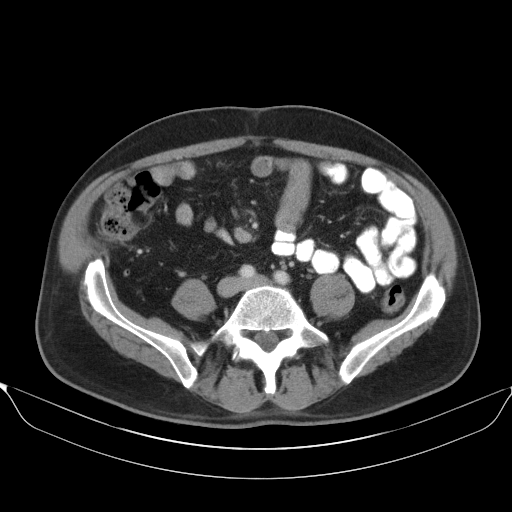
[im 52/99  soft-tissue]
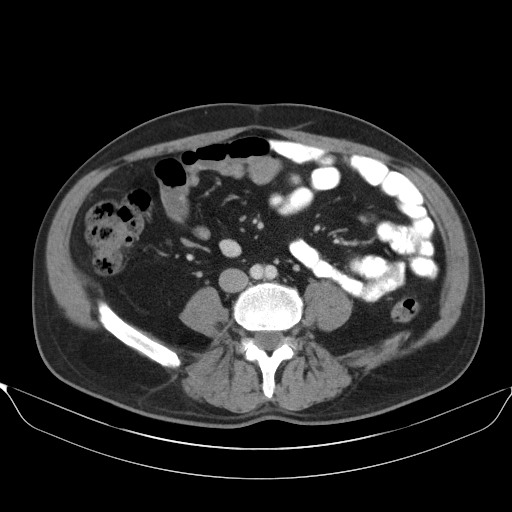
[im 64/99  soft-tissue]
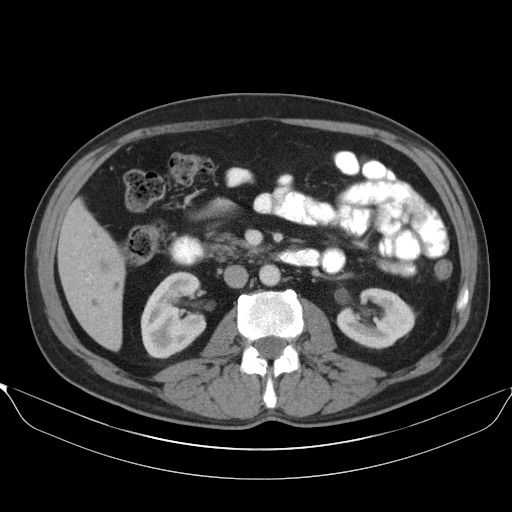
[im 70/99  soft-tissue]
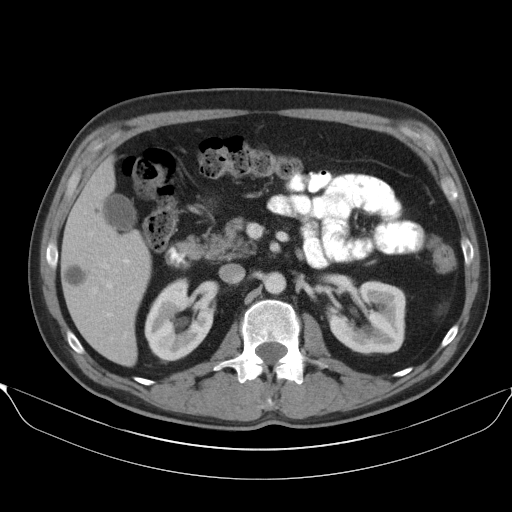
[im 70/99  bone]
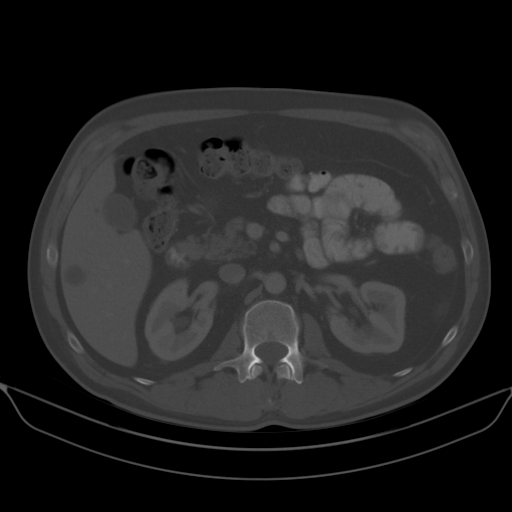
[im 75/99  soft-tissue]
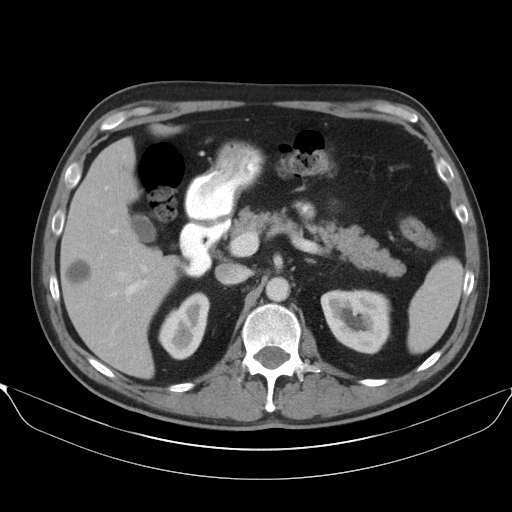
[im 75/99  lung]
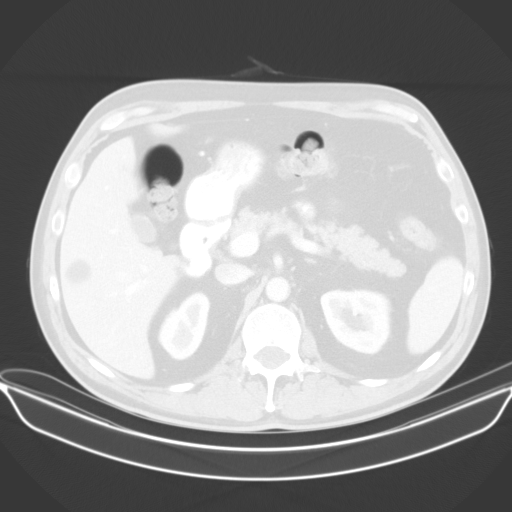
[im 81/99  lung]
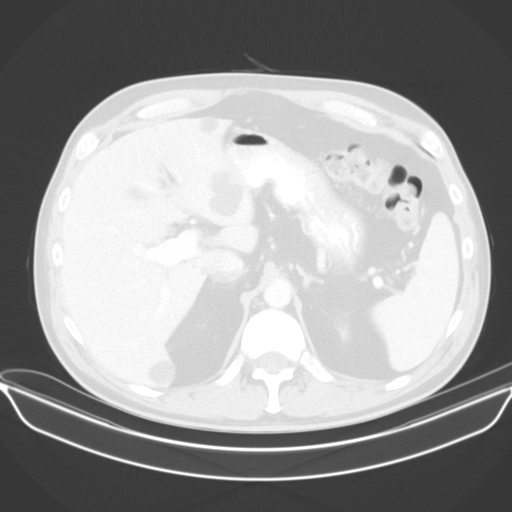
[im 87/99  soft-tissue]
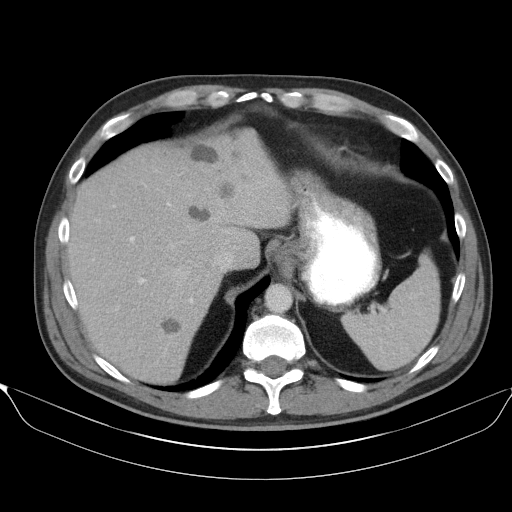
[im 87/99  lung]
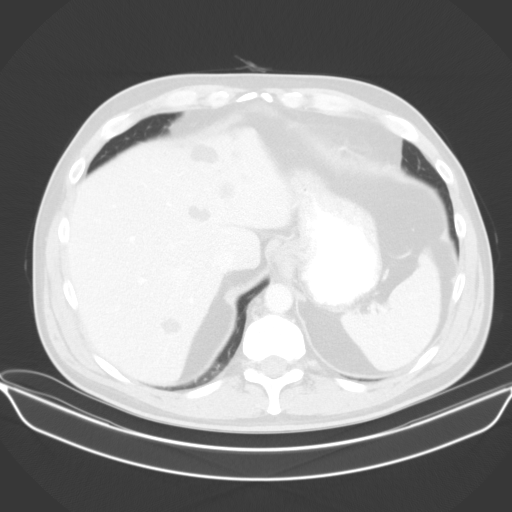
[im 93/99  soft-tissue]
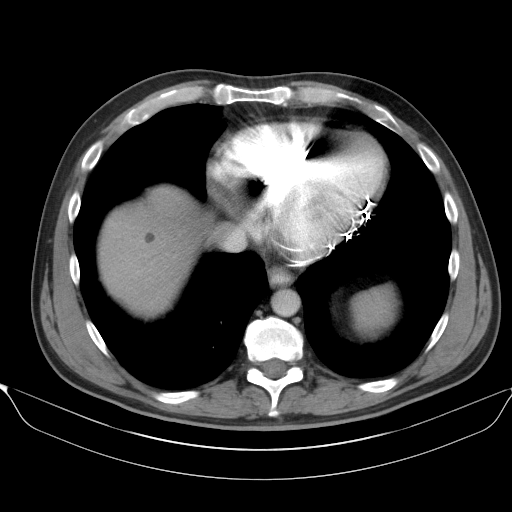
[im 93/99  lung]
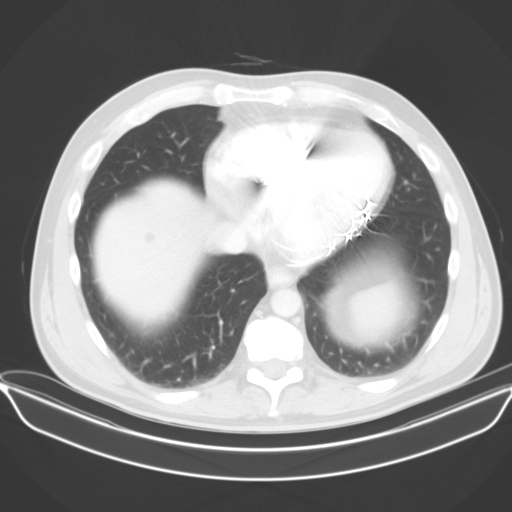

[13 of 32 positions shown; findings below may reference images not displayed]

FINDINGS: Lower chest: The visualized lung bases are clear. Cardiac pacemaker
wires noted.

No intra-abdominal free air or free fluid.

Hepatobiliary: Multiple (greater than 20) hepatic hypodense lesions
measuring up to 4 cm in the left lobe of the liver. The larger
lesions demonstrate fluid attenuation most consistent with cysts and
smaller lesions are too small to characterize. No intrahepatic
biliary ductal dilatation. There is a small stone in the
gallbladder. No pericholecystic fluid or evidence of acute
cholecystitis.

Pancreas: Unremarkable. No pancreatic ductal dilatation or
surrounding inflammatory changes.

Spleen: Normal in size without focal abnormality.

Adrenals/Urinary Tract: Mild right adrenal thickening. The left
adrenal gland is unremarkable. There is no hydronephrosis on either
side. There is symmetric enhancement and excretion of contrast by
both kidneys. The visualized ureters and urinary bladder appear
unremarkable.

Stomach/Bowel: There is sigmoid diverticulosis without active
inflammatory changes. There is diffuse inflammatory changes and
thickening of the distal and terminal ileum which may represent an
infectious enteritis or an inflammatory bowel disease. Correlation
with history of Crohn's recommended. There is no bowel obstruction.
The appendix is normal.

Vascular/Lymphatic: The abdominal aorta and IVC are unremarkable. No
portal venous gas. Multiple mildly enlarged lower mesenteric lymph
nodes, reactive.

Reproductive: The prostate and seminal vesicles are grossly
unremarkable. No pelvic mass.

Other: Small fat containing umbilical hernia.

Musculoskeletal: No acute or significant osseous findings.
IMPRESSION: 1. Inflammatory changes and thickening of the distal and terminal
ileum which may represent an infectious enteritis or an inflammatory
bowel disease. Correlation with history of Crohn's recommended. No
bowel obstruction. Normal appendix.
2. Sigmoid diverticulosis.
3. Cholelithiasis.

## 2021-07-27 ENCOUNTER — Encounter: Payer: Self-pay | Admitting: Cardiology

## 2021-09-13 ENCOUNTER — Telehealth: Payer: Self-pay | Admitting: Pharmacy Technician

## 2021-09-13 ENCOUNTER — Other Ambulatory Visit (HOSPITAL_COMMUNITY): Payer: Self-pay

## 2021-09-13 NOTE — Telephone Encounter (Signed)
Patient Advocate Encounter  Received notification that prior authorization for HUMIRA is required.   PA submitted on 9.8.23 Key [id number] Status is pending    Luciano Cutter, CPhT Patient Advocate Phone: [phone]

## 2021-09-20 ENCOUNTER — Other Ambulatory Visit (HOSPITAL_COMMUNITY): Payer: Self-pay

## 2021-09-20 NOTE — Telephone Encounter (Signed)
Ok thank you 

## 2021-09-20 NOTE — Telephone Encounter (Signed)
I am reaching out to him to get him in here for an office visit. Please review his recent medical history. The Humira needs refills. Thank you

## 2021-09-20 NOTE — Telephone Encounter (Signed)
Patient Advocate Encounter  Prior Authorization for Humira Pen (CF) 40MG/0.4ML pen-injector kit has been approved.    Effective: 09-20-2021 to 09-14-2022  Pt must fill at [facility]

## 2021-09-24 ENCOUNTER — Other Ambulatory Visit: Payer: Self-pay

## 2021-09-24 MED ORDER — HUMIRA (2 PEN) 40 MG/0.4ML ~~LOC~~ AJKT
40.0000 mg | AUTO-INJECTOR | SUBCUTANEOUS | 3 refills | Status: DC
Start: 2021-09-24 — End: 2022-01-20

## 2021-09-24 NOTE — Telephone Encounter (Signed)
He is due TB screening.  What other labs do you want. His follow up appointment is 11/19/21.

## 2021-09-25 ENCOUNTER — Other Ambulatory Visit: Payer: Self-pay

## 2021-09-25 DIAGNOSIS — K5000 Crohn's disease of small intestine without complications: Secondary | ICD-10-CM

## 2021-09-25 NOTE — Telephone Encounter (Signed)
Please request CBC, CMP, TB quantiferon, iron panel, CRP, B12 and Vit D. Thanks

## 2021-10-07 ENCOUNTER — Other Ambulatory Visit (INDEPENDENT_AMBULATORY_CARE_PROVIDER_SITE_OTHER): Payer: 59

## 2021-10-07 ENCOUNTER — Ambulatory Visit: Payer: 59

## 2021-10-07 DIAGNOSIS — K5000 Crohn's disease of small intestine without complications: Secondary | ICD-10-CM | POA: Diagnosis not present

## 2021-10-07 DIAGNOSIS — I340 Nonrheumatic mitral (valve) insufficiency: Secondary | ICD-10-CM

## 2021-10-07 LAB — CBC WITH DIFFERENTIAL/PLATELET
Basophils Absolute: 0.1 10*3/uL (ref 0.0–0.1)
Basophils Relative: 0.7 % (ref 0.0–3.0)
Eosinophils Absolute: 0 10*3/uL (ref 0.0–0.7)
Eosinophils Relative: 0.5 % (ref 0.0–5.0)
HCT: 30.3 % — ABNORMAL LOW (ref 39.0–52.0)
Hemoglobin: 10.2 g/dL — ABNORMAL LOW (ref 13.0–17.0)
Lymphocytes Relative: 14 % (ref 12.0–46.0)
Lymphs Abs: 1.3 10*3/uL (ref 0.7–4.0)
MCHC: 33.7 g/dL (ref 30.0–36.0)
MCV: 84 fl (ref 78.0–100.0)
Monocytes Absolute: 0.6 10*3/uL (ref 0.1–1.0)
Monocytes Relative: 6.1 % (ref 3.0–12.0)
Neutro Abs: 7.2 10*3/uL (ref 1.4–7.7)
Neutrophils Relative %: 78.7 % — ABNORMAL HIGH (ref 43.0–77.0)
Platelets: 208 10*3/uL (ref 150.0–400.0)
RBC: 3.6 Mil/uL — ABNORMAL LOW (ref 4.22–5.81)
RDW: 16.7 % — ABNORMAL HIGH (ref 11.5–15.5)
WBC: 9.2 10*3/uL (ref 4.0–10.5)

## 2021-10-07 LAB — COMPREHENSIVE METABOLIC PANEL
ALT: 11 U/L (ref 0–53)
AST: 14 U/L (ref 0–37)
Albumin: 3.7 g/dL (ref 3.5–5.2)
Alkaline Phosphatase: 67 U/L (ref 39–117)
BUN: 15 mg/dL (ref 6–23)
CO2: 25 mEq/L (ref 19–32)
Calcium: 9.5 mg/dL (ref 8.4–10.5)
Chloride: 106 mEq/L (ref 96–112)
Creatinine, Ser: 1.12 mg/dL (ref 0.40–1.50)
GFR: 72.89 mL/min (ref >60.00)
Glucose, Bld: 124 mg/dL — ABNORMAL HIGH (ref 70–99)
Potassium: 4.6 mEq/L (ref 3.5–5.1)
Sodium: 140 mEq/L (ref 135–145)
Total Bilirubin: 1 mg/dL (ref 0.2–1.2)
Total Protein: 6.7 g/dL (ref 6.0–8.3)

## 2021-10-07 LAB — VITAMIN B12: Vitamin B-12: 865 pg/mL (ref 211–911)

## 2021-10-07 LAB — IBC + FERRITIN
Ferritin: 60.4 ng/mL (ref 22.0–322.0)
Iron: 15 ug/dL — ABNORMAL LOW (ref 42–165)
Saturation Ratios: 4.9 % — ABNORMAL LOW (ref 20.0–50.0)
TIBC: 305.2 ug/dL (ref 250.0–450.0)
Transferrin: 218 mg/dL (ref 212.0–360.0)

## 2021-10-07 LAB — VITAMIN D 25 HYDROXY (VIT D DEFICIENCY, FRACTURES): VITD: 37.3 ng/mL (ref 30.00–100.00)

## 2021-10-07 LAB — HIGH SENSITIVITY CRP: CRP, High Sensitivity: 98.31 mg/L — ABNORMAL HIGH (ref 0.000–5.000)

## 2021-10-11 LAB — QUANTIFERON-TB GOLD PLUS
Mitogen-NIL: >10 IU/mL
NIL: 0.19 IU/mL
QuantiFERON-TB Gold Plus: NEGATIVE
TB1-NIL: 0 IU/mL
TB2-NIL: <0 IU/mL

## 2021-11-11 ENCOUNTER — Encounter: Payer: Self-pay | Admitting: Cardiology

## 2021-11-11 ENCOUNTER — Ambulatory Visit: Payer: 59 | Admitting: Cardiology

## 2021-11-11 VITALS — BP 119/62 | HR 64 | Resp 17 | Ht 70.0 in | Wt 189.0 lb

## 2021-11-11 DIAGNOSIS — I2510 Atherosclerotic heart disease of native coronary artery without angina pectoris: Secondary | ICD-10-CM

## 2021-11-11 DIAGNOSIS — I5020 Unspecified systolic (congestive) heart failure: Secondary | ICD-10-CM

## 2021-11-11 DIAGNOSIS — I428 Other cardiomyopathies: Secondary | ICD-10-CM

## 2021-11-11 MED ORDER — FUROSEMIDE 40 MG PO TABS: 40.0000 mg | ORAL_TABLET | ORAL | 3 refills | Status: DC

## 2021-11-11 MED ORDER — BISOPROLOL FUMARATE 5 MG PO TABS: 5.0000 mg | ORAL_TABLET | Freq: Every day | ORAL | 3 refills | Status: DC

## 2021-11-11 MED ORDER — SPIRONOLACTONE 25 MG PO TABS: 25.0000 mg | ORAL_TABLET | Freq: Every day | ORAL | 1 refills | Status: DC

## 2021-11-11 MED ORDER — ENTRESTO 97-103 MG PO TABS: 1.0000 | ORAL_TABLET | Freq: Two times a day (BID) | ORAL | 3 refills | Status: DC

## 2021-11-11 NOTE — Progress Notes (Signed)
Follow up visit  Subjective:   Miguel Pena, male    DOB: 11-Jul-1965, 56 y.o.   MRN: [id number]   HPI   Chief Complaint  Patient presents with   Cardiomyopathy   56 y/o Caucasian male with nonischemic dilated cardiomyopathy with now mormalization of LVEF post CRT-D placement 03/2017 and s/p lead revision 03/2021, single vessel CAD s/p successful PCI to OM1 with 3.5 X 16 mm DES 10/2016, hyperlipidemia, prior smoking history, testosterone deficiency, Chron's disease  Patient is doing well, denies any chest pain, shortness of breath, edema symptoms. He is compliant with his medical therapy.    Current Outpatient Medications:    bisoprolol (ZEBETA) 5 MG tablet, TAKE 1 TABLET BY MOUTH EVERY DAY, Disp: 90 tablet, Rfl: 3   dicyclomine (BENTYL) 10 MG capsule, TAKE ONE CAPSULE BY MOUTH THREE TIMES DAILY(NOON, 4:00 PM AND EVENING) (Patient taking differently: Take 10 mg by mouth in the morning and at bedtime.), Disp: 90 capsule, Rfl: 3   ENTRESTO 97-103 MG, TAKE 1 TABLET BY MOUTH TWICE DAILY, Disp: 180 tablet, Rfl: 3   furosemide (LASIX) 40 MG tablet, Take 1 tablet (40 mg total) by mouth every other day. (Patient taking differently: Take 40 mg by mouth daily as needed (fluid retention.).), Disp: 45 tablet, Rfl: 3   Homeopathic Products (ZICAM ALLERGY RELIEF NA), Place 1 spray into the nose daily as needed (allergies)., Disp: , Rfl:    HUMIRA PEN 40 MG/0.4ML PNKT, Inject 40 mg into the skin every 14 (fourteen) days. Dx K50.919, Disp: 2 each, Rfl: 3   HYDROcodone-acetaminophen (NORCO) 10-325 MG tablet, Take 1 tablet by mouth 6 (six) times daily., Disp: , Rfl:    Multiple Vitamin (MULTIVITAMIN WITH MINERALS) TABS tablet, Take 1 tablet by mouth in the morning., Disp: , Rfl:    Multiple Vitamins-Minerals (HAIR/SKIN/NAILS) CAPS, Take 2 capsules by mouth daily., Disp: , Rfl:    nicotine polacrilex (NICORETTE) 4 MG gum, Take 4 mg by mouth as needed for smoking cessation., Disp: , Rfl:    omega-3 acid  ethyl esters (LOVAZA) 1 g capsule, Take 1 g by mouth 2 (two) times daily., Disp: , Rfl:    pantoprazole (PROTONIX) 40 MG tablet, Take 1 tablet (40 mg total) by mouth daily. Before breakfast, Disp: 90 tablet, Rfl: 3   potassium chloride SA (KLOR-CON M) 20 MEQ tablet, TAKE 1 TABLET BY MOUTH DAILY, Disp: 90 tablet, Rfl: 3   rosuvastatin (CRESTOR) 40 MG tablet, TAKE 1 TABLET(40 MG) BY MOUTH DAILY (Patient taking differently: Take 40 mg by mouth every evening.), Disp: 90 tablet, Rfl: 3   spironolactone (ALDACTONE) 25 MG tablet, TAKE 1 TABLET BY MOUTH EVERY DAY, Disp: 90 tablet, Rfl: 1  Cardiovascular & other pertient studies:   EKG 11/11/2021: Sinus rhythm w/ventricular pacing  Echocardiogram 10/07/2021: Normal LV systolic function with visual EF 60-65%. Left ventricle cavity is normal in size. Normal left ventricular wall thickness. Normal global wall motion. Abnormal septal wall motion due to ventricle pacemaker. Normal diastolic filling pattern and normal LAP (accuracy limited due to MAC and pacemaker). Mild calcification of the mitral valve annulus. No evidence of mitral stenosis.   Mild to moderate mitral regurgitation. Mild tricuspid regurgitation. No evidence of pulmonary hypertension. Compared to 11/15/2019 LVEF improved from 50-55% to 60-65%, MR remains stable.  Remote CRT-D transmission 10/27/2021: AP 3%, CRT 96%.  Longevity 9 years and 08 months.  Lead impedance and thresholds initializing and stable.  There were 12 ventricular monitoring episodes = PVC, 2  SVT episodes,longest 2 min.  Thoracic impedance does not suggest volume overload.  Recent labs: 10/07/2021: Glucose 124, BUN/Cr 15/1.12. EGFR 72. Na/K 140/4.6. Rest of the CMP normal H/H 10/30. MCV 84. Platelets 208 CRP 98 elevated  09/12/2020: Glucose 120, BUN/Cr 13/0.81. EGFR 98. Na/K 138/5.0. Rest of the CMP normal H/H 12/27. MCV 91. Platelets 218  11/2019: Chol 95, TG 161, HDL 36, LDL 31   Review of Systems   Cardiovascular:  Negative for chest pain, dyspnea on exertion, leg swelling, palpitations and syncope.         Vitals:   11/11/21 0832  BP: 119/62  Pulse: 64  Resp: 17  SpO2: 98%     Body mass index is 27.12 kg/m. Filed Weights   11/11/21 0832  Weight: 189 lb (85.7 kg)     Objective:   Physical Exam Vitals and nursing note reviewed.  Constitutional:      General: He is not in acute distress. Neck:     Vascular: No JVD.  Cardiovascular:     Rate and Rhythm: Normal rate and regular rhythm.     Heart sounds: Normal heart sounds. No murmur heard. Pulmonary:     Effort: Pulmonary effort is normal.     Breath sounds: Normal breath sounds. No wheezing or rales.  Musculoskeletal:     Right lower leg: No edema.     Left lower leg: No edema.           Assessment & Recommendations:   56 y/o Caucasian male with nonischemic dilated cardiomyopathy with now mormalization of LVEF post CRT-D placement 03/2017 and s/p lead revision 03/2021, single vessel CAD s/p successful PCI to OM1 with 3.5 X 16 mm DES 10/2016, hyperlipidemia, prior smoking history, testosterone deficiency, Chron's disease  Nonischemic cardiomyopathy: S/p lead revision 03/2021. Clinically compensated. No CHF on ICD monitoring.  Continue bisoprolol to 5 mg daily, Entresto 97-103 mg bid, Spironolactone 25 mg daily. Resting HR <70 bpm. Corlanor not indicated.  EF normalized to 55% on above medical therapy (Echo 09/2017).   Mitral regurgitation: Mild to moderate, stable, asymptomatic. Recommend only clinical follow up.  CAD: Stable with no angina symnptoms. Conitnue Aspirin, statin.  F/u in 1 year    [name], MD Pager: [phone] Office: [phone]

## 2021-11-12 LAB — LIPID PANEL
Chol/HDL Ratio: 2.3 ratio (ref 0.0–5.0)
Cholesterol, Total: 88 mg/dL — ABNORMAL LOW (ref 100–199)
HDL: 38 mg/dL — ABNORMAL LOW (ref >39)
LDL Chol Calc (NIH): 29 mg/dL (ref 0–99)
Triglycerides: 117 mg/dL (ref 0–149)
VLDL Cholesterol Cal: 21 mg/dL (ref 5–40)

## 2021-11-19 ENCOUNTER — Other Ambulatory Visit (INDEPENDENT_AMBULATORY_CARE_PROVIDER_SITE_OTHER): Payer: 59

## 2021-11-19 ENCOUNTER — Encounter: Payer: Self-pay | Admitting: Gastroenterology

## 2021-11-19 ENCOUNTER — Ambulatory Visit (INDEPENDENT_AMBULATORY_CARE_PROVIDER_SITE_OTHER): Payer: 59 | Admitting: Gastroenterology

## 2021-11-19 VITALS — BP 112/76 | HR 66 | Ht 70.0 in | Wt 189.0 lb

## 2021-11-19 DIAGNOSIS — K219 Gastro-esophageal reflux disease without esophagitis: Secondary | ICD-10-CM

## 2021-11-19 DIAGNOSIS — K5000 Crohn's disease of small intestine without complications: Secondary | ICD-10-CM

## 2021-11-19 DIAGNOSIS — D509 Iron deficiency anemia, unspecified: Secondary | ICD-10-CM

## 2021-11-19 LAB — HEMOGLOBIN: Hemoglobin: 10.2 g/dL — ABNORMAL LOW (ref 13.0–17.0)

## 2021-11-19 LAB — HEMATOCRIT: HCT: 30.6 % — ABNORMAL LOW (ref 39.0–52.0)

## 2021-11-19 LAB — C-REACTIVE PROTEIN: CRP: 1.1 mg/dL (ref 0.5–20.0)

## 2021-11-19 MED ORDER — PANTOPRAZOLE SODIUM 40 MG PO TBEC: 40.0000 mg | DELAYED_RELEASE_TABLET | Freq: Every day | ORAL | 3 refills | Status: DC

## 2021-11-19 NOTE — Progress Notes (Signed)
Miguel Pena    [id number]    11/19/1965  Primary Care Physician:Elkins, Curt Jews, MD  Referring Physician: [name], MD [address] [place],  [place] [address]   Chief complaint: Crohn's disease  HPI:  56 year old very pleasant gentleman with history of CHF, CAD on Plavix, follow-up visit for Crohn's disease.   Denies any diarrhea, constipation, melena or blood in stool   Overall he feels much better on maintenance dose of Humira, he completed the induction dose in September 2022   He is s/p CRT-D March 2019.     Relevant GI history: EGD December 06, 2018: LA grade C esophagitis and gastritis.  H. pylori negative   Colonoscopy December 06, 2018: Terminal ileum ulceration with ileitis, biopsies consistent with IBD.  Diverticulosis and internal hemorrhoids otherwise normal exam.   Capsule endoscopy October 22, 2018: Multiple aphthous ulcers and inflammation throughout small bowel, predominantly in the distal small bowel consistent with Crohn's ileitis     03/10/2017 EGD : LA grade C reflux and candidiasis esophagitis, benign-appearing esophageal stenosis, gastritis with hemorrhage, duodenal erosions without bleeding otherwise normal.  Started on Nexium 40 twice daily.      03/10/2017  Colonoscopy : moderate diverticulosis in the sigmoid and descending colon, narrowing of the colon in association with diverticular opening, peridiverticular erythema, 1 5 mm polyp in the sigmoid colon and nonbleeding internal hemorrhoids.   Pathology showed chronic gastritis and reflux esophagitis negative for H. pylori or dysplasia.  Polyp removed was hyperplastic.  Recall colonoscopy recommended in 10 years.      CT abdomen pelvis with contrast 09/29/2018 with inflammatory changes and thickening of the distal and terminal ileum which may represent infectious enteritis or an inflammatory bowel disease.  No bowel obstruction, normal appendix.  Sigmoid diverticulosis  and cholelithiasis.   Outpatient Encounter Medications as of 11/19/2021  Medication Sig   bisoprolol (ZEBETA) 5 MG tablet Take 1 tablet (5 mg total) by mouth daily.   dicyclomine (BENTYL) 10 MG capsule TAKE ONE CAPSULE BY MOUTH THREE TIMES DAILY(NOON, 4:00 PM AND EVENING) (Patient taking differently: Take 10 mg by mouth in the morning and at bedtime.)   furosemide (LASIX) 40 MG tablet Take 1 tablet (40 mg total) by mouth every other day.   Homeopathic Products (ZICAM ALLERGY RELIEF NA) Place 1 spray into the nose daily as needed (allergies).   HUMIRA PEN 40 MG/0.4ML PNKT Inject 40 mg into the skin every 14 (fourteen) days. Dx K50.919   HYDROcodone-acetaminophen (NORCO) 10-325 MG tablet Take 1 tablet by mouth 6 (six) times daily.   Multiple Vitamin (MULTIVITAMIN WITH MINERALS) TABS tablet Take 1 tablet by mouth in the morning.   Multiple Vitamins-Minerals (HAIR/SKIN/NAILS) CAPS Take 2 capsules by mouth daily.   nicotine polacrilex (NICORETTE) 4 MG gum Take 4 mg by mouth as needed for smoking cessation.   omega-3 acid ethyl esters (LOVAZA) 1 g capsule Take 1 g by mouth 2 (two) times daily.   pantoprazole (PROTONIX) 40 MG tablet Take 1 tablet (40 mg total) by mouth daily. Before breakfast   rosuvastatin (CRESTOR) 40 MG tablet TAKE 1 TABLET(40 MG) BY MOUTH DAILY (Patient taking differently: Take 40 mg by mouth every evening.)   sacubitril-valsartan (ENTRESTO) 97-103 MG Take 1 tablet by mouth 2 (two) times daily.   spironolactone (ALDACTONE) 25 MG tablet Take 1 tablet (25 mg total) by mouth daily.   No facility-administered encounter medications on file as of 11/19/2021.  Allergies as of 11/19/2021   (No Known Allergies)    Past Medical History:  Diagnosis Date   Bursitis of right shoulder    CHF (congestive heart failure) (HCC)    [name] 10/24/2016   Chronic combined systolic and diastolic heart failure ([place]) 03/16/2017   Coronary artery disease    Dilated cardiomyopathy ([place])     [name] 10/24/2016   Dysrhythmia 1995   "VT" per patient- states had neg stress test and no problems since   Encounter for adjustment or management of automatic implantable cardioverter-defibrillator 06/17/2018   Encounter for assessment of automatic implantable cardioverter-defibrillator (AICD) 06/17/2018   Heart murmur    "when I was young"   History of kidney stones    Hypercholesterolemia    ICD: Medtronic MRI Quad CRTD (Bi-V ICD) implantation 03/16/2017 [name] 03/16/2017   Scheduled Remote ICD check   8.7.20: No VHR episodes. No mode switches. Normal health trends. Trans-thoracic impedance trends and the OptiVol Fluid Index do no present significant abnormalities. Battery longevity is 6.5-8.3 years. RA pacing is 8.3 %, RV pacing is 99.9 %, and LV pacing is 99.9 %.  Clinic: 05/18/17   NICM (nonischemic cardiomyopathy) ([place]) 03/16/2017    Past Surgical History:  Procedure Laterality Date   BIOPSY  12/06/2018   Procedure: BIOPSY;  Surgeon: Mauri Pole, MD;  Location: [facility];  Service: Endoscopy;;  Egd and Colon   BIV ICD INSERTION CRT-D N/A 03/16/2017   Procedure: BIV ICD INSERTION CRT-D;  Surgeon: Deboraha Sprang, MD;  Location: [facility];  Service: Cardiovascular;  Laterality: N/A;   BUBBLE STUDY  11/30/2019   Procedure: BUBBLE STUDY;  Surgeon: Rex Kras, DO;  Location: [facility];  Service: Cardiovascular;;   COLONOSCOPY WITH PROPOFOL N/A 03/10/2017   Procedure: COLONOSCOPY WITH PROPOFOL;  Surgeon: Mauri Pole, MD;  Location: [facility];  Service: Endoscopy;  Laterality: N/A;   COLONOSCOPY WITH PROPOFOL N/A 12/06/2018   Procedure: COLONOSCOPY WITH PROPOFOL;  Surgeon: Mauri Pole, MD;  Location: [facility];  Service: Endoscopy;  Laterality: N/A;   CORONARY ANGIOPLASTY WITH STENT PLACEMENT  10/28/2016   CORONARY STENT INTERVENTION N/A 10/28/2016   Procedure: CORONARY STENT INTERVENTION;  Surgeon: [name], MD;  Location: [facility];  Service: Cardiovascular;  Laterality: N/A;   ESOPHAGOGASTRODUODENOSCOPY (EGD) WITH PROPOFOL N/A 03/10/2017   Procedure: ESOPHAGOGASTRODUODENOSCOPY (EGD) WITH PROPOFOL;  Surgeon: Mauri Pole, MD;  Location: [facility];  Service: Endoscopy;  Laterality: N/A;   ESOPHAGOGASTRODUODENOSCOPY (EGD) WITH PROPOFOL N/A 12/06/2018   Procedure: ESOPHAGOGASTRODUODENOSCOPY (EGD) WITH PROPOFOL;  Surgeon: Mauri Pole, MD;  Location: [facility];  Service: Endoscopy;  Laterality: N/A;   IMPLANTABLE CARDIOVERTER DEFIBRILLATOR (ICD) GENERATOR CHANGE N/A 03/28/2021   Procedure: ICD GENERATOR CHANGE LV LEAD EXTRACTION AND LV LEAD REPLACEMENT;  Surgeon: Evans Lance, MD;  Location: [facility];  Service: Cardiovascular;  Laterality: N/A;   INGUINAL HERNIA REPAIR Right 02/16/2013   Procedure: RIGHT INGUINAL HERNIA REPAIR ;  Surgeon: [name], MD;  Location: [facility];  Service: General;  Laterality: Right;  With MESH   INGUINAL HERNIA REPAIR Right 02/16/2013   INTRAVASCULAR PRESSURE WIRE/FFR STUDY N/A 10/28/2016   Procedure: INTRAVASCULAR PRESSURE WIRE/FFR STUDY;  Surgeon: [name], MD;  Location: [facility];  Service: Cardiovascular;  Laterality: N/A;   RIGHT/LEFT HEART CATH AND CORONARY ANGIOGRAPHY N/A 10/28/2016   Procedure: RIGHT/LEFT HEART CATH AND CORONARY ANGIOGRAPHY;  Surgeon: [name], MD;  Location: [facility];  Service: Cardiovascular;  Laterality: N/A;   TEE WITHOUT CARDIOVERSION N/A 11/30/2019   Procedure: TRANSESOPHAGEAL ECHOCARDIOGRAM (TEE);  Surgeon: Rex Kras, DO;  Location: [facility];  Service: Cardiovascular;  Laterality: N/A;   ULTRASOUND GUIDANCE FOR VASCULAR ACCESS  10/28/2016   Procedure: Ultrasound Guidance For Vascular Access;  Surgeon: [name], MD;  Location: [facility];  Service: Cardiovascular;;    Family History  Problem Relation Age of Onset   Heart attack Father    Colon polyps Father     Colon polyps Brother    Colon cancer Neg Hx    Stomach cancer Neg Hx    Pancreatic cancer Neg Hx    Esophageal cancer Neg Hx    Liver disease Neg Hx     Social History   Socioeconomic History   Marital status: Married    Spouse name: Not on file   Number of children: 2   Years of education: Not on file   Highest education level: Not on file  Occupational History   Occupation: Emergency planning/management officer    Comment: [employer]  Tobacco Use   Smoking status: Former    Packs/day: 1.00    Years: 12.00    Total pack years: 12.00    Types: Cigarettes, Cigars    Quit date: 02/16/2011    Years since quitting: 10.7   Smokeless tobacco: Former    Types: Snuff   Tobacco comments:    10/28/2016 'chewed for a couple years after I quit smoking"  Vaping Use   Vaping Use: Never used  Substance and Sexual Activity   Alcohol use: No   Drug use: No   Sexual activity: Not Currently  Other Topics Concern   Not on file  Social History Narrative   Not on file   Social Determinants of Health   Financial Resource Strain: Not on file  Food Insecurity: Not on file  Transportation Needs: Not on file  Physical Activity: Not on file  Stress: Not on file  Social Connections: Not on file  Intimate Partner Violence: Not on file      Review of systems: All other review of systems negative except as mentioned in the HPI.   Physical Exam: Vitals:   11/19/21 1450  BP: 112/76  Pulse: 66   Body mass index is 27.12 kg/m. Gen:      No acute distress HEENT:  sclera anicteric Abd:      soft, non-tender; no palpable masses, no distension Ext:    No edema Neuro: alert and oriented x 3 Psych: normal mood and affect  Data Reviewed:  Reviewed labs, radiology imaging, old records and pertinent past GI work up   Assessment and Plan/Recommendations:  56 year old very pleasant gentleman with history of CAD, CHF s/p CRT-D, GERD and Crohn's disease   Crohn's disease: Overall symptoms have  improved, in clinical remission Continue Humira maintenance dose every 2 weeks Follow-up LFT and CRP   Abdominal discomfort: Use dicyclomine 10 mg every 8 hours as needed   GERD: Use pantoprazole 40 mg daily Continue antireflux measures   Return in 3 months   The patient was provided an opportunity to ask questions and all were answered. The patient agreed with the plan and demonstrated an understanding of the instructions.  Damaris Hippo , MD    CC: [name], *

## 2021-11-19 NOTE — Patient Instructions (Addendum)
Your provider has requested that you go to the basement level for lab work before leaving today. Press "B" on the elevator. The lab is located at the first door on the left as you exit the elevator.  We have sent the following medications to your pharmacy for you to pick up at your convenience: Pantoprazole   Follow up in 3 months  _______________________________________________________  If you are age 57 or older, your body mass index should be between 23-30. Your Body mass index is 27.12 kg/m. If this is out of the aforementioned range listed, please consider follow up with your Primary Care Provider.  If you are age 51 or younger, your body mass index should be between 19-25. Your Body mass index is 27.12 kg/m. If this is out of the aformentioned range listed, please consider follow up with your Primary Care Provider.   ________________________________________________________  The  GI providers would like to encourage you to use [facility] to communicate with providers for non-urgent requests or questions.  Due to long hold times on the telephone, sending your provider a message by [facility] may be a faster and more efficient way to get a response.  Please allow 48 business hours for a response.  Please remember that this is for non-urgent requests.  _______________________________________________________  Thank you for choosing me and [facility].

## 2021-11-20 ENCOUNTER — Other Ambulatory Visit: Payer: Self-pay

## 2021-11-20 DIAGNOSIS — K5000 Crohn's disease of small intestine without complications: Secondary | ICD-10-CM

## 2021-11-20 DIAGNOSIS — D509 Iron deficiency anemia, unspecified: Secondary | ICD-10-CM

## 2021-11-20 DIAGNOSIS — K219 Gastro-esophageal reflux disease without esophagitis: Secondary | ICD-10-CM

## 2021-11-20 NOTE — Progress Notes (Signed)
Patient will have repeat labs in Jan 2024 and follow up appt in Feb 2024.

## 2021-12-02 ENCOUNTER — Encounter: Payer: Self-pay | Admitting: Gastroenterology

## 2022-01-19 ENCOUNTER — Other Ambulatory Visit: Payer: Self-pay | Admitting: Gastroenterology

## 2022-01-20 NOTE — Telephone Encounter (Signed)
Needs labwork

## 2022-01-31 ENCOUNTER — Encounter: Payer: Self-pay | Admitting: Gastroenterology

## 2022-02-18 ENCOUNTER — Telehealth: Payer: Self-pay | Admitting: Gastroenterology

## 2022-02-18 ENCOUNTER — Other Ambulatory Visit: Payer: Self-pay

## 2022-02-18 ENCOUNTER — Other Ambulatory Visit (INDEPENDENT_AMBULATORY_CARE_PROVIDER_SITE_OTHER): Payer: 59

## 2022-02-18 DIAGNOSIS — K5000 Crohn's disease of small intestine without complications: Secondary | ICD-10-CM

## 2022-02-18 LAB — COMPREHENSIVE METABOLIC PANEL
ALT: 9 U/L (ref 0–53)
AST: 11 U/L (ref 0–37)
Albumin: 3.5 g/dL (ref 3.5–5.2)
Alkaline Phosphatase: 60 U/L (ref 39–117)
BUN: 9 mg/dL (ref 6–23)
CO2: 27 mEq/L (ref 19–32)
Calcium: 9.7 mg/dL (ref 8.4–10.5)
Chloride: 105 mEq/L (ref 96–112)
Creatinine, Ser: 0.79 mg/dL (ref 0.40–1.50)
GFR: 98.32 mL/min (ref >60.00)
Glucose, Bld: 111 mg/dL — ABNORMAL HIGH (ref 70–99)
Potassium: 3.8 mEq/L (ref 3.5–5.1)
Sodium: 139 mEq/L (ref 135–145)
Total Bilirubin: 0.7 mg/dL (ref 0.2–1.2)
Total Protein: 6.6 g/dL (ref 6.0–8.3)

## 2022-02-18 LAB — CBC WITH DIFFERENTIAL/PLATELET
Basophils Absolute: 0.1 10*3/uL (ref 0.0–0.1)
Basophils Relative: 0.8 % (ref 0.0–3.0)
Eosinophils Absolute: 0 10*3/uL (ref 0.0–0.7)
Eosinophils Relative: 0.4 % (ref 0.0–5.0)
HCT: 30.4 % — ABNORMAL LOW (ref 39.0–52.0)
Hemoglobin: 10.4 g/dL — ABNORMAL LOW (ref 13.0–17.0)
Lymphocytes Relative: 17.9 % (ref 12.0–46.0)
Lymphs Abs: 2 10*3/uL (ref 0.7–4.0)
MCHC: 34.1 g/dL (ref 30.0–36.0)
MCV: 83.5 fl (ref 78.0–100.0)
Monocytes Absolute: 0.5 10*3/uL (ref 0.1–1.0)
Monocytes Relative: 4.3 % (ref 3.0–12.0)
Neutro Abs: 8.3 10*3/uL — ABNORMAL HIGH (ref 1.4–7.7)
Neutrophils Relative %: 76.6 % (ref 43.0–77.0)
Platelets: 339 10*3/uL (ref 150.0–400.0)
RBC: 3.64 Mil/uL — ABNORMAL LOW (ref 4.22–5.81)
RDW: 15.5 % (ref 11.5–15.5)
WBC: 10.9 10*3/uL — ABNORMAL HIGH (ref 4.0–10.5)

## 2022-02-18 LAB — HIGH SENSITIVITY CRP: CRP, High Sensitivity: 13.16 mg/L — ABNORMAL HIGH (ref 0.000–5.000)

## 2022-02-18 LAB — SEDIMENTATION RATE: Sed Rate: 44 mm/hr — ABNORMAL HIGH (ref 0–20)

## 2022-02-18 MED ORDER — PREDNISONE 10 MG PO TABS: 20.0000 mg | ORAL_TABLET | Freq: Every day | ORAL | 0 refills | Status: DC

## 2022-02-18 NOTE — Telephone Encounter (Signed)
Patient contacted.  Prescription entered and labs entered.

## 2022-02-18 NOTE — Telephone Encounter (Signed)
Please send prescription for prednisone 20 mg daily for 7 days followed by taper dose, decrease by 5 mg every 7 days until he finishes.  Please change CBC, CMP, ESR, CRP and TB QuantiFERON gold.  Stool C. difficile.  Follow-up in office visit soon either with me or APP.  Thank you

## 2022-02-18 NOTE — Telephone Encounter (Signed)
Inbound call from pt requesting to speak with a nurse , he has Chron's disease and is in pain and want to know what can he do about it .Marland KitchenPlease advise

## 2022-02-18 NOTE — Telephone Encounter (Addendum)
Patient calls from the pharmacy. He reports abdominal pain off and on for 3 weeks. He now has 1 to 2 diarrhea stools a day. His lower abdomen hurts. Denies bloody diarrhea, mucous stools or fever. Patient feels he needs prednisone. Confirmed he is using the Humira as prescribed. Not missing any doses per patient.

## 2022-02-19 ENCOUNTER — Other Ambulatory Visit: Payer: 59

## 2022-02-19 DIAGNOSIS — K5000 Crohn's disease of small intestine without complications: Secondary | ICD-10-CM

## 2022-02-20 LAB — QUANTIFERON-TB GOLD PLUS
Mitogen-NIL: >10 IU/mL
NIL: 0.19 IU/mL
QuantiFERON-TB Gold Plus: NEGATIVE
TB1-NIL: 0.03 IU/mL
TB2-NIL: 0.03 IU/mL

## 2022-02-21 LAB — CLOSTRIDIUM DIFFICILE BY PCR: Toxigenic C. Difficile by PCR: NEGATIVE

## 2022-03-05 ENCOUNTER — Other Ambulatory Visit: Payer: 59

## 2022-03-05 ENCOUNTER — Ambulatory Visit (INDEPENDENT_AMBULATORY_CARE_PROVIDER_SITE_OTHER): Payer: 59 | Admitting: Gastroenterology

## 2022-03-05 ENCOUNTER — Encounter: Payer: Self-pay | Admitting: Gastroenterology

## 2022-03-05 ENCOUNTER — Other Ambulatory Visit (INDEPENDENT_AMBULATORY_CARE_PROVIDER_SITE_OTHER): Payer: 59

## 2022-03-05 VITALS — BP 116/60 | HR 68 | Ht 70.0 in | Wt 180.0 lb

## 2022-03-05 DIAGNOSIS — K5000 Crohn's disease of small intestine without complications: Secondary | ICD-10-CM

## 2022-03-05 DIAGNOSIS — D509 Iron deficiency anemia, unspecified: Secondary | ICD-10-CM

## 2022-03-05 DIAGNOSIS — K219 Gastro-esophageal reflux disease without esophagitis: Secondary | ICD-10-CM

## 2022-03-05 LAB — IBC + FERRITIN
Ferritin: 17.8 ng/mL — ABNORMAL LOW (ref 22.0–322.0)
Iron: 77 ug/dL (ref 42–165)
Saturation Ratios: 20.4 % (ref 20.0–50.0)
TIBC: 378 ug/dL (ref 250.0–450.0)
Transferrin: 270 mg/dL (ref 212.0–360.0)

## 2022-03-05 LAB — FOLATE: Folate: 13 ng/mL (ref >5.9)

## 2022-03-05 LAB — VITAMIN B12: Vitamin B-12: 458 pg/mL (ref 211–911)

## 2022-03-05 NOTE — Patient Instructions (Signed)
Your provider has requested that you go to the basement level for lab work before leaving today. Press "B" on the elevator. The lab is located at the first door on the left as you exit the elevator.  _______________________________________________________  If your blood pressure at your visit was 140/90 or greater, please contact your primary care physician to follow up on this.  _______________________________________________________  If you are age 58 or older, your body mass index should be between 23-30. Your Body mass index is 25.83 kg/m. If this is out of the aforementioned range listed, please consider follow up with your Primary Care Provider.  If you are age 70 or younger, your body mass index should be between 19-25. Your Body mass index is 25.83 kg/m. If this is out of the aformentioned range listed, please consider follow up with your Primary Care Provider.   ________________________________________________________  The [facility] providers would like to encourage you to use [facility] to communicate with providers for non-urgent requests or questions.  Due to long hold times on the telephone, sending your provider a message by [facility] may be a faster and more efficient way to get a response.  Please allow 48 business hours for a response.  Please remember that this is for non-urgent requests.  _______________________________________________________

## 2022-03-05 NOTE — Progress Notes (Signed)
03/05/2022 Miguel Pena [id number] 09/02/1965   HISTORY OF PRESENT ILLNESS: This is a 57 year old male who is a patient of Dr. Woodward Ku.  He has Crohn's disease and is maintained on Humira injections every 2 weeks.  He called here couple of weeks ago with complaints of diarrhea and abdominal pain.  Stool for C. difficile was negative.  CRP was elevated, but much improved compared to 4 months ago.  Sed rate elevated.  He was placed on prednisone 20 mg daily to be tapered by 5 mg weekly.  He is still obviously on the prednisone, but symptoms much improved since being on prednisone taper.  All symptoms resolved actually.  May have some IBS overlap as well as he says that the time his symptoms started worsening was very high stress at work.   He has been started on buspirone recently as well.  Has dicyclomine to use as needed as well.  Was not having any rectal bleeding or mucus.  Last seen here November 2023.  Past Medical History:  Diagnosis Date   Bursitis of right shoulder    CHF (congestive heart failure) ([place])    [name] 10/24/2016   Chronic combined systolic and diastolic heart failure ([place]) 03/16/2017   Coronary artery disease    Dilated cardiomyopathy ([place])    [name] 10/24/2016   Dysrhythmia 1995   "VT" per patient- states had neg stress test and no problems since   Encounter for adjustment or management of automatic implantable cardioverter-defibrillator 06/17/2018   Encounter for assessment of automatic implantable cardioverter-defibrillator (AICD) 06/17/2018   Heart murmur    "when I was young"   History of kidney stones    Hypercholesterolemia    ICD: Medtronic MRI Quad CRTD (Bi-V ICD) implantation 03/16/2017 [name] 03/16/2017   Scheduled Remote ICD check   8.7.20: No VHR episodes. No mode switches. Normal health trends. Trans-thoracic impedance trends and the OptiVol Fluid Index do no present significant abnormalities. Battery longevity is 6.5-8.3 years. RA pacing is  8.3 %, RV pacing is 99.9 %, and LV pacing is 99.9 %.  Clinic: 05/18/17   NICM (nonischemic cardiomyopathy) ([place]) 03/16/2017   Past Surgical History:  Procedure Laterality Date   BIOPSY  12/06/2018   Procedure: BIOPSY;  Surgeon: Mauri Pole, MD;  Location: [facility];  Service: Endoscopy;;  Egd and Colon   BIV ICD INSERTION CRT-D N/A 03/16/2017   Procedure: BIV ICD INSERTION CRT-D;  Surgeon: Deboraha Sprang, MD;  Location: [place] [facility];  Service: Cardiovascular;  Laterality: N/A;   BUBBLE STUDY  11/30/2019   Procedure: BUBBLE STUDY;  Surgeon: Rex Kras, DO;  Location: [place] [facility];  Service: Cardiovascular;;   COLONOSCOPY WITH PROPOFOL N/A 03/10/2017   Procedure: COLONOSCOPY WITH PROPOFOL;  Surgeon: Mauri Pole, MD;  Location: [facility];  Service: Endoscopy;  Laterality: N/A;   COLONOSCOPY WITH PROPOFOL N/A 12/06/2018   Procedure: COLONOSCOPY WITH PROPOFOL;  Surgeon: Mauri Pole, MD;  Location: [facility];  Service: Endoscopy;  Laterality: N/A;   CORONARY ANGIOPLASTY WITH STENT PLACEMENT  10/28/2016   CORONARY STENT INTERVENTION N/A 10/28/2016   Procedure: CORONARY STENT INTERVENTION;  Surgeon: [name], MD;  Location: [place] [facility];  Service: Cardiovascular;  Laterality: N/A;   ESOPHAGOGASTRODUODENOSCOPY (EGD) WITH PROPOFOL N/A 03/10/2017   Procedure: ESOPHAGOGASTRODUODENOSCOPY (EGD) WITH PROPOFOL;  Surgeon: Mauri Pole, MD;  [facility]: [facility];  Service: Endoscopy;  Laterality: N/A;   ESOPHAGOGASTRODUODENOSCOPY (EGD) WITH PROPOFOL N/A 12/06/2018   Procedure:  ESOPHAGOGASTRODUODENOSCOPY (EGD) WITH PROPOFOL;  Surgeon: Mauri Pole, MD;  [facility]: [facility];  Service: Endoscopy;  Laterality: N/A;   IMPLANTABLE CARDIOVERTER DEFIBRILLATOR (ICD) GENERATOR CHANGE N/A 03/28/2021   Procedure: ICD GENERATOR CHANGE LV LEAD EXTRACTION AND LV LEAD REPLACEMENT;  Surgeon: Evans Lance, MD;  Location: [place];  Service: Cardiovascular;   Laterality: N/A;   INGUINAL HERNIA REPAIR Right 02/16/2013   Procedure: RIGHT INGUINAL HERNIA REPAIR ;  Surgeon: [name], MD;  Location: [facility];  Service: General;  Laterality: Right;  With MESH   INGUINAL HERNIA REPAIR Right 02/16/2013   INTRAVASCULAR PRESSURE WIRE/FFR STUDY N/A 10/28/2016   Procedure: INTRAVASCULAR PRESSURE WIRE/FFR STUDY;  Surgeon: [name], MD;  Location: [place] [facility];  Service: Cardiovascular;  Laterality: N/A;   RIGHT/LEFT HEART CATH AND CORONARY ANGIOGRAPHY N/A 10/28/2016   Procedure: RIGHT/LEFT HEART CATH AND CORONARY ANGIOGRAPHY;  Surgeon: [name], MD;  Location: [place] [facility];  Service: Cardiovascular;  Laterality: N/A;   TEE WITHOUT CARDIOVERSION N/A 11/30/2019   Procedure: TRANSESOPHAGEAL ECHOCARDIOGRAM (TEE);  Surgeon: Rex Kras, DO;  Location: [place] [facility];  Service: Cardiovascular;  Laterality: N/A;   ULTRASOUND GUIDANCE FOR VASCULAR ACCESS  10/28/2016   Procedure: Ultrasound Guidance For Vascular Access;  Surgeon: [name], MD;  Location: [place] [facility];  Service: Cardiovascular;;    reports that he quit smoking about 11 years ago. His smoking use included cigarettes and cigars. He has a 12.00 pack-year smoking history. He has quit using smokeless tobacco.  His smokeless tobacco use included snuff. He reports that he does not drink alcohol and does not use drugs. family history includes Colon polyps in his brother and father; Heart attack in his father. No Known Allergies    Outpatient Encounter Medications as of 03/05/2022  Medication Sig   bisoprolol (ZEBETA) 5 MG tablet Take 1 tablet (5 mg total) by mouth daily.   busPIRone (BUSPAR) 5 MG tablet Take by mouth.   dicyclomine (BENTYL) 10 MG capsule TAKE ONE CAPSULE BY MOUTH THREE TIMES DAILY(NOON, 4:00 PM AND EVENING) (Patient taking differently: Take 10 mg by mouth in the morning and at bedtime.)   furosemide (LASIX) 40 MG tablet Take 1 tablet (40 mg  total) by mouth every other day.   Homeopathic Products (ZICAM ALLERGY RELIEF NA) Place 1 spray into the nose daily as needed (allergies).   HUMIRA, 2 PEN, 40 MG/0.4ML PNKT INJECT '40MG'$  SUBCUTANEOUSLY EVERY 2 WEEKS   HYDROcodone-acetaminophen (NORCO) 10-325 MG tablet Take 1 tablet by mouth 6 (six) times daily.   Multiple Vitamin (MULTIVITAMIN WITH MINERALS) TABS tablet Take 1 tablet by mouth in the morning.   Multiple Vitamins-Minerals (HAIR/SKIN/NAILS) CAPS Take 2 capsules by mouth daily.   nicotine polacrilex (NICORETTE) 4 MG gum Take 4 mg by mouth as needed for smoking cessation.   omega-3 acid ethyl esters (LOVAZA) 1 g capsule Take 1 g by mouth 2 (two) times daily.   pantoprazole (PROTONIX) 40 MG tablet Take 1 tablet (40 mg total) by mouth daily. Before breakfast   predniSONE (DELTASONE) 10 MG tablet Take 2 tablets (20 mg total) by mouth daily with breakfast. Decrease by 5 mg every 7 days   rosuvastatin (CRESTOR) 40 MG tablet TAKE 1 TABLET(40 MG) BY MOUTH DAILY (Patient taking differently: Take 40 mg by mouth every evening.)   sacubitril-valsartan (ENTRESTO) 97-103 MG Take 1 tablet by mouth 2 (two) times daily.   spironolactone (ALDACTONE) 25 MG tablet Take 1 tablet (25 mg total)  by mouth daily.   No facility-administered encounter medications on file as of 03/05/2022.     REVIEW OF SYSTEMS  : All other systems reviewed and negative except where noted in the History of Present Illness.   PHYSICAL EXAM: BP 116/60   Pulse 68   Ht '5\' 10"'$  (1.778 m)   Wt 180 lb (81.6 kg)   SpO2 97%   BMI 25.83 kg/m  General: Well developed white male in no acute distress Head: Normocephalic and atraumatic Eyes:  Sclerae anicteric, conjunctiva pink. Ears: Normal auditory acuity Lungs: Clear throughout to auscultation; no W/R/R. Heart: Regular rate and rhythm; no M/R/G. Abdomen: Soft, non-distended.  BS present.  Non-tender. Musculoskeletal: Symmetrical with no gross deformities  Skin: No lesions on  visible extremities Extremities: No edema  Neurological: Alert oriented x 4, grossly non-focal Psychological:  Alert and cooperative. Normal mood and affect  ASSESSMENT AND PLAN: *Crohn's disease: Improved since being on prednisone taper.  All symptoms resolved.  C. difficile is negative.  CRP is high, but much lower than 4 months ago.  May have some IBS overlap as well as he says that at the time his symptoms started worsening was very high stress at work.   He has been started on buspirone recently as well.  Continue Humira maintenance dose every 2 weeks for now.  Dicyclomine as needed.  Complete prednisone taper.  He was supposed to have iron studies, B12, and folate checked when he was here in November and never had those drawn so he will do that today.  Otherwise he will be seen back with Dr. Silverio Decamp in 3 months, sooner if needed.  He will let us know if any other issues pop up in the interim.   CC:  [name], *

## 2022-04-12 ENCOUNTER — Other Ambulatory Visit: Payer: Self-pay | Admitting: Cardiology

## 2022-04-12 DIAGNOSIS — E781 Pure hyperglyceridemia: Secondary | ICD-10-CM

## 2022-04-21 ENCOUNTER — Encounter: Payer: Self-pay | Admitting: Cardiology

## 2022-04-22 NOTE — Progress Notes (Signed)
Reviewed and agree with documentation and assessment and plan. K. Veena Pape Parson , MD   

## 2022-04-24 ENCOUNTER — Encounter: Payer: Self-pay | Admitting: Cardiology

## 2022-05-11 ENCOUNTER — Other Ambulatory Visit: Payer: Self-pay | Admitting: Gastroenterology

## 2022-05-14 ENCOUNTER — Other Ambulatory Visit: Payer: Self-pay | Admitting: Cardiology

## 2022-05-25 ENCOUNTER — Other Ambulatory Visit: Payer: Self-pay | Admitting: Cardiology

## 2022-06-04 ENCOUNTER — Other Ambulatory Visit: Payer: Self-pay | Admitting: Cardiology

## 2022-06-04 ENCOUNTER — Ambulatory Visit (INDEPENDENT_AMBULATORY_CARE_PROVIDER_SITE_OTHER): Payer: 59 | Admitting: Gastroenterology

## 2022-06-04 ENCOUNTER — Other Ambulatory Visit (INDEPENDENT_AMBULATORY_CARE_PROVIDER_SITE_OTHER): Payer: 59

## 2022-06-04 ENCOUNTER — Encounter: Payer: Self-pay | Admitting: Gastroenterology

## 2022-06-04 VITALS — BP 100/66 | HR 59 | Ht 70.0 in | Wt 183.6 lb

## 2022-06-04 DIAGNOSIS — K5000 Crohn's disease of small intestine without complications: Secondary | ICD-10-CM

## 2022-06-04 DIAGNOSIS — D508 Other iron deficiency anemias: Secondary | ICD-10-CM

## 2022-06-04 LAB — HIGH SENSITIVITY CRP: CRP, High Sensitivity: 2.61 mg/L (ref 0.000–5.000)

## 2022-06-04 LAB — HEMOGLOBIN: Hemoglobin: 11.5 g/dL — ABNORMAL LOW (ref 13.0–17.0)

## 2022-06-04 LAB — FERRITIN: Ferritin: 24.7 ng/mL (ref 22.0–322.0)

## 2022-06-04 NOTE — Patient Instructions (Signed)
Your provider has requested that you go to the basement level for lab work before leaving today. Press "B" on the elevator. The lab is located at the first door on the left as you exit the elevator.   We have scheduled you for a hemorrhoidal banding, I will contact you if someone cancels for a sooner appointment  Use Anusol Suppositories and Preparation H at bedtime as needed  Due to recent changes in healthcare laws, you may see the results of your imaging and laboratory studies on MyChart before your provider has had a chance to review them.  We understand that in some cases there may be results that are confusing or concerning to you. Not all laboratory results come back in the same time frame and the provider may be waiting for multiple results in order to interpret others.  Please give [place] 48 hours in order for your provider to thoroughly review all the results before contacting the office for clarification of your results.    _______________________________________________________  If your blood pressure at your visit was 140/90 or greater, please contact your primary care physician to follow up on this.  _______________________________________________________  If you are age 49 or older, your body mass index should be between 23-30. Your Body mass index is 26.34 kg/m. If this is out of the aforementioned range listed, please consider follow up with your Primary Care Provider.  If you are age 59 or younger, your body mass index should be between 19-25. Your Body mass index is 26.34 kg/m. If this is out of the aformentioned range listed, please consider follow up with your Primary Care Provider.   ________________________________________________________  The [facility] providers would like to encourage you to use [facility] to communicate with providers for non-urgent requests or questions.  Due to long hold times on the telephone, sending your provider a message by [facility] may be a faster  and more efficient way to get a response.  Please allow 48 business hours for a response.  Please remember that this is for non-urgent requests.  _______________________________________________________   I appreciate the  opportunity to care for you  Thank You   Marsa Aris , MD

## 2022-06-04 NOTE — Progress Notes (Signed)
Miguel Pena    [id number]    Mar 22, 1965  Primary Care Physician:Elkins, Curly Rim, MD  Referring Physician: Kaleen Mask, MD [address] [place],  [place] [address]   Chief complaint: Crohn's disease  HPI:  57 year old very pleasant gentleman with history of CHF, CAD on Plavix, follow-up visit for Crohn's disease.   Miguel Pena last saw him on 11-19-21. At that time he was doing well overall. He is on Humira and is tolerating it well.  Today, reports feeling well overall. He does reports some minimal blood in stool that typically occurs 1-2x a week usually when staying on the toilet for a long time. He attributes the blood in his stool to his hemorrhoids. He denies any straining with his BM. He reports good compliance and tolerance of Humira which he takes every 2 weeks.   Completed his prednisone regimen few months ago and denies any symptoms since finishing it up.  He's been taking a multivitamin regularly   he denies diarrhea, constipation, nausea, black stool, vomiting, abdominal pain, bloating, unintentional weight loss, reflux, dysphagia.   GI Hx:  He is s/p CRT-D March 2019.     Relevant GI history: EGD December 06, 2018: LA grade C esophagitis and gastritis.  H. pylori negative   Colonoscopy December 06, 2018: Terminal ileum ulceration with ileitis, biopsies consistent with IBD.  Diverticulosis and internal hemorrhoids otherwise normal exam.   Capsule endoscopy October 22, 2018: Multiple aphthous ulcers and inflammation throughout small bowel, predominantly in the distal small bowel consistent with Crohn's ileitis     03/10/2017 EGD : LA grade C reflux and candidiasis esophagitis, benign-appearing esophageal stenosis, gastritis with hemorrhage, duodenal erosions without bleeding otherwise normal.  Started on Nexium 40 twice daily.      03/10/2017  Colonoscopy : moderate diverticulosis in the sigmoid and descending colon, narrowing of the colon in  association with diverticular opening, peridiverticular erythema, 1 5 mm polyp in the sigmoid colon and nonbleeding internal hemorrhoids.   Pathology showed chronic gastritis and reflux esophagitis negative for H. pylori or dysplasia.  Polyp removed was hyperplastic.  Recall colonoscopy recommended in 10 years.      CT abdomen pelvis with contrast 09/29/2018 with inflammatory changes and thickening of the distal and terminal ileum which may represent infectious enteritis or an inflammatory bowel disease.  No bowel obstruction, normal appendix.  Sigmoid diverticulosis and cholelithiasis.   Current Outpatient Medications:    bisoprolol (ZEBETA) 5 MG tablet, Take 1 tablet (5 mg total) by mouth daily., Disp: 90 tablet, Rfl: 3   busPIRone (BUSPAR) 5 MG tablet, Take by mouth., Disp: , Rfl:    dicyclomine (BENTYL) 10 MG capsule, TAKE ONE CAPSULE BY MOUTH THREE TIMES DAILY(NOON, 4:00 PM AND EVENING) (Patient taking differently: Take 10 mg by mouth in the morning and at bedtime.), Disp: 90 capsule, Rfl: 3   furosemide (LASIX) 40 MG tablet, Take 1 tablet (40 mg total) by mouth every other day., Disp: 45 tablet, Rfl: 3   Homeopathic Products (ZICAM ALLERGY RELIEF NA), Place 1 spray into the nose daily as needed (allergies)., Disp: , Rfl:    HUMIRA, 2 PEN, 40 MG/0.4ML PNKT, INJECT 40MG  SUBCUTANEOUSLY EVERY 2 WEEKS, Disp: 2 each, Rfl: 3   HYDROcodone-acetaminophen (NORCO) 10-325 MG tablet, Take 1 tablet by mouth 6 (six) times daily., Disp: , Rfl:    Multiple Vitamin (MULTIVITAMIN WITH MINERALS) TABS tablet, Take 1 tablet by mouth in the morning., Disp: ,  Rfl:    Multiple Vitamins-Minerals (HAIR/SKIN/NAILS) CAPS, Take 2 capsules by mouth daily., Disp: , Rfl:    nicotine polacrilex (NICORETTE) 4 MG gum, Take 4 mg by mouth as needed for smoking cessation., Disp: , Rfl:    omega-3 acid ethyl esters (LOVAZA) 1 g capsule, Take 1 g by mouth 2 (two) times daily., Disp: , Rfl:    pantoprazole (PROTONIX) 40 MG tablet,  Take 1 tablet (40 mg total) by mouth daily. Before breakfast, Disp: 90 tablet, Rfl: 3   predniSONE (DELTASONE) 10 MG tablet, Take 2 tablets (20 mg total) by mouth daily with breakfast. Decrease by 5 mg every 7 days, Disp: 50 tablet, Rfl: 0   rosuvastatin (CRESTOR) 40 MG tablet, TAKE 1 TABLET(40 MG) BY MOUTH DAILY, Disp: 90 tablet, Rfl: 3   sacubitril-valsartan (ENTRESTO) 97-103 MG, Take 1 tablet by mouth 2 (two) times daily., Disp: 180 tablet, Rfl: 3   spironolactone (ALDACTONE) 25 MG tablet, Take 1 tablet (25 mg total) by mouth daily., Disp: 90 tablet, Rfl: 1    Allergies as of 06/04/2022   (No Known Allergies)    Past Medical History:  Diagnosis Date   Bursitis of right shoulder    CHF (congestive heart failure) (HCC)    Hattie Perch 10/24/2016   Chronic combined systolic and diastolic heart failure (HCC) 03/16/2017   Coronary artery disease    Dilated cardiomyopathy (HCC)    Hattie Perch 10/24/2016   Dysrhythmia 1995   "VT" per patient- states had neg stress test and no problems since   Encounter for adjustment or management of automatic implantable cardioverter-defibrillator 06/17/2018   Encounter for assessment of automatic implantable cardioverter-defibrillator (AICD) 06/17/2018   Heart murmur    "when Miguel Pena was young"   History of kidney stones    Hypercholesterolemia    ICD: Medtronic MRI Quad CRTD (Bi-V ICD) implantation 03/16/2017 Graciela Husbands 03/16/2017   Scheduled Remote ICD check   8.7.20: No VHR episodes. No mode switches. Normal health trends. Trans-thoracic impedance trends and the OptiVol Fluid Index do no present significant abnormalities. Battery longevity is 6.5-8.3 years. RA pacing is 8.3 %, RV pacing is 99.9 %, and LV pacing is 99.9 %.  Clinic: 05/18/17   NICM (nonischemic cardiomyopathy) (HCC) 03/16/2017    Past Surgical History:  Procedure Laterality Date   BIOPSY  12/06/2018   Procedure: BIOPSY;  Surgeon: Napoleon Form, MD;  Location: [facility];  Service: Endoscopy;;   Egd and Colon   BIV ICD INSERTION CRT-D N/A 03/16/2017   Procedure: BIV ICD INSERTION CRT-D;  Surgeon: Duke Salvia, MD;  Location: [facility] INVASIVE CV LAB;  Service: Cardiovascular;  Laterality: N/A;   BUBBLE STUDY  11/30/2019   Procedure: BUBBLE STUDY;  Surgeon: Tessa Lerner, DO;  Location: [facility];  Service: Cardiovascular;;   COLONOSCOPY WITH PROPOFOL N/A 03/10/2017   Procedure: COLONOSCOPY WITH PROPOFOL;  Surgeon: Napoleon Form, MD;  Location: [facility];  Service: Endoscopy;  Laterality: N/A;   COLONOSCOPY WITH PROPOFOL N/A 12/06/2018   Procedure: COLONOSCOPY WITH PROPOFOL;  Surgeon: Napoleon Form, MD;  Location: [facility];  Service: Endoscopy;  Laterality: N/A;   CORONARY ANGIOPLASTY WITH STENT PLACEMENT  10/28/2016   CORONARY PRESSURE/FFR STUDY N/A 10/28/2016   Procedure: INTRAVASCULAR PRESSURE WIRE/FFR STUDY;  Surgeon: Elder Negus, MD;  Location: [facility] INVASIVE CV LAB;  Service: Cardiovascular;  Laterality: N/A;   CORONARY STENT INTERVENTION N/A 10/28/2016   Procedure: CORONARY STENT INTERVENTION;  Surgeon: Elder Negus, MD;  Location: [facility] INVASIVE CV LAB;  Service: Cardiovascular;  Laterality: N/A;   ESOPHAGOGASTRODUODENOSCOPY (EGD) WITH PROPOFOL N/A 03/10/2017   Procedure: ESOPHAGOGASTRODUODENOSCOPY (EGD) WITH PROPOFOL;  Surgeon: Napoleon Form, MD;  Location: [facility];  Service: Endoscopy;  Laterality: N/A;   ESOPHAGOGASTRODUODENOSCOPY (EGD) WITH PROPOFOL N/A 12/06/2018   Procedure: ESOPHAGOGASTRODUODENOSCOPY (EGD) WITH PROPOFOL;  Surgeon: Napoleon Form, MD;  Location: [facility];  Service: Endoscopy;  Laterality: N/A;   IMPLANTABLE CARDIOVERTER DEFIBRILLATOR (ICD) GENERATOR CHANGE N/A 03/28/2021   Procedure: ICD GENERATOR CHANGE LV LEAD EXTRACTION AND LV LEAD REPLACEMENT;  Surgeon: Marinus Maw, MD;  Location: [facility] OR;  Service: Cardiovascular;  Laterality: N/A;   INGUINAL HERNIA REPAIR Right 02/16/2013   Procedure: RIGHT INGUINAL HERNIA  REPAIR ;  Surgeon: Valarie Merino, MD;  Location: [facility] ORS;  Service: General;  Laterality: Right;  With MESH   INGUINAL HERNIA REPAIR Right 02/16/2013   RIGHT/LEFT HEART CATH AND CORONARY ANGIOGRAPHY N/A 10/28/2016   Procedure: RIGHT/LEFT HEART CATH AND CORONARY ANGIOGRAPHY;  Surgeon: Elder Negus, MD;  Location: [facility]VAS[facility] CV LAB;  Service: Cardiovascular;  Laterality: N/A;   TEE WITHOUT CARDIOVERSION N/A 11/30/2019   Procedure: TRANSESOPHAGEAL ECHOCARDIOGRAM (TEE);  Surgeon: Tessa Lerner, DO;  Location: [facility];  Service: Cardiovascular;  Laterality: N/A;   ULTRASOUND GUIDANCE FOR VASCULAR ACCESS  10/28/2016   Procedure: Ultrasound Guidance For Vascular Access;  Surgeon: Elder Negus, MD;  Location: [facility]AS[facility] CV LAB;  Service: Cardiovascular;;    Family History  Problem Relation Age of Onset   Heart attack Father    Colon polyps Father    Colon polyps Brother    Colon cancer Neg Hx    Stomach cancer Neg Hx    Pancreatic cancer Neg Hx    Esophageal cancer Neg Hx    Liver disease Neg Hx     Social History   Socioeconomic History   Marital status: Married    Spouse name: Not on file   Number of children: 2   Years of education: Not on file   Highest education level: Not on file  Occupational History   Occupation: Transport planner    Comment: [employer]  Tobacco Use   Smoking status: Former    Packs/day: 1.00    Years: 12.00    Additional pack years: 0.00    Total pack years: 12.00    Types: Cigarettes, Cigars    Quit date: 02/16/2011    Years since quitting: 11.3   Smokeless tobacco: Former    Types: Snuff   Tobacco comments:    10/28/2016 'chewed for a couple years after Miguel Pena quit smoking"  Vaping Use   Vaping Use: Never used  Substance and Sexual Activity   Alcohol use: No   Drug use: No   Sexual activity: Not Currently  Other Topics Concern   Not on file  Social History Narrative   Not on file   Social Determinants of Health    Financial Resource Strain: Not on file  Food Insecurity: Not on file  Transportation Needs: Not on file  Physical Activity: Not on file  Stress: Not on file  Social Connections: Not on file  Intimate Partner Violence: Not on file      Review of systems: Review of Systems  Constitutional:  Negative for unexpected weight change.  HENT:  Negative for trouble swallowing.   Gastrointestinal:  Positive for blood in stool. Negative for abdominal distention, abdominal pain, anal bleeding, constipation, diarrhea, nausea, rectal pain and vomiting.      Physical  Exam: Vitals:   06/04/22 0826  BP: 100/66  Pulse: (!) 59   Body mass index is 26.34 kg/m. General: well-appearing   Eyes: sclera anicteric, no redness ENT: oral mucosa moist without lesions, no cervical or supraclavicular lymphadenopathy CV: RRR, no JVD, no peripheral edema Resp: clear to auscultation bilaterally, normal RR and effort noted GI: soft, no tenderness, with active bowel sounds. No guarding or palpable organomegaly noted. Skin; warm and dry, no rash or jaundice noted Neuro: awake, alert and oriented x 3. Normal gross motor function and fluent speech   Data Reviewed:  Reviewed labs, radiology imaging, old records and pertinent past GI work up   Assessment and Plan/Recommendations:  57 year old very pleasant gentleman with history of CAD, CHF s/p CRT-D, GERD and Crohn's disease   Crohn's disease: Overall symptoms have improved, in clinical remission Continue Humira maintenance dose every 2 weeks Follow-up LFT and CRP   Abdominal discomfort: Use dicyclomine 10 mg every 8 hours as needed   GERD: Use pantoprazole 40 mg daily Continue antireflux measures    -Hemoglobin, ferritin  -Schedule hemorrhoidal banding   Return in 3 months   The patient was provided an opportunity to ask questions and all were answered. The patient agreed with the plan and demonstrated an understanding of the  instructions.  Iona Beard , MD    CC: Kaleen Mask, *  Miguel Pena,Miguel Pena,acting as a scribe for Miguel Aris, MD.,have documented all relevant documentation on the behalf of Miguel Aris, MD,as directed by  Miguel Aris, MD while in the presence of Miguel Aris, MD.   Miguel Pena, Miguel Aris, MD, have reviewed all documentation for this visit. The documentation on 06/04/22 for the exam, diagnosis, procedures, and orders are all accurate and complete.

## 2022-06-06 ENCOUNTER — Other Ambulatory Visit: Payer: Self-pay | Admitting: Cardiology

## 2022-06-11 ENCOUNTER — Encounter: Payer: Self-pay | Admitting: Gastroenterology

## 2022-07-16 ENCOUNTER — Ambulatory Visit (INDEPENDENT_AMBULATORY_CARE_PROVIDER_SITE_OTHER): Payer: 59 | Admitting: Gastroenterology

## 2022-07-16 ENCOUNTER — Encounter: Payer: Self-pay | Admitting: Gastroenterology

## 2022-07-16 VITALS — BP 102/60 | HR 97 | Ht 70.0 in | Wt 181.5 lb

## 2022-07-16 DIAGNOSIS — K641 Second degree hemorrhoids: Secondary | ICD-10-CM

## 2022-07-16 NOTE — Progress Notes (Signed)
PROCEDURE NOTE: The patient presents with symptomatic grade II  hemorrhoids, requesting rubber band ligation of his/her hemorrhoidal disease.  All risks, benefits and alternative forms of therapy were described and informed consent was obtained.  In the Left Lateral Decubitus position anoscopic examination revealed grade II hemorrhoids in the right posterior, left lateral and right anterior position(s).  The anorectum was pre-medicated with 0.125% nitroglycerin and RectiCare The decision was made to band the right posterior internal hemorrhoid, and the [facility] O'Regan System was used to perform band ligation without complication.  Digital anorectal examination was then performed to assure proper positioning of the band, and to adjust the banded tissue as required.  The patient was discharged home without pain or other issues.  Dietary and behavioral recommendations were given and along with follow-up instructions.      The patient will return in 3 to 4 weeks for  follow-up and possible additional banding as required. No complications were encountered and the patient tolerated the procedure well.  Iona Beard , MD [phone]

## 2022-07-16 NOTE — Patient Instructions (Addendum)

## 2022-07-17 ENCOUNTER — Encounter: Payer: Self-pay | Admitting: Gastroenterology

## 2022-07-31 ENCOUNTER — Other Ambulatory Visit: Payer: Self-pay | Admitting: Cardiology

## 2022-07-31 DIAGNOSIS — I5020 Unspecified systolic (congestive) heart failure: Secondary | ICD-10-CM

## 2022-08-27 ENCOUNTER — Other Ambulatory Visit: Payer: Self-pay | Admitting: Gastroenterology

## 2022-08-27 ENCOUNTER — Ambulatory Visit (INDEPENDENT_AMBULATORY_CARE_PROVIDER_SITE_OTHER): Payer: 59 | Admitting: Gastroenterology

## 2022-08-27 ENCOUNTER — Encounter: Payer: Self-pay | Admitting: Gastroenterology

## 2022-08-27 VITALS — BP 90/60 | HR 54 | Ht 70.0 in | Wt 177.0 lb

## 2022-08-27 DIAGNOSIS — K641 Second degree hemorrhoids: Secondary | ICD-10-CM

## 2022-08-27 NOTE — Progress Notes (Signed)
PROCEDURE NOTE: The patient presents with symptomatic grade II  hemorrhoids, requesting rubber band ligation of his/her hemorrhoidal disease.  All risks, benefits and alternative forms of therapy were described and informed consent was obtained.   The anorectum was pre-medicated with 0.125% NTG and Recticare The decision was made to band the left lateral internal hemorrhoid, and the [facility] O'Regan System was used to perform band ligation without complication.  Digital anorectal examination was then performed to assure proper positioning of the band, and to adjust the banded tissue as required.  The patient was discharged home without pain or other issues.  Dietary and behavioral recommendations were given and along with follow-up instructions.     The patient will return in 6-8 weeks for  follow-up and possible additional banding as required. No complications were encountered and the patient tolerated the procedure well.  Iona Beard , MD [phone]

## 2022-08-27 NOTE — Patient Instructions (Signed)
HEMORRHOID BANDING PROCEDURE  ? ? FOLLOW-UP CARE ? ? ?The procedure you have had should have been relatively painless since the banding of the area involved does not have nerve endings and there is no pain sensation.  The rubber band cuts off the blood supply to the hemorrhoid and the band may fall off as soon as 48 hours after the banding (the band may occasionally be seen in the toilet bowl following a bowel movement). You may notice a temporary feeling of fullness in the rectum which should respond adequately to plain Tylenol? or Motrin?. ? ?Following the banding, avoid strenuous exercise that evening and resume full activity the next day.  A sitz bath (soaking in a warm tub) or bidet is soothing, and can be useful for cleansing the area after bowel movements.   ? ? ?To avoid constipation, take two tablespoons of natural wheat bran, natural oat bran, flax, Benefiber? or any over the counter fiber supplement and increase your water intake to 7-8 glasses daily.   ? ?Unless you have been prescribed anorectal medication, do not put anything inside your rectum for two weeks: No suppositories, enemas, fingers, etc. ? ?Occasionally, you may have more bleeding than usual after the banding procedure.  This is often from the untreated hemorrhoids rather than the treated one.  Don?t be concerned if there is a tablespoon or so of blood.  If there is more blood than this, lie flat with your bottom higher than your head and apply an ice pack to the area. If the bleeding does not stop within a half an hour or if you feel faint, call our office at [phone] or go to the emergency room. ? ?Problems are not common; however, if there is a substantial amount of bleeding, severe pain, chills, fever or difficulty passing urine (very rare) or other problems, you should call us at [phone] or report to the nearest emergency room. ? ?Do not stay seated continuously for more than 2-3 hours for a day or two after the procedure.   Tighten your buttock muscles 10-15 times every two hours and take 10-15 deep breaths every 1-2 hours.  Do not spend more than a few minutes on the toilet if you cannot empty your bowel; instead re-visit the toilet at a later time. ? ? I appreciate the  opportunity to care for you ? ?Thank You  ? ?Harl Bowie , MD  ?

## 2022-10-09 ENCOUNTER — Other Ambulatory Visit (HOSPITAL_COMMUNITY): Payer: Self-pay

## 2022-10-31 ENCOUNTER — Telehealth: Payer: Self-pay | Admitting: Pharmacy Technician

## 2022-10-31 NOTE — Telephone Encounter (Signed)
Pharmacy Patient Advocate Encounter  Received notification from [facility] that Prior Authorization for HUMIRA 40MG  has been APPROVED from 10.3.24 to 10.3.25   PA #/Case ID/Reference #: [id number] Key: [id number]

## 2022-11-03 ENCOUNTER — Telehealth: Payer: Self-pay | Admitting: Pharmacy Technician

## 2022-11-03 NOTE — Telephone Encounter (Signed)
ERROR

## 2022-11-12 ENCOUNTER — Ambulatory Visit: Payer: Self-pay | Admitting: Cardiology

## 2022-11-12 ENCOUNTER — Other Ambulatory Visit: Payer: Self-pay

## 2022-11-12 MED ORDER — POTASSIUM CHLORIDE CRYS ER 20 MEQ PO TBCR: 20.0000 meq | EXTENDED_RELEASE_TABLET | Freq: Every day | ORAL | 0 refills | Status: DC

## 2022-11-14 ENCOUNTER — Other Ambulatory Visit: Payer: Self-pay | Admitting: Cardiology

## 2022-11-14 ENCOUNTER — Other Ambulatory Visit: Payer: Self-pay | Admitting: Gastroenterology

## 2022-11-14 DIAGNOSIS — I5020 Unspecified systolic (congestive) heart failure: Secondary | ICD-10-CM

## 2022-11-18 ENCOUNTER — Other Ambulatory Visit: Payer: Self-pay | Admitting: Cardiology

## 2022-11-18 DIAGNOSIS — I5020 Unspecified systolic (congestive) heart failure: Secondary | ICD-10-CM

## 2022-11-20 ENCOUNTER — Telehealth: Payer: Self-pay | Admitting: Gastroenterology

## 2022-11-20 NOTE — Telephone Encounter (Signed)
PA says Humira is authorized until October of 2025. Advised the patient of this. We will try to get his insurance to honor their promise. Asked him to keep his appointment in December. It will be in his best interests if we have documentation of his current response to Humira. Patient agrees to this plan of care.

## 2022-11-20 NOTE — Telephone Encounter (Signed)
As of January, the insurance will not cover Humira. Plan requires a switch to Amjevita. Please advise.

## 2022-11-21 ENCOUNTER — Other Ambulatory Visit: Payer: Self-pay

## 2022-11-21 MED ORDER — HUMIRA (2 PEN) 40 MG/0.4ML ~~LOC~~ AJKT: 40.0000 mg | AUTO-INJECTOR | SUBCUTANEOUS | 3 refills | Status: DC

## 2022-11-21 NOTE — Telephone Encounter (Signed)
Thank you :)

## 2022-12-09 ENCOUNTER — Encounter: Payer: Self-pay | Admitting: Cardiology

## 2022-12-09 ENCOUNTER — Ambulatory Visit: Payer: 59 | Attending: Cardiology | Admitting: Cardiology

## 2022-12-09 VITALS — BP 110/62 | HR 50 | Resp 16 | Ht 70.0 in | Wt 184.6 lb

## 2022-12-09 DIAGNOSIS — E782 Mixed hyperlipidemia: Secondary | ICD-10-CM

## 2022-12-09 DIAGNOSIS — I2510 Atherosclerotic heart disease of native coronary artery without angina pectoris: Secondary | ICD-10-CM

## 2022-12-09 DIAGNOSIS — I5042 Chronic combined systolic (congestive) and diastolic (congestive) heart failure: Secondary | ICD-10-CM | POA: Diagnosis not present

## 2022-12-09 MED ORDER — POTASSIUM CHLORIDE CRYS ER 20 MEQ PO TBCR: 20.0000 meq | EXTENDED_RELEASE_TABLET | Freq: Every day | ORAL | 1 refills | Status: AC | PRN

## 2022-12-09 MED ORDER — FUROSEMIDE 40 MG PO TABS: 40.0000 mg | ORAL_TABLET | Freq: Every day | ORAL | 1 refills | Status: AC | PRN

## 2022-12-09 NOTE — Progress Notes (Signed)
Cardiology Office Note:  .   Date:  12/09/2022  ID:  Miguel Pena, DOB November 02, 1965, MRN [id number] PCP: Kaleen Mask, MD   HeartCare Providers Cardiologist:  Truett Mainland, MD PCP: Kaleen Mask, MD  Chief Complaint  Patient presents with   NICM (nonischemic cardiomyopathy)   Follow-up    1 year   Coronary Artery Disease            History of Present Illness: .    Miguel Pena is a 57 y.o. male with nonischemic dilated cardiomyopathy with now mormalization of LVEF post CRT-D placement 03/2017 and s/p lead revision 03/2021 for lead dislodgement, single vessel CAD s/p successful PCI to OM1 with 3.5 X 16 mm DES 10/2016, hyperlipidemia, prior smoking history, testosterone deficiency, Chron's disease   Patient is doing well from cardiac standpoint, denies any chest pain, shortness of breath, palpitation symptoms.  He has been off aspirin since diagnosis of blood loss anemia due to hemorrhoids.  He sees Dr. Stephanie Coup for the same.  Vitals:   12/09/22 1353  BP: 110/62  Pulse: (!) 50  Resp: 16  SpO2: 98%     ROS:  Review of Systems  Cardiovascular:  Negative for chest pain, dyspnea on exertion, leg swelling, palpitations and syncope.     Studies Reviewed: Marland Kitchen       EKG 12/09/2022: Atrial-sensed ventricular-paced rhythm When compared with ECG of 29-Mar-2021 04:51, Vent. rate has decreased BY   7 BPM    Independently interpreted Labs 11/2022: LDL 29  Echocardiogram 10/07/2021: Normal LV systolic function with visual EF 60-65%. Left ventricle cavity is normal in size. Normal left ventricular wall thickness. Normal global wall motion. Abnormal septal wall motion due to ventricle pacemaker. Normal diastolic filling pattern and normal LAP (accuracy limited due to MAC and pacemaker). Mild calcification of the mitral valve annulus. No evidence of mitral stenosis.   Mild to moderate mitral regurgitation. Mild tricuspid regurgitation. No  evidence of pulmonary hypertension. Compared to 11/15/2019 LVEF improved from 50-55% to 60-65%, MR remains stable.   Physical Exam:   Physical Exam Vitals and nursing note reviewed.  Constitutional:      General: He is not in acute distress. Neck:     Vascular: No JVD.  Cardiovascular:     Rate and Rhythm: Normal rate and regular rhythm.     Heart sounds: Normal heart sounds. No murmur heard. Pulmonary:     Effort: Pulmonary effort is normal.     Breath sounds: Normal breath sounds. No wheezing or rales.      VISIT DIAGNOSES:   ICD-10-CM   1. Chronic combined systolic and diastolic heart failure (HCC)  Q46.96 EKG 12-Lead    potassium chloride SA (KLOR-CON M) 20 MEQ tablet    furosemide (LASIX) 40 MG tablet    Lipid Profile    2. Coronary artery disease involving native coronary artery of native heart without angina pectoris  I25.10 potassium chloride SA (KLOR-CON M) 20 MEQ tablet    furosemide (LASIX) 40 MG tablet    Lipid Profile    3. Mixed hyperlipidemia  E78.2 potassium chloride SA (KLOR-CON M) 20 MEQ tablet    furosemide (LASIX) 40 MG tablet    Lipid Profile       ASSESSMENT AND PLAN: .    Miguel Pena is a 57 y.o. male with  nonischemic dilated cardiomyopathy with now mormalization of LVEF post CRT-D placement 03/2017 and s/p lead revision 03/2021, single vessel CAD s/p successful  PCI to OM1 with 3.5 X 16 mm DES 10/2016, hyperlipidemia, prior smoking history, testosterone deficiency, Chron's disease    Nonischemic cardiomyopathy: S/p lead revision 03/2021. Clinically compensated. No CHF on ICD monitoring.  Continue bisoprolol to 5 mg daily, Entresto 97-103 mg bid, Spironolactone 25 mg daily. Resting HR <70 bpm. Corlanor not indicated.  Given complete EF recovery, reasonable not to add SGLT2 inhibitor at this time. Take Lasix only as needed for leg swelling.  Take potassium only when taking Lasix. EF normalized to 55% on above medical therapy (Echo 09/2017).    Mitral regurgitation: Mild to moderate, stable, asymptomatic. Recommend only clinical follow up.   CAD: Stable with no angina symnptoms. Conitnue statin. If and when okay with Dr. Lavon Paganini, patient should resume aspirin 81 mg daily.       Meds ordered this encounter  Medications   potassium chloride SA (KLOR-CON M) 20 MEQ tablet    Sig: Take 1 tablet (20 mEq total) by mouth daily as needed (take with as needed lasix.).    Dispense:  90 tablet    Refill:  1    Dose decrease   furosemide (LASIX) 40 MG tablet    Sig: Take 1 tablet (40 mg total) by mouth daily as needed (lower extremity swelling and weight gain).    Dispense:  90 tablet    Refill:  1    Dose decrease     F/u in 1 year  Signed, Elder Negus, MD

## 2022-12-09 NOTE — Patient Instructions (Signed)
Medication Instructions:   DECREASE YOUR LASIX TO 40 MG BY MOUTH DAILY AS NEEDED FOR LOWER EXTREMITY SWELLING  DECREASE YOUR POTASSIUM CHLORIDE TO 20 mEq BY MOUTH DAILY AS NEEDED WHEN YOU ADMINISTER YOUR AS NEEDED LASIX  *If you need a refill on your cardiac medications before your next appointment, please call your pharmacy*   Lab Work:  TODAY--LIPIDS--PLEASE GO DOWN TO THE FIRST FLOOR [facility] OF THIS BUILDING TO HAVE DRAWN  If you have labs (blood work) drawn today and your tests are completely normal, you will receive your results only by: MyChart Message (if you have MyChart) OR A paper copy in the mail If you have any lab test that is abnormal or we need to change your treatment, we will call you to review the results.     Follow-Up: At [facility], you and your health needs are our priority.  As part of our continuing mission to provide you with exceptional heart care, we have created designated Provider Care Teams.  These Care Teams include your primary Cardiologist (physician) and Advanced Practice Providers (APPs -  Physician Assistants and Nurse Practitioners) who all work together to provide you with the care you need, when you need it.  We recommend signing up for the patient portal called "MyChart".  Sign up information is provided on this After Visit Summary.  MyChart is used to connect with patients for Virtual Visits (Telemedicine).  Patients are able to view lab/test results, encounter notes, upcoming appointments, etc.  Non-urgent messages can be sent to your provider as well.   To learn more about what you can do with MyChart, go to [email or web address].    Your next appointment:   1 year(s)  Provider:    DR. Rosemary Holms

## 2022-12-10 ENCOUNTER — Ambulatory Visit: Payer: 59 | Admitting: Gastroenterology

## 2022-12-10 ENCOUNTER — Other Ambulatory Visit (INDEPENDENT_AMBULATORY_CARE_PROVIDER_SITE_OTHER): Payer: 59

## 2022-12-10 ENCOUNTER — Encounter: Payer: Self-pay | Admitting: Gastroenterology

## 2022-12-10 VITALS — BP 118/68 | HR 63 | Ht 70.0 in | Wt 186.0 lb

## 2022-12-10 DIAGNOSIS — K5000 Crohn's disease of small intestine without complications: Secondary | ICD-10-CM | POA: Diagnosis not present

## 2022-12-10 DIAGNOSIS — D509 Iron deficiency anemia, unspecified: Secondary | ICD-10-CM

## 2022-12-10 DIAGNOSIS — K219 Gastro-esophageal reflux disease without esophagitis: Secondary | ICD-10-CM

## 2022-12-10 DIAGNOSIS — K641 Second degree hemorrhoids: Secondary | ICD-10-CM

## 2022-12-10 LAB — CBC WITH DIFFERENTIAL/PLATELET
Basophils Absolute: 0.1 10*3/uL (ref 0.0–0.1)
Basophils Relative: 0.7 % (ref 0.0–3.0)
Eosinophils Absolute: 0.2 10*3/uL (ref 0.0–0.7)
Eosinophils Relative: 1.4 % (ref 0.0–5.0)
HCT: 34.3 % — ABNORMAL LOW (ref 39.0–52.0)
Hemoglobin: 11.5 g/dL — ABNORMAL LOW (ref 13.0–17.0)
Lymphocytes Relative: 21.5 % (ref 12.0–46.0)
Lymphs Abs: 2.4 10*3/uL (ref 0.7–4.0)
MCHC: 33.6 g/dL (ref 30.0–36.0)
MCV: 87.9 fL (ref 78.0–100.0)
Monocytes Absolute: 0.3 10*3/uL (ref 0.1–1.0)
Monocytes Relative: 2.6 % — ABNORMAL LOW (ref 3.0–12.0)
Neutro Abs: 8.1 10*3/uL — ABNORMAL HIGH (ref 1.4–7.7)
Neutrophils Relative %: 73.8 % (ref 43.0–77.0)
Platelets: 201 10*3/uL (ref 150.0–400.0)
RBC: 3.9 Mil/uL — ABNORMAL LOW (ref 4.22–5.81)
RDW: 16.4 % — ABNORMAL HIGH (ref 11.5–15.5)
WBC: 11 10*3/uL — ABNORMAL HIGH (ref 4.0–10.5)

## 2022-12-10 LAB — COMPREHENSIVE METABOLIC PANEL
ALT: 17 U/L (ref 0–53)
AST: 20 U/L (ref 0–37)
Albumin: 4 g/dL (ref 3.5–5.2)
Alkaline Phosphatase: 61 U/L (ref 39–117)
BUN: 9 mg/dL (ref 6–23)
CO2: 30 meq/L (ref 19–32)
Calcium: 9.3 mg/dL (ref 8.4–10.5)
Chloride: 104 meq/L (ref 96–112)
Creatinine, Ser: 0.86 mg/dL (ref 0.40–1.50)
GFR: 95.29 mL/min (ref >60.00)
Glucose, Bld: 93 mg/dL (ref 70–99)
Potassium: 3.7 meq/L (ref 3.5–5.1)
Sodium: 138 meq/L (ref 135–145)
Total Bilirubin: 0.8 mg/dL (ref 0.2–1.2)
Total Protein: 6.7 g/dL (ref 6.0–8.3)

## 2022-12-10 LAB — IBC + FERRITIN
Ferritin: 60 ng/mL (ref 22.0–322.0)
Iron: 129 ug/dL (ref 42–165)
Saturation Ratios: 37.5 % (ref 20.0–50.0)
TIBC: 344.4 ug/dL (ref 250.0–450.0)
Transferrin: 246 mg/dL (ref 212.0–360.0)

## 2022-12-10 LAB — LIPID PANEL
Chol/HDL Ratio: 3 {ratio} (ref 0.0–5.0)
Cholesterol, Total: 85 mg/dL — ABNORMAL LOW (ref 100–199)
HDL: 28 mg/dL — ABNORMAL LOW (ref >39)
LDL Chol Calc (NIH): 26 mg/dL (ref 0–99)
Triglycerides: 197 mg/dL — ABNORMAL HIGH (ref 0–149)
VLDL Cholesterol Cal: 31 mg/dL (ref 5–40)

## 2022-12-10 LAB — VITAMIN D 25 HYDROXY (VIT D DEFICIENCY, FRACTURES): VITD: 25.55 ng/mL — ABNORMAL LOW (ref 30.00–100.00)

## 2022-12-10 LAB — B12 AND FOLATE PANEL
Folate: >24.2 ng/mL (ref >5.9)
Vitamin B-12: 475 pg/mL (ref 211–911)

## 2022-12-10 LAB — HIGH SENSITIVITY CRP: CRP, High Sensitivity: 3.54 mg/L (ref 0.000–5.000)

## 2022-12-10 NOTE — Progress Notes (Signed)
PROCEDURE NOTE: The patient presents with symptomatic grade II  hemorrhoids, requesting rubber band ligation of his/her hemorrhoidal disease.  All risks, benefits and alternative forms of therapy were described and informed consent was obtained.   The anorectum was pre-medicated with 0.125% NTG and Recticare The decision was made to band the right anterior internal hemorrhoid, and the [facility] O'Regan System was used to perform band ligation without complication.  Digital anorectal examination was then performed to assure proper positioning of the band, and to adjust the banded tissue as required.  The patient was discharged home without pain or other issues.  Dietary and behavioral recommendations were given and along with follow-up instructions.      No complications were encountered and the patient tolerated the procedure well.   Iona Beard , MD [phone]

## 2022-12-10 NOTE — Patient Instructions (Addendum)
HEMORRHOID BANDING PROCEDURE    FOLLOW-UP CARE   The procedure you have had should have been relatively painless since the banding of the area involved does not have nerve endings and there is no pain sensation.  The rubber band cuts off the blood supply to the hemorrhoid and the band may fall off as soon as 48 hours after the banding (the band may occasionally be seen in the toilet bowl following a bowel movement). You may notice a temporary feeling of fullness in the rectum which should respond adequately to plain Tylenol or Motrin.  Following the banding, avoid strenuous exercise that evening and resume full activity the next day.  A sitz bath (soaking in a warm tub) or bidet is soothing, and can be useful for cleansing the area after bowel movements.     To avoid constipation, take two tablespoons of natural wheat bran, natural oat bran, flax, Benefiber or any over the counter fiber supplement and increase your water intake to 7-8 glasses daily.    Unless you have been prescribed anorectal medication, do not put anything inside your rectum for two weeks: No suppositories, enemas, fingers, etc.  Occasionally, you may have more bleeding than usual after the banding procedure.  This is often from the untreated hemorrhoids rather than the treated one.  Don't be concerned if there is a tablespoon or so of blood.  If there is more blood than this, lie flat with your bottom higher than your head and apply an ice pack to the area. If the bleeding does not stop within a half an hour or if you feel faint, call our office at [phone] or go to the emergency room.  Problems are not common; however, if there is a substantial amount of bleeding, severe pain, chills, fever or difficulty passing urine (very rare) or other problems, you should call us at [phone] or report to the nearest emergency room.  Do not stay seated continuously for more than 2-3 hours for a day or two after the procedure.   Tighten your buttock muscles 10-15 times every two hours and take 10-15 deep breaths every 1-2 hours.  Do not spend more than a few minutes on the toilet if you cannot empty your bowel; instead re-visit the toilet at a later time.   I appreciate the  opportunity to care for you  Thank You   Marsa Aris , MD  Your provider has requested that you go to the basement level for lab work before leaving today. Press "B" on the elevator. The lab is located at the first door on the left as you exit the elevator.

## 2022-12-13 LAB — QUANTIFERON-TB GOLD PLUS
Mitogen-NIL: 8.65 [IU]/mL
NIL: 0.1 [IU]/mL
QuantiFERON-TB Gold Plus: NEGATIVE
TB1-NIL: 0.02 [IU]/mL
TB2-NIL: 0.03 [IU]/mL

## 2022-12-18 ENCOUNTER — Telehealth: Payer: Self-pay

## 2022-12-18 ENCOUNTER — Other Ambulatory Visit: Payer: Self-pay

## 2022-12-18 MED ORDER — AMJEVITA 40 MG/0.4ML ~~LOC~~ SOAJ: 40.0000 mg | SUBCUTANEOUS | 11 refills | Status: DC

## 2022-12-18 NOTE — Telephone Encounter (Signed)
Patient contacted and advised. Encouraged him to stay in touch with [place] and call immediantly with any problems or concerns.

## 2022-12-18 NOTE — Telephone Encounter (Signed)
[employer] is changing their formulary. Humira is not covered. [employer] wants the prescription to be for bio-similar, specifically Amjevita. This change will begin 01/07/23.  [facility] will require a new prescription for this medication.  Is this okay?

## 2022-12-18 NOTE — Telephone Encounter (Signed)
Ok to send Rx for [employer] similar. Please advise patient to call with any change in symptoms. Unfortunately due to change in formulary we do not have any option

## 2023-01-19 ENCOUNTER — Other Ambulatory Visit: Payer: Self-pay | Admitting: Cardiology

## 2023-01-19 DIAGNOSIS — I5020 Unspecified systolic (congestive) heart failure: Secondary | ICD-10-CM

## 2023-01-21 ENCOUNTER — Ambulatory Visit: Payer: 59 | Admitting: Gastroenterology

## 2023-01-21 ENCOUNTER — Encounter: Payer: Self-pay | Admitting: Gastroenterology

## 2023-01-21 VITALS — BP 110/70 | HR 89 | Ht 71.0 in | Wt 181.0 lb

## 2023-01-21 DIAGNOSIS — K5000 Crohn's disease of small intestine without complications: Secondary | ICD-10-CM

## 2023-01-21 DIAGNOSIS — K641 Second degree hemorrhoids: Secondary | ICD-10-CM | POA: Diagnosis not present

## 2023-01-21 NOTE — Patient Instructions (Addendum)
 VISIT SUMMARY:  [name] visited for a follow-up regarding his Crohn's disease and hemorrhoids. He reported a recent episode of rectal bleeding, which has since resolved. His Crohn's disease remains stable after transitioning to a generic medication.  YOUR PLAN:  -HEMORRHOIDS: Hemorrhoids are swollen veins in the lower rectum and anus that can cause discomfort and bleeding. [name] experienced a recent episode of rectal bleeding likely due to a residual hemorrhoid. Today, a residual hemorrhoid was banded to prevent further issues. He was advised to avoid strenuous activity for the next few days.  -CROHN'S DISEASE: Crohn's disease is a chronic inflammatory condition of the gastrointestinal tract. Mr. Evelene Hint condition is stable on his current medication, which was recently switched to a generic version. He should continue his current treatment and have labs (CBC, CMP, CRP, fecal calprotectin) checked in March 2025 to ensure the new medication is effective. A follow-up appointment is scheduled for April 2025.  INSTRUCTIONS:  Please avoid strenuous activity for the next few days to allow the banded hemorrhoid to heal properly. Continue your current treatment for Crohn's disease and have your labs checked in March 2025. Your next follow-up appointment is scheduled for April 2025.   HEMORRHOID BANDING PROCEDURE    FOLLOW-UP CARE   The procedure you have had should have been relatively painless since the banding of the area involved does not have nerve endings and there is no pain sensation.  The rubber band cuts off the blood supply to the hemorrhoid and the band may fall off as soon as 48 hours after the banding (the band may occasionally be seen in the toilet bowl following a bowel movement). You may notice a temporary feeling of fullness in the rectum which should respond adequately to plain Tylenol  or Motrin.  Following the banding, avoid strenuous exercise that evening and resume full  activity the next day.  A sitz bath (soaking in a warm tub) or bidet is soothing, and can be useful for cleansing the area after bowel movements.     To avoid constipation, take two tablespoons of natural wheat bran, natural oat bran, flax, Benefiber or any over the counter fiber supplement and increase your water intake to 7-8 glasses daily.    Unless you have been prescribed anorectal medication, do not put anything inside your rectum for two weeks: No suppositories, enemas, fingers, etc.  Occasionally, you may have more bleeding than usual after the banding procedure.  This is often from the untreated hemorrhoids rather than the treated one.  Don't be concerned if there is a tablespoon or so of blood.  If there is more blood than this, lie flat with your bottom higher than your head and apply an ice pack to the area. If the bleeding does not stop within a half an hour or if you feel faint, call our office at [phone] or go to the emergency room.  Problems are not common; however, if there is a substantial amount of bleeding, severe pain, chills, fever or difficulty passing urine (very rare) or other problems, you should call us  at [phone] or report to the nearest emergency room.  Do not stay seated continuously for more than 2-3 hours for a day or two after the procedure.  Tighten your buttock muscles 10-15 times every two hours and take 10-15 deep breaths every 1-2 hours.  Do not spend more than a few minutes on the toilet if you cannot empty your bowel; instead re-visit the toilet at a  later time.   I appreciate the  opportunity to care for you  Thank You   Kavitha Nandigam , MD

## 2023-01-21 NOTE — Progress Notes (Signed)
Miguel Pena    [id number]    25-Aug-1965  Primary Care Physician:Elkins, Curly Rim, MD  Referring Physician: Kaleen Mask, MD [address] [place],  [place] [address]   Chief complaint: Crohn's disease, hemorrhoids  Discussed the use of AI scribe software for clinical note transcription with the patient, who gave verbal consent to proceed.  History of Present Illness   58 year old very pleasant gentleman with history of CHF, CAD on Plavix, follow-up visit for Crohn's disease. , with a known history of Crohn's disease and hemorrhoids, presents for a follow-up visit. He reports a recent episode of rectal bleeding, described as a small amount of bright red blood, which occurred a couple of weeks prior to the visit. This was a single episode and the patient has not noticed any further bleeding since. He has been applying Preparation H to manage the symptoms.  The patient's Crohn's disease is reportedly well-managed, and he has recently transitioned from Humira to a generic medication. He has not reported any changes or adverse effects since the switch.  In addition to the Crohn's disease and hemorrhoids, the patient also works in a physically demanding job, which involves exposure to cold temperatures and potential hazards. Despite these challenges, the patient does not report any work-related health issues.       GI Hx:  He is s/p CRT-D March 2019.     Relevant GI history: EGD December 06, 2018: LA grade C esophagitis and gastritis.  H. pylori negative   Colonoscopy December 06, 2018: Terminal ileum ulceration with ileitis, biopsies consistent with IBD.  Diverticulosis and internal hemorrhoids otherwise normal exam.   Capsule endoscopy October 22, 2018: Multiple aphthous ulcers and inflammation throughout small bowel, predominantly in the distal small bowel consistent with Crohn's ileitis     03/10/2017 EGD : LA grade C reflux and candidiasis esophagitis,  benign-appearing esophageal stenosis, gastritis with hemorrhage, duodenal erosions without bleeding otherwise normal.  Started on Nexium 40 twice daily.      03/10/2017  Colonoscopy : moderate diverticulosis in the sigmoid and descending colon, narrowing of the colon in association with diverticular opening, peridiverticular erythema, 1 5 mm polyp in the sigmoid colon and nonbleeding internal hemorrhoids.   Pathology showed chronic gastritis and reflux esophagitis negative for H. pylori or dysplasia.  Polyp removed was hyperplastic.  Recall colonoscopy recommended in 10 years.      CT abdomen pelvis with contrast 09/29/2018 with inflammatory changes and thickening of the distal and terminal ileum which may represent infectious enteritis or an inflammatory bowel disease.  No bowel obstruction, normal appendix.  Sigmoid diverticulosis and cholelithiasis   Outpatient Encounter Medications as of 01/21/2023  Medication Sig   Adalimumab-atto (AMJEVITA) 40 MG/0.4ML SOAJ Inject 40 mg into the skin every 14 (fourteen) days.   bisoprolol (ZEBETA) 5 MG tablet Take 1 tablet (5 mg total) by mouth daily. Please call [phone] to schedule an appointment for future refills. Thank you. 1st attempt.   dicyclomine (BENTYL) 10 MG capsule Take 10 mg by mouth as needed for spasms.   ENTRESTO 97-103 MG TAKE 1 TABLET BY MOUTH TWICE DAILY   furosemide (LASIX) 40 MG tablet Take 1 tablet (40 mg total) by mouth daily as needed (lower extremity swelling and weight gain).   Homeopathic Products (ZICAM ALLERGY RELIEF NA) Place 1 spray into the nose daily as needed (allergies).   HYDROcodone-acetaminophen (NORCO) 10-325 MG tablet Take 1 tablet by mouth 5 (five)  times daily.   Multiple Vitamin (MULTIVITAMIN WITH MINERALS) TABS tablet Take 1 tablet by mouth in the morning.   nicotine polacrilex (NICORETTE) 4 MG gum Take 4 mg by mouth as needed for smoking cessation.   omega-3 acid ethyl esters (LOVAZA) 1 g capsule Take 1 g by mouth  2 (two) times daily.   pantoprazole (PROTONIX) 40 MG tablet TAKE 1 TABLET(40 MG) BY MOUTH DAILY BEFORE BREAKFAST   potassium chloride SA (KLOR-CON M) 20 MEQ tablet Take 1 tablet (20 mEq total) by mouth daily as needed (take with as needed lasix.).   rosuvastatin (CRESTOR) 40 MG tablet TAKE 1 TABLET(40 MG) BY MOUTH DAILY   spironolactone (ALDACTONE) 25 MG tablet TAKE 1 TABLET(25 MG) BY MOUTH DAILY   No facility-administered encounter medications on file as of 01/21/2023.    Allergies as of 01/21/2023   (No Known Allergies)    Past Medical History:  Diagnosis Date   Bursitis of right shoulder    CHF (congestive heart failure) (HCC)    Hattie Perch 10/24/2016   Chronic combined systolic and diastolic heart failure (HCC) 03/16/2017   Coronary artery disease    Dilated cardiomyopathy (HCC)    Hattie Perch 10/24/2016   Dysrhythmia 1995   "VT" per patient- states had neg stress test and no problems since   Encounter for adjustment or management of automatic implantable cardioverter-defibrillator 06/17/2018   Encounter for assessment of automatic implantable cardioverter-defibrillator (AICD) 06/17/2018   Heart murmur    "when I was young"   History of kidney stones    Hypercholesterolemia    ICD: Medtronic MRI Quad CRTD (Bi-V ICD) implantation 03/16/2017 Graciela Husbands 03/16/2017   Scheduled Remote ICD check   8.7.20: No VHR episodes. No mode switches. Normal health trends. Trans-thoracic impedance trends and the OptiVol Fluid Index do no present significant abnormalities. Battery longevity is 6.5-8.3 years. RA pacing is 8.3 %, RV pacing is 99.9 %, and LV pacing is 99.9 %.  Clinic: 05/18/17   NICM (nonischemic cardiomyopathy) (HCC) 03/16/2017    Past Surgical History:  Procedure Laterality Date   BIOPSY  12/06/2018   Procedure: BIOPSY;  Surgeon: Napoleon Form, MD;  Location: [facility] ENDOSCOPY;  Service: Endoscopy;;  Egd and Colon   BIV ICD INSERTION CRT-D N/A 03/16/2017   Procedure: BIV ICD INSERTION  CRT-D;  Surgeon: Duke Salvia, MD;  Location: [facility] INVASIVE CV LAB;  Service: Cardiovascular;  Laterality: N/A;   BUBBLE STUDY  11/30/2019   Procedure: BUBBLE STUDY;  Surgeon: Tessa Lerner, DO;  Location: [facility] ENDOSCOPY;  Service: Cardiovascular;;   COLONOSCOPY WITH PROPOFOL N/A 03/10/2017   Procedure: COLONOSCOPY WITH PROPOFOL;  Surgeon: Napoleon Form, MD;  Location: [facility];  Service: Endoscopy;  Laterality: N/A;   COLONOSCOPY WITH PROPOFOL N/A 12/06/2018   Procedure: COLONOSCOPY WITH PROPOFOL;  Surgeon: Napoleon Form, MD;  Location: [facility];  Service: Endoscopy;  Laterality: N/A;   CORONARY ANGIOPLASTY WITH STENT PLACEMENT  10/28/2016   CORONARY PRESSURE/FFR STUDY N/A 10/28/2016   Procedure: INTRAVASCULAR PRESSURE WIRE/FFR STUDY;  Surgeon: Elder Negus, MD;  Location: [facility] INVASIVE CV LAB;  Service: Cardiovascular;  Laterality: N/A;   CORONARY STENT INTERVENTION N/A 10/28/2016   Procedure: CORONARY STENT INTERVENTION;  Surgeon: Elder Negus, MD;  Location: [facility] INVASIVE CV LAB;  Service: Cardiovascular;  Laterality: N/A;   ESOPHAGOGASTRODUODENOSCOPY (EGD) WITH PROPOFOL N/A 03/10/2017   Procedure: ESOPHAGOGASTRODUODENOSCOPY (EGD) WITH PROPOFOL;  Surgeon: Napoleon Form, MD;  Location: [facility];  Service: Endoscopy;  Laterality: N/A;  ESOPHAGOGASTRODUODENOSCOPY (EGD) WITH PROPOFOL N/A 12/06/2018   Procedure: ESOPHAGOGASTRODUODENOSCOPY (EGD) WITH PROPOFOL;  Surgeon: Napoleon Form, MD;  Location: [facility];  Service: Endoscopy;  Laterality: N/A;   IMPLANTABLE CARDIOVERTER DEFIBRILLATOR (ICD) GENERATOR CHANGE N/A 03/28/2021   Procedure: ICD GENERATOR CHANGE LV LEAD EXTRACTION AND LV LEAD REPLACEMENT;  Surgeon: Marinus Maw, MD;  Location: [facility] OR;  Service: Cardiovascular;  Laterality: N/A;   INGUINAL HERNIA REPAIR Right 02/16/2013   Procedure: RIGHT INGUINAL HERNIA REPAIR ;  Surgeon: Valarie Merino, MD;  Location: [facility] ORS;  Service: General;   Laterality: Right;  With MESH   INGUINAL HERNIA REPAIR Right 02/16/2013   RIGHT/LEFT HEART CATH AND CORONARY ANGIOGRAPHY N/A 10/28/2016   Procedure: RIGHT/LEFT HEART CATH AND CORONARY ANGIOGRAPHY;  Surgeon: Elder Negus, MD;  Location: [facility] INVASIVE CV LAB;  Service: Cardiovascular;  Laterality: N/A;   TEE WITHOUT CARDIOVERSION N/A 11/30/2019   Procedure: TRANSESOPHAGEAL ECHOCARDIOGRAM (TEE);  Surgeon: Tessa Lerner, DO;  Location: [facility];  Service: Cardiovascular;  Laterality: N/A;   ULTRASOUND GUIDANCE FOR VASCULAR ACCESS  10/28/2016   Procedure: Ultrasound Guidance For Vascular Access;  Surgeon: Elder Negus, MD;  Location: [facility] INVASIVE CV LAB;  Service: Cardiovascular;;    Family History  Problem Relation Age of Onset   Heart attack Father    Colon polyps Father    Colon polyps Brother    Colon cancer Neg Hx    Stomach cancer Neg Hx    Pancreatic cancer Neg Hx    Esophageal cancer Neg Hx    Liver disease Neg Hx     Social History   Socioeconomic History   Marital status: Married    Spouse name: Not on file   Number of children: 2   Years of education: Not on file   Highest education level: Not on file  Occupational History   Occupation: Transport planner    Comment: [employer]  Tobacco Use   Smoking status: Former    Current packs/day: 0.00    Average packs/day: 1 pack/day for 12.0 years (12.0 ttl pk-yrs)    Types: Cigarettes, Cigars    Start date: 02/16/1999    Quit date: 02/16/2011    Years since quitting: 11.9   Smokeless tobacco: Former    Types: Snuff   Tobacco comments:    10/28/2016 'chewed for a couple years after I quit smoking"  Vaping Use   Vaping status: Never Used  Substance and Sexual Activity   Alcohol use: No   Drug use: No   Sexual activity: Not Currently  Other Topics Concern   Not on file  Social History Narrative   Not on file   Social Drivers of Health   Financial Resource Strain: Not on file  Food Insecurity: Not  on file  Transportation Needs: Not on file  Physical Activity: Not on file  Stress: Not on file  Social Connections: Not on file  Intimate Partner Violence: Not on file      Review of systems: All other review of systems negative except as mentioned in the HPI.   Physical Exam: Vitals:   01/21/23 1127  BP: 110/70  Pulse: 89  SpO2: 97%   Body mass index is 25.24 kg/m. Gen:      No acute distress HEENT:  sclera anicteric CV: s1s2 rrr, no murmur Lungs: B/l clear. Abd:      soft, non-tender; no palpable masses, no distension Ext:    No edema Neuro: alert and oriented x 3 Psych:  normal mood and affect  Data Reviewed:  Reviewed labs, radiology imaging, old records and pertinent past GI work up     Assessment and Plan     PROCEDURE NOTE: The patient presents with symptomatic grade II  hemorrhoids, requesting rubber band ligation of his/her hemorrhoidal disease.  All risks, benefits and alternative forms of therapy were described and informed consent was obtained.  Recent episode of rectal bleeding a couple of weeks ago, likely due to residual hemorrhoid. No current symptoms. Previous successful banding. -Banded residual hemorrhoid today.  In the Left Lateral Decubitus position anoscopic examination revealed grade II hemorrhoids in the right posterior position(s).  The anorectum was pre-medicated with 0.125% RectiCare and nitroglycerin The decision was made to band the right posterior internal hemorrhoid, and the [facility] O'Regan System was used to perform band ligation without complication.  Digital anorectal examination was then performed to assure proper positioning of the band, and to adjust the banded tissue as required.  The patient was discharged home without pain or other issues.  Dietary and behavioral recommendations were given and along with follow-up instructions.      No complications were encountered and the patient tolerated the procedure well.    Crohn's  Disease Stable on Humira, transitioning to generic. -Continue current treatment. -Check labs in March 2025 to assess response to generic medication. -Schedule follow-up appointment in April 2025.       The patient was provided an opportunity to ask questions and all were answered. The patient agreed with the plan and demonstrated an understanding of the instructions.  Iona Beard , MD    CC: Kaleen Mask, *

## 2023-01-27 ENCOUNTER — Encounter: Payer: Self-pay | Admitting: Gastroenterology

## 2023-03-01 ENCOUNTER — Other Ambulatory Visit: Payer: Self-pay | Admitting: Cardiology

## 2023-03-01 DIAGNOSIS — I5020 Unspecified systolic (congestive) heart failure: Secondary | ICD-10-CM

## 2023-04-04 LAB — LAB REPORT - SCANNED: EGFR: 99

## 2023-04-06 ENCOUNTER — Other Ambulatory Visit: Payer: Self-pay | Admitting: Cardiology

## 2023-04-06 DIAGNOSIS — I5020 Unspecified systolic (congestive) heart failure: Secondary | ICD-10-CM

## 2023-04-06 DIAGNOSIS — E781 Pure hyperglyceridemia: Secondary | ICD-10-CM

## 2023-04-20 ENCOUNTER — Encounter: Payer: Self-pay | Admitting: Gastroenterology

## 2023-04-20 ENCOUNTER — Ambulatory Visit: Admitting: Gastroenterology

## 2023-04-20 VITALS — BP 118/60 | HR 56 | Ht 70.0 in | Wt 175.0 lb

## 2023-04-20 DIAGNOSIS — Z95810 Presence of automatic (implantable) cardiac defibrillator: Secondary | ICD-10-CM | POA: Diagnosis not present

## 2023-04-20 DIAGNOSIS — Z7902 Long term (current) use of antithrombotics/antiplatelets: Secondary | ICD-10-CM

## 2023-04-20 DIAGNOSIS — I2510 Atherosclerotic heart disease of native coronary artery without angina pectoris: Secondary | ICD-10-CM

## 2023-04-20 DIAGNOSIS — K5000 Crohn's disease of small intestine without complications: Secondary | ICD-10-CM

## 2023-04-20 DIAGNOSIS — Z87891 Personal history of nicotine dependence: Secondary | ICD-10-CM

## 2023-04-20 NOTE — Progress Notes (Addendum)
 04/20/2023 Miguel Pena [id number] 06-Sep-1965   HISTORY OF PRESENT ILLNESS: This is a 58 year old male is a patient of Dr. Allean Aran.  He is here today for follow-up of his Crohn's disease.  Was just seen in January 2025 by Dr. Leonia Raman.  Had hemorrhoid banding at that point as well.  He tells me that he has been doing well.  Around that time he also switched from Humira  to a biosimilar.  Says he is having 1-2 bowel movements a day that vary in form.  He says he started iron supplement last week at the recommendation of his PCP and that seemed to make his stools looser.  Vitamin B12, folate, and iron levels were normal in December.  Hgb was a little low at 11.5 grams.  He tells me he had the blood work and stool studies performed at his PCPs office last week, but I do not see those results anywhere so we will try to obtain those.  No abdominal pain.  Minimal/rare rectal bleeding.  Relevant GI history: EGD December 06, 2018: LA grade C esophagitis and gastritis.  H. pylori negative   Colonoscopy December 06, 2018: Terminal ileum ulceration with ileitis, biopsies consistent with IBD.  Diverticulosis and internal hemorrhoids otherwise normal exam.   Capsule endoscopy October 22, 2018: Multiple aphthous ulcers and inflammation throughout small bowel, predominantly in the distal small bowel consistent with Crohn's ileitis     03/10/2017 EGD : LA grade C reflux and candidiasis esophagitis, benign-appearing esophageal stenosis, gastritis with hemorrhage, duodenal erosions without bleeding otherwise normal.  Started on Nexium  40 twice daily.      03/10/2017  Colonoscopy : moderate diverticulosis in the sigmoid and descending colon, narrowing of the colon in association with diverticular opening, peridiverticular erythema, 1 5 mm polyp in the sigmoid colon and nonbleeding internal hemorrhoids.   Pathology showed chronic gastritis and reflux esophagitis negative for H. pylori or dysplasia.  Polyp removed  was hyperplastic.  Recall colonoscopy recommended in 10 years.      CT abdomen pelvis with contrast 09/29/2018 with inflammatory changes and thickening of the distal and terminal ileum which may represent infectious enteritis or an inflammatory bowel disease.  No bowel obstruction, normal appendix.  Sigmoid diverticulosis and cholelithiasis   Past Medical History:  Diagnosis Date   Bursitis of right shoulder    CHF (congestive heart failure) (HCC)    [name] 10/24/2016   Chronic combined systolic and diastolic heart failure (HCC) 03/16/2017   Coronary artery disease    Dilated cardiomyopathy (HCC)    [name] 10/24/2016   Dysrhythmia 1995   "VT" per patient- states had neg stress test and no problems since   Encounter for adjustment or management of automatic implantable cardioverter-defibrillator 06/17/2018   Encounter for assessment of automatic implantable cardioverter-defibrillator (AICD) 06/17/2018   Heart murmur    "when I was young"   History of kidney stones    Hypercholesterolemia    ICD: Medtronic MRI Quad CRTD (Bi-V ICD) implantation 03/16/2017 [name] 03/16/2017   Scheduled Remote ICD check   8.7.20: No VHR episodes. No mode switches. Normal health trends. Trans-thoracic impedance trends and the OptiVol Fluid Index do no present significant abnormalities. Battery longevity is 6.5-8.3 years. RA pacing is 8.3 %, RV pacing is 99.9 %, and LV pacing is 99.9 %.  Clinic: 05/18/17   NICM (nonischemic cardiomyopathy) (HCC) 03/16/2017   Past Surgical History:  Procedure Laterality Date   BIOPSY  12/06/2018   Procedure: BIOPSY;  Surgeon:  Nandigam, Kavitha V, MD;  Location: [facility];  Service: Endoscopy;;  Egd and Colon   BIV ICD INSERTION CRT-D N/A 03/16/2017   Procedure: BIV ICD INSERTION CRT-D;  Surgeon: [name], MD;  Location: [facility] INVASIVE CV LAB;  Service: Cardiovascular;  Laterality: N/A;   BUBBLE STUDY  11/30/2019   Procedure: BUBBLE STUDY;  Surgeon: [name], DO;   Location: [facility];  Service: Cardiovascular;;   COLONOSCOPY WITH PROPOFOL  N/A 03/10/2017   Procedure: COLONOSCOPY WITH PROPOFOL ;  Surgeon: [name], MD;  Location: [facility];  Service: Endoscopy;  Laterality: N/A;   COLONOSCOPY WITH PROPOFOL  N/A 12/06/2018   Procedure: COLONOSCOPY WITH PROPOFOL ;  Surgeon: [name], MD;  Location: [facility];  Service: Endoscopy;  Laterality: N/A;   CORONARY ANGIOPLASTY WITH STENT PLACEMENT  10/28/2016   CORONARY PRESSURE/FFR STUDY N/A 10/28/2016   Procedure: INTRAVASCULAR PRESSURE WIRE/FFR STUDY;  Surgeon: [name], MD;  Location: [facility] INVASIVE CV LAB;  Service: Cardiovascular;  Laterality: N/A;   CORONARY STENT INTERVENTION N/A 10/28/2016   Procedure: CORONARY STENT INTERVENTION;  Surgeon: [name], MD;  Location: [facility] INVASIVE CV LAB;  Service: Cardiovascular;  Laterality: N/A;   ESOPHAGOGASTRODUODENOSCOPY (EGD) WITH PROPOFOL  N/A 03/10/2017   Procedure: ESOPHAGOGASTRODUODENOSCOPY (EGD) WITH PROPOFOL ;  Surgeon: [name], MD;  Location: [facility];  Service: Endoscopy;  Laterality: N/A;   ESOPHAGOGASTRODUODENOSCOPY (EGD) WITH PROPOFOL  N/A 12/06/2018   Procedure: ESOPHAGOGASTRODUODENOSCOPY (EGD) WITH PROPOFOL ;  Surgeon: [name], MD;  Location: [facility];  Service: Endoscopy;  Laterality: N/A;   IMPLANTABLE CARDIOVERTER DEFIBRILLATOR (ICD) GENERATOR CHANGE N/A 03/28/2021   Procedure: ICD GENERATOR CHANGE LV LEAD EXTRACTION AND LV LEAD REPLACEMENT;  Surgeon: [name], MD;  Location: [facility] OR;  Service: Cardiovascular;  Laterality: N/A;   INGUINAL HERNIA REPAIR Right 02/16/2013   Procedure: RIGHT INGUINAL HERNIA REPAIR ;  Surgeon: [name], MD;  Location: [facility] ORS;  Service: General;  Laterality: Right;  With MESH   INGUINAL HERNIA REPAIR Right 02/16/2013   RIGHT/LEFT HEART CATH AND CORONARY ANGIOGRAPHY N/A 10/28/2016   Procedure: RIGHT/LEFT HEART CATH AND CORONARY ANGIOGRAPHY;   Surgeon: [name], MD;  Location: [facility]VAS[facility] CV LAB;  Service: Cardiovascular;  Laterality: N/A;   TEE WITHOUT CARDIOVERSION N/A 11/30/2019   Procedure: TRANSESOPHAGEAL ECHOCARDIOGRAM (TEE);  Surgeon: [name], DO;  Location: [facility];  Service: Cardiovascular;  Laterality: N/A;   ULTRASOUND GUIDANCE FOR VASCULAR ACCESS  10/28/2016   Procedure: Ultrasound Guidance For Vascular Access;  Surgeon: [name], MD;  Location: [facility] INVASIVE CV LAB;  Service: Cardiovascular;;    reports that he quit smoking about 12 years ago. His smoking use included cigarettes and cigars. He started smoking about 24 years ago. He has a 12 pack-year smoking history. He has quit using smokeless tobacco.  His smokeless tobacco use included snuff. He reports that he does not drink alcohol and does not use drugs. family history includes Colon polyps in his brother and father; Heart attack in his father. No Known Allergies    Outpatient Encounter Medications as of 04/20/2023  Medication Sig   Adalimumab -atto (AMJEVITA ) 40 MG/0.4ML SOAJ Inject 40 mg into the skin every 14 (fourteen) days.   bisoprolol  (ZEBETA ) 5 MG tablet TAKE 1 TABLET(5 MG) BY MOUTH DAILY   dicyclomine  (BENTYL ) 10 MG capsule Take 10 mg by mouth as needed for spasms.   ENTRESTO  97-103 MG TAKE 1 TABLET BY MOUTH TWICE DAILY   FEROSUL 325 (65 Fe) MG tablet Take 325  mg by mouth daily.   furosemide  (LASIX ) 40 MG tablet Take 1 tablet (40 mg total) by mouth daily as needed (lower extremity swelling and weight gain).   Homeopathic Products (ZICAM ALLERGY RELIEF NA) Place 1 spray into the nose daily as needed (allergies).   HYDROcodone -acetaminophen  (NORCO) 10-325 MG tablet Take 1 tablet by mouth 5 (five) times daily.   Multiple Vitamin (MULTIVITAMIN WITH MINERALS) TABS tablet Take 1 tablet by mouth in the morning.   nicotine  polacrilex (NICORETTE ) 4 MG gum Take 4 mg by mouth as needed for smoking cessation.   omega-3 acid ethyl esters  (LOVAZA ) 1 g capsule Take 1 g by mouth 2 (two) times daily.   pantoprazole  (PROTONIX ) 40 MG tablet TAKE 1 TABLET(40 MG) BY MOUTH DAILY BEFORE BREAKFAST   potassium chloride  SA (KLOR-CON  M) 20 MEQ tablet Take 1 tablet (20 mEq total) by mouth daily as needed (take with as needed lasix .).   rosuvastatin  (CRESTOR ) 40 MG tablet TAKE 1 TABLET(40 MG) BY MOUTH DAILY   spironolactone  (ALDACTONE ) 25 MG tablet TAKE 1 TABLET(25 MG) BY MOUTH DAILY   No facility-administered encounter medications on file as of 04/20/2023.    REVIEW OF SYSTEMS  : All other systems reviewed and negative except where noted in the History of Present Illness.   PHYSICAL EXAM: BP 118/60   Pulse (!) 56   Ht 5\' 10"  (1.778 m)   Wt 175 lb (79.4 kg)   BMI 25.11 kg/m  General: Well developed white male in no acute distress Head: Normocephalic and atraumatic Eyes:  Sclerae anicteric, conjunctiva pink. Ears: Normal auditory acuity. Lungs:  CTAB.  No W/R/R. Heart: Regular rate and rhythm; no M/R/G. Abdomen: Soft, non-distended.  BS present.  Non-tender. Musculoskeletal: Symmetrical with no gross deformities  Skin: No lesions on visible extremities Extremities: No edema.  Neurological: Alert oriented x 4, grossly non-focal Psychological:  Alert and cooperative. Normal mood and affect  ASSESSMENT AND PLAN: *Crohn's disease: Stable on Humira  biosimilar.  Reports he is doing well.  Had labs and fecal calprotectin performed at his PCPs office last week.  Will try to obtain those results.  Pending results likely will plan for follow-up in 6 months. *Coronary artery disease/heart disease on Plavix  and has defibrillator.  **Addendum: Received labs from his PCPs office.  Most notably fecal calprotectin is elevated 1750.  CMP is normal.  White blood cell count is actually elevated at 17.4 K and hemoglobin is 11.5 g.  Iron levels, B12 levels were normal.  Lab results sent for scanning.  CC:  Candiss Chamorro, *

## 2023-04-20 NOTE — Patient Instructions (Signed)
 Follow up in 6 months pending lab results.   _______________________________________________________  If your blood pressure at your visit was 140/90 or greater, please contact your primary care physician to follow up on this.  _______________________________________________________  If you are age 59 or older, your body mass index should be between 23-30. Your Body mass index is 25.11 kg/m. If this is out of the aforementioned range listed, please consider follow up with your Primary Care Provider.  If you are age 65 or younger, your body mass index should be between 19-25. Your Body mass index is 25.11 kg/m. If this is out of the aformentioned range listed, please consider follow up with your Primary Care Provider.   ________________________________________________________  The [place] [facility] providers would like to encourage you to use MYCHART to communicate with providers for non-urgent requests or questions.  Due to long hold times on the telephone, sending your provider a message by [facility] may be a faster and more efficient way to get a response.  Please allow 48 business hours for a response.  Please remember that this is for non-urgent requests.  _______________________________________________________

## 2023-05-05 ENCOUNTER — Telehealth: Payer: Self-pay

## 2023-05-05 ENCOUNTER — Other Ambulatory Visit: Payer: Self-pay

## 2023-05-05 ENCOUNTER — Encounter: Payer: Self-pay | Admitting: Gastroenterology

## 2023-05-05 DIAGNOSIS — R195 Other fecal abnormalities: Secondary | ICD-10-CM

## 2023-05-05 DIAGNOSIS — K5000 Crohn's disease of small intestine without complications: Secondary | ICD-10-CM

## 2023-05-05 MED ORDER — NA SULFATE-K SULFATE-MG SULF 17.5-3.13-1.6 GM/177ML PO SOLN: ORAL | 0 refills | Status: DC

## 2023-05-05 NOTE — Telephone Encounter (Signed)
 Patient contacted and scheduled for 05/18/23. He will come to the office and pick up the written instructions.

## 2023-05-05 NOTE — Telephone Encounter (Signed)
 Patient returning call. Requesting to speak with a nurse. Please advise.

## 2023-05-05 NOTE — Telephone Encounter (Signed)
 PV 07/07/2023 11am; colonoscopy with Dr. Leonia Raman 07/21/2023 930am

## 2023-05-05 NOTE — Telephone Encounter (Signed)
 FYI Miguel Pena The pt needed to make an appt for previsit and colon with Dr Leonia Raman.  He was transferred to the schedulers.

## 2023-05-05 NOTE — Telephone Encounter (Signed)
-----   Message from Hensley D. Zehr sent at 05/05/2023 11:00 AM EDT ----- Please let the patient know that we received his lab results from his PCPs office.  His fecal calprotectin level is very high at 1750.  I discussed with Dr. Nandigam and she believes he needs another colonoscopy to evaluate for acute inflammation.  Not sure if the Humira  biosimilar is as effective as we need.  Please schedule for next available that works for him.  Thank you,  Jess ----- Message ----- From: Nandigam, Kavitha V, MD Sent: 05/05/2023   9:42 AM EDT To: Martina Sledge Zehr, PA-C  Agree with colonoscopy, he has been putting it off, he was switched to generic not sure if its as effective. Can you please get him in for next available ? Thanks ----- Message ----- From: Cortez Dines, PA-C Sent: 04/30/2023   1:15 PM EDT To: Kavitha Nandigam V, MD  Hello.  I saw this patient the other week.  He told me that he is doing well on the Humira  biosimilar, clinically does not seem to have symptoms, but fecal calprotectin performed at the end of March at his PCPs office was very high at 1750.  I think he probably needs another colonoscopy to evaluate.  Let me know your thoughts.  Thank you,   Jess

## 2023-05-05 NOTE — Telephone Encounter (Signed)
 I spoke with the pt and made him aware of the results  He states he will speak with his wife and call back to set up the colon.

## 2023-05-18 ENCOUNTER — Other Ambulatory Visit: Payer: Self-pay

## 2023-05-18 ENCOUNTER — Encounter: Payer: Self-pay | Admitting: Gastroenterology

## 2023-05-18 ENCOUNTER — Telehealth: Payer: Self-pay

## 2023-05-18 ENCOUNTER — Ambulatory Visit (AMBULATORY_SURGERY_CENTER): Admitting: Gastroenterology

## 2023-05-18 ENCOUNTER — Other Ambulatory Visit (INDEPENDENT_AMBULATORY_CARE_PROVIDER_SITE_OTHER)

## 2023-05-18 VITALS — BP 114/60 | HR 68 | Temp 98.1°F | Resp 14 | Ht 70.0 in | Wt 175.0 lb

## 2023-05-18 DIAGNOSIS — K5730 Diverticulosis of large intestine without perforation or abscess without bleeding: Secondary | ICD-10-CM

## 2023-05-18 DIAGNOSIS — K644 Residual hemorrhoidal skin tags: Secondary | ICD-10-CM

## 2023-05-18 DIAGNOSIS — K6389 Other specified diseases of intestine: Secondary | ICD-10-CM

## 2023-05-18 DIAGNOSIS — K50818 Crohn's disease of both small and large intestine with other complication: Secondary | ICD-10-CM

## 2023-05-18 DIAGNOSIS — K639 Disease of intestine, unspecified: Secondary | ICD-10-CM | POA: Diagnosis not present

## 2023-05-18 DIAGNOSIS — Z1211 Encounter for screening for malignant neoplasm of colon: Secondary | ICD-10-CM | POA: Diagnosis present

## 2023-05-18 DIAGNOSIS — K648 Other hemorrhoids: Secondary | ICD-10-CM

## 2023-05-18 DIAGNOSIS — R1013 Epigastric pain: Secondary | ICD-10-CM

## 2023-05-18 LAB — CBC WITH DIFFERENTIAL/PLATELET
Basophils Absolute: 0.1 10*3/uL (ref 0.0–0.1)
Basophils Relative: 0.5 % (ref 0.0–3.0)
Eosinophils Absolute: 0.2 10*3/uL (ref 0.0–0.7)
Eosinophils Relative: 1.3 % (ref 0.0–5.0)
HCT: 36.2 % — ABNORMAL LOW (ref 39.0–52.0)
Hemoglobin: 11.7 g/dL — ABNORMAL LOW (ref 13.0–17.0)
Lymphocytes Relative: 9.9 % — ABNORMAL LOW (ref 12.0–46.0)
Lymphs Abs: 1.8 10*3/uL (ref 0.7–4.0)
MCHC: 32.4 g/dL (ref 30.0–36.0)
MCV: 92 fl (ref 78.0–100.0)
Monocytes Absolute: 0.1 10*3/uL (ref 0.1–1.0)
Monocytes Relative: 0.3 % — ABNORMAL LOW (ref 3.0–12.0)
Neutro Abs: 16.1 10*3/uL — ABNORMAL HIGH (ref 1.4–7.7)
Neutrophils Relative %: 88 % — ABNORMAL HIGH (ref 43.0–77.0)
Platelets: 264 10*3/uL (ref 150.0–400.0)
RBC: 3.93 Mil/uL — ABNORMAL LOW (ref 4.22–5.81)
RDW: 15.3 % (ref 11.5–15.5)
WBC: 18.3 10*3/uL (ref 4.0–10.5)

## 2023-05-18 LAB — COMPREHENSIVE METABOLIC PANEL WITH GFR
ALT: 12 U/L (ref 0–53)
AST: 19 U/L (ref 0–37)
Albumin: 3.9 g/dL (ref 3.5–5.2)
Alkaline Phosphatase: 81 U/L (ref 39–117)
BUN: 12 mg/dL (ref 6–23)
CO2: 26 meq/L (ref 19–32)
Calcium: 9.2 mg/dL (ref 8.4–10.5)
Chloride: 104 meq/L (ref 96–112)
Creatinine, Ser: 0.94 mg/dL (ref 0.40–1.50)
GFR: 88.94 mL/min (ref >60.00)
Glucose, Bld: 101 mg/dL — ABNORMAL HIGH (ref 70–99)
Potassium: 4.4 meq/L (ref 3.5–5.1)
Sodium: 140 meq/L (ref 135–145)
Total Bilirubin: 1.3 mg/dL — ABNORMAL HIGH (ref 0.2–1.2)
Total Protein: 7.3 g/dL (ref 6.0–8.3)

## 2023-05-18 LAB — HIGH SENSITIVITY CRP: CRP, High Sensitivity: 22.8 mg/L — ABNORMAL HIGH (ref 0.000–5.000)

## 2023-05-18 MED ORDER — SODIUM CHLORIDE 0.9 % IV SOLN: 500.0000 mL | INTRAVENOUS | Status: DC

## 2023-05-18 MED ORDER — PREDNISONE 5 MG PO TABS: ORAL_TABLET | ORAL | 0 refills | Status: AC

## 2023-05-18 NOTE — Progress Notes (Signed)
 Called to room to assist during endoscopic procedure.  Patient ID and intended procedure confirmed with present staff. Received instructions for my participation in the procedure from the performing physician.

## 2023-05-18 NOTE — Patient Instructions (Signed)
Handouts given on hemorrhoids and diverticulosis.  YOU HAD AN ENDOSCOPIC PROCEDURE TODAY AT THE [facility]:   Refer to the procedure report that was given to you for any specific questions about what was found during the examination.  If the procedure report does not answer your questions, please call your gastroenterologist to clarify.  If you requested that your care partner not be given the details of your procedure findings, then the procedure report has been included in a sealed envelope for you to review at your convenience later.  YOU SHOULD EXPECT: Some feelings of bloating in the abdomen. Passage of more gas than usual.  Walking can help get rid of the air that was put into your GI tract during the procedure and reduce the bloating. If you had a lower endoscopy (such as a colonoscopy or flexible sigmoidoscopy) you may notice spotting of blood in your stool or on the toilet paper. If you underwent a bowel prep for your procedure, you may not have a normal bowel movement for a few days.  Please Note:  You might notice some irritation and congestion in your nose or some drainage.  This is from the oxygen used during your procedure.  There is no need for concern and it should clear up in a day or so.  SYMPTOMS TO REPORT IMMEDIATELY:  Following lower endoscopy (colonoscopy or flexible sigmoidoscopy):  Excessive amounts of blood in the stool  Significant tenderness or worsening of abdominal pains  Swelling of the abdomen that is new, acute  Fever of 100F or higher  For urgent or emergent issues, a gastroenterologist can be reached at any hour by calling [phone]. Do not use MyChart messaging for urgent concerns.    DIET:  We do recommend a small meal at first, but then you may proceed to your regular diet.  Drink plenty of fluids but you should avoid alcoholic beverages for 24 hours.  ACTIVITY:  You should plan to take it easy for the rest of today and you should NOT  DRIVE or use heavy machinery until tomorrow (because of the sedation medicines used during the test).    FOLLOW UP: Our staff will call the number listed on your records the next business day following your procedure.  We will call around 7:15- 8:00 am to check on you and address any questions or concerns that you may have regarding the information given to you following your procedure. If we do not reach you, we will leave a message.     If any biopsies were taken you will be contacted by phone or by letter within the next 1-3 weeks.  Please call us at [phone] if you have not heard about the biopsies in 3 weeks.    SIGNATURES/CONFIDENTIALITY: You and/or your care partner have signed paperwork which will be entered into your electronic medical record.  These signatures attest to the fact that that the information above on your After Visit Summary has been reviewed and is understood.  Full responsibility of the confidentiality of this discharge information lies with you and/or your care-partner. 

## 2023-05-18 NOTE — Telephone Encounter (Signed)
 He will start with induction dose of Rinvoq 45 mg daily for 12 weeks and then to 30 mg daily for maintenance for Crohn's disease.  Thank

## 2023-05-18 NOTE — Op Note (Signed)
 [facility] Patient Name: Miguel Pena Procedure Date: 05/18/2023 10:01 AM MRN: [id number] Endoscopist: [name] , MD, [id number] Age: 58 Referring MD:  Date of Birth: September 30, 1965 Gender: Male Account #: [id number] Procedure:                Colonoscopy Medicines:                Monitored Anesthesia Care Procedure:                Pre-Anesthesia Assessment:                           - Prior to the procedure, a History and Physical                            was performed, and patient medications and                            allergies were reviewed. The patient's tolerance of                            previous anesthesia was also reviewed. The risks                            and benefits of the procedure and the sedation                            options and risks were discussed with the patient.                            All questions were answered, and informed consent                            was obtained. Prior Anticoagulants: The patient has                            taken no anticoagulant or antiplatelet agents. ASA                            Grade Assessment: III - A patient with severe                            systemic disease. After reviewing the risks and                            benefits, the patient was deemed in satisfactory                            condition to undergo the procedure.                           After obtaining informed consent, the colonoscope                            was passed under direct vision. Throughout the  procedure, the patient's blood pressure, pulse, and                            oxygen saturations were monitored continuously. The                            Olympus Scope [id number] was introduced through the                            anus and advanced to the the cecum, identified by                            appendiceal orifice and ileocecal valve. The                             colonoscopy was performed without difficulty. The                            patient tolerated the procedure well. The quality                            of the bowel preparation was good. The ileocecal                            valve, appendiceal orifice, and rectum were                            photographed. Scope In: 10:12:35 AM Scope Out: 10:35:34 AM Scope Withdrawal Time: 0 hours 16 minutes 32 seconds  Total Procedure Duration: 0 hours 22 minutes 59 seconds  Findings:                 The perianal and digital rectal examinations were                            normal.                           The Simple Endoscopic Score for Crohn's Disease was                            determined based on the endoscopic appearance of                            the mucosa in the following segments:                           - Ileum: Findings include no ulcers present, less                            than 50% of surfaces affected and no narrowings.                            Segment score: 1.                           -  Right Colon: Findings include no ulcers present,                            no ulcerated surfaces, no affected surfaces and no                            narrowings. Segment score: 0.                           - Transverse Colon: Findings include no ulcers                            present, no ulcerated surfaces, no affected                            surfaces and no narrowings. Segment score: 0.                           - Left Colon: Findings include no ulcers present,                            no ulcerated surfaces, no affected surfaces and no                            narrowings. Segment score: 0.                           - Rectum: Findings include less than 10% ulcerated                            surfaces, less than 50% of surfaces affected and no                            narrowings. Segment score: 2.                           - Total SES-CD aggregate score: 3. Biopsies  were                            taken with a cold forceps for histology.                           A few small-mouthed diverticula were found in the                            sigmoid colon.                           Non-bleeding external and internal hemorrhoids were                            found during retroflexion. The hemorrhoids were                            small. Complications:  No immediate complications. Estimated Blood Loss:     Estimated blood loss was minimal. Impression:               - Simple Endoscopic Score for Crohn's Disease: 3,                            mucosal inflammatory changes secondary to Crohn's                            disease, with ileitis and colitis. Biopsied.                           - Diverticulosis in the sigmoid colon.                           - Non-bleeding external and internal hemorrhoids. Recommendation:           - Patient has a contact number available for                            emergencies. The signs and symptoms of potential                            delayed complications were discussed with the                            patient. Return to normal activities tomorrow.                            Written discharge instructions were provided to the                            patient.                           - Resume previous diet.                           - Continue present medications.                           - Await pathology results.                           - Repeat colonoscopy in 3 - 5 years for                            surveillance.                           - Check CBC, CMP, CRP, Adalumimab Ab and drug trough                           - Prednisone  taper 20mg  daily X 5 days, 15mg  X 5,  10mg  X 5 days and then 5mg  X 5 days and stop                           - Switch to Rinvoq, loss of clinical response to                            adalumimab, pending insurance approval                            - Follow up in office visit with Dr Leonia Raman or APP                            in 2-3 weeks [name], MD 05/18/2023 10:46:51 AM This report has been signed electronically.

## 2023-05-18 NOTE — Telephone Encounter (Signed)
 Dr Nandigam does he need induction dose or maintenance?

## 2023-05-18 NOTE — Progress Notes (Signed)
 Miguel Pena History and Physical   Primary Care Physician:  Miguel Chamorro, MD   Reason for Procedure:  History of crohn's disease  Plan:    Surveillance colonoscopy with possible interventions as needed     HPI: Miguel Pena is a very pleasant 58 y.o. male here for surveillance colonoscopy to assess disease activity and surveillance Denies any nausea, vomiting, abdominal pain, melena or bright red blood per rectum  The risks and benefits as well as alternatives of endoscopic procedure(s) have been discussed and reviewed. All questions answered. The patient agrees to proceed.    Past Medical History:  Diagnosis Date   Bursitis of right shoulder    CHF (congestive heart failure) (HCC)    Miguel Pena 10/24/2016   Chronic combined systolic and diastolic heart failure (HCC) 03/16/2017   Coronary artery disease    Dilated cardiomyopathy (HCC)    Miguel Pena 10/24/2016   Dysrhythmia 1995   "VT" per patient- states had neg stress test and no problems since   Encounter for adjustment or management of automatic implantable cardioverter-defibrillator 06/17/2018   Encounter for assessment of automatic implantable cardioverter-defibrillator (AICD) 06/17/2018   Heart murmur    "when I was young"   History of kidney stones    Hypercholesterolemia    ICD: Medtronic MRI Quad CRTD (Bi-Pena ICD) implantation 03/16/2017 Miguel Pena 03/16/2017   Scheduled Remote ICD check   8.7.20: No VHR episodes. No mode switches. Normal health trends. Trans-thoracic impedance trends and the OptiVol Fluid Index do no present significant abnormalities. Battery longevity is 6.5-8.3 years. RA pacing is 8.3 %, RV pacing is 99.9 %, and LV pacing is 99.9 %.  Clinic: 05/18/17   NICM (nonischemic cardiomyopathy) (HCC) 03/16/2017    Past Surgical History:  Procedure Laterality Date   BIOPSY  12/06/2018   Procedure: BIOPSY;  Surgeon: Miguel Dandy, MD;  Location: [facility];  Service: Endoscopy;;  Egd  and Colon   BIV ICD INSERTION CRT-D N/A 03/16/2017   Procedure: BIV ICD INSERTION CRT-D;  Surgeon: Miguel Goodwill, MD;  Location: [facility] INVASIVE CV LAB;  Service: Cardiovascular;  Laterality: N/A;   BUBBLE STUDY  11/30/2019   Procedure: BUBBLE STUDY;  Surgeon: Miguel Bertrand, DO;  Location: [facility];  Service: Cardiovascular;;   COLONOSCOPY WITH PROPOFOL  N/A 03/10/2017   Procedure: COLONOSCOPY WITH PROPOFOL ;  Surgeon: Miguel Dandy, MD;  Location: [facility];  Service: Endoscopy;  Laterality: N/A;   COLONOSCOPY WITH PROPOFOL  N/A 12/06/2018   Procedure: COLONOSCOPY WITH PROPOFOL ;  Surgeon: Miguel Dandy, MD;  Location: [facility];  Service: Endoscopy;  Laterality: N/A;   CORONARY ANGIOPLASTY WITH STENT PLACEMENT  10/28/2016   CORONARY PRESSURE/FFR STUDY N/A 10/28/2016   Procedure: INTRAVASCULAR PRESSURE WIRE/FFR STUDY;  Surgeon: Miguel Das, MD;  Location: [facility] INVASIVE CV LAB;  Service: Cardiovascular;  Laterality: N/A;   CORONARY STENT INTERVENTION N/A 10/28/2016   Procedure: CORONARY STENT INTERVENTION;  Surgeon: Miguel Das, MD;  Location: [facility] CV LAB;  Service: Cardiovascular;  Laterality: N/A;   ESOPHAGOGASTRODUODENOSCOPY (EGD) WITH PROPOFOL  N/A 03/10/2017   Procedure: ESOPHAGOGASTRODUODENOSCOPY (EGD) WITH PROPOFOL ;  Surgeon: Miguel Dandy, MD;  Location: [facility];  Service: Endoscopy;  Laterality: N/A;   ESOPHAGOGASTRODUODENOSCOPY (EGD) WITH PROPOFOL  N/A 12/06/2018   Procedure: ESOPHAGOGASTRODUODENOSCOPY (EGD) WITH PROPOFOL ;  Surgeon: Miguel Dandy, MD;  Location: [facility];  Service: Endoscopy;  Laterality: N/A;   IMPLANTABLE CARDIOVERTER DEFIBRILLATOR (ICD) GENERATOR CHANGE N/A 03/28/2021   Procedure: ICD GENERATOR CHANGE LV LEAD EXTRACTION AND LV  LEAD REPLACEMENT;  Surgeon: Miguel Fall, MD;  Location: [facility] OR;  Service: Cardiovascular;  Laterality: N/A;   INGUINAL HERNIA REPAIR Right 02/16/2013   Procedure: RIGHT INGUINAL HERNIA  REPAIR ;  Surgeon: Miguel Bollard, MD;  Location: [facility] ORS;  Service: General;  Laterality: Right;  With MESH   INGUINAL HERNIA REPAIR Right 02/16/2013   RIGHT/LEFT HEART CATH AND CORONARY ANGIOGRAPHY N/A 10/28/2016   Procedure: RIGHT/LEFT HEART CATH AND CORONARY ANGIOGRAPHY;  Surgeon: Miguel Das, MD;  [facility]: [facility];  Service: Cardiovascular;  Laterality: N/A;   TEE WITHOUT CARDIOVERSION N/A 11/30/2019   Procedure: TRANSESOPHAGEAL ECHOCARDIOGRAM (TEE);  Surgeon: Miguel Bertrand, DO;  Location: [facility];  Service: Cardiovascular;  Laterality: N/A;   ULTRASOUND GUIDANCE FOR VASCULAR ACCESS  10/28/2016   Procedure: Ultrasound Guidance For Vascular Access;  Surgeon: Miguel Das, MD;  Location: [facility];  Service: Cardiovascular;;    Prior to Admission medications   Medication Sig Start Date End Date Taking? Authorizing Provider  bisoprolol  (ZEBETA ) 5 MG tablet TAKE 1 TABLET(5 MG) BY MOUTH DAILY 04/06/23  Yes Patwardhan, Manish J, MD  ENTRESTO  97-103 MG TAKE 1 TABLET BY MOUTH TWICE DAILY 11/19/22  Yes Patwardhan, Manish J, MD  HYDROcodone -acetaminophen  (NORCO) 10-325 MG tablet Take 1 tablet by mouth 5 (five) times daily. 09/11/16  Yes [provider]  Multiple Vitamin (MULTIVITAMIN WITH MINERALS) TABS tablet Take 1 tablet by mouth in the morning.   Yes [provider]  Na Sulfate-K Sulfate-Mg Sulfate concentrate (SUPREP) 17.5-3.13-1.6 GM/177ML SOLN Follow the instructions from Dr Miguel Pena 05/05/23  Yes Miguel Wadlow V, MD  nicotine  polacrilex (NICORETTE ) 4 MG gum Take 4 mg by mouth as needed for smoking cessation.   Yes [provider]  omega-3 acid ethyl esters (LOVAZA ) 1 g capsule Take 1 g by mouth 2 (two) times daily.   Yes [provider]  pantoprazole  (PROTONIX ) 40 MG tablet TAKE 1 TABLET(40 MG) BY MOUTH DAILY BEFORE BREAKFAST 11/14/22  Yes Miguel Ryland V, MD  rosuvastatin  (CRESTOR ) 40 MG tablet TAKE 1 TABLET(40  MG) BY MOUTH DAILY 04/06/23  Yes Patwardhan, Manish J, MD  spironolactone  (ALDACTONE ) 25 MG tablet TAKE 1 TABLET(25 MG) BY MOUTH DAILY 01/19/23  Yes Patwardhan, Manish J, MD  Adalimumab -atto (AMJEVITA ) 40 MG/0.4ML SOAJ Inject 40 mg into the skin every 14 (fourteen) days. 12/18/22   Clarrisa Kaylor V, MD  dicyclomine  (BENTYL ) 10 MG capsule Take 10 mg by mouth as needed for spasms.    [provider]  FEROSUL 325 (65 Fe) MG tablet Take 325 mg by mouth daily. 04/16/23   [provider]  furosemide  (LASIX ) 40 MG tablet Take 1 tablet (40 mg total) by mouth daily as needed (lower extremity swelling and weight gain). 12/09/22   Patwardhan, Kaye Parsons, MD  Homeopathic Products [facility] ALLERGY RELIEF NA) Place 1 spray into the nose daily as needed (allergies).    [provider]  potassium chloride  SA (KLOR-CON  M) 20 MEQ tablet Take 1 tablet (20 mEq total) by mouth daily as needed (take with as needed lasix .). Patient not taking: Reported on 05/18/2023 12/09/22   Miguel Das, MD    Current Outpatient Medications  Medication Sig Dispense Refill   bisoprolol  (ZEBETA ) 5 MG tablet TAKE 1 TABLET(5 MG) BY MOUTH DAILY 90 tablet 2   ENTRESTO  97-103 MG TAKE 1 TABLET BY MOUTH TWICE DAILY 180 tablet 3   HYDROcodone -acetaminophen  (NORCO) 10-325 MG tablet Take 1 tablet by mouth 5 (five)  times daily.     Multiple Vitamin (MULTIVITAMIN WITH MINERALS) TABS tablet Take 1 tablet by mouth in the morning.     Na Sulfate-K Sulfate-Mg Sulfate concentrate (SUPREP) 17.5-3.13-1.6 GM/177ML SOLN Follow the instructions from Dr Leonia Raman 354 mL 0   nicotine  polacrilex (NICORETTE ) 4 MG gum Take 4 mg by mouth as needed for smoking cessation.     omega-3 acid ethyl esters (LOVAZA ) 1 g capsule Take 1 g by mouth 2 (two) times daily.     pantoprazole  (PROTONIX ) 40 MG tablet TAKE 1 TABLET(40 MG) BY MOUTH DAILY BEFORE BREAKFAST 90 tablet 3   rosuvastatin  (CRESTOR ) 40 MG tablet TAKE 1 TABLET(40 MG) BY MOUTH DAILY  90 tablet 2   spironolactone  (ALDACTONE ) 25 MG tablet TAKE 1 TABLET(25 MG) BY MOUTH DAILY 90 tablet 3   Adalimumab -atto (AMJEVITA ) 40 MG/0.4ML SOAJ Inject 40 mg into the skin every 14 (fourteen) days. 0.8 mL 11   dicyclomine  (BENTYL ) 10 MG capsule Take 10 mg by mouth as needed for spasms.     FEROSUL 325 (65 Fe) MG tablet Take 325 mg by mouth daily.     furosemide  (LASIX ) 40 MG tablet Take 1 tablet (40 mg total) by mouth daily as needed (lower extremity swelling and weight gain). 90 tablet 1   Homeopathic Products (ZICAM ALLERGY RELIEF NA) Place 1 spray into the nose daily as needed (allergies).     potassium chloride  SA (KLOR-CON  M) 20 MEQ tablet Take 1 tablet (20 mEq total) by mouth daily as needed (take with as needed lasix .). (Patient not taking: Reported on 05/18/2023) 90 tablet 1   Current Facility-Administered Medications  Medication Dose Route Frequency Provider Last Rate Last Admin   0.9 %  sodium chloride  infusion  500 mL Intravenous Continuous Calton Harshfield V, MD        Allergies as of 05/18/2023   (No Known Allergies)    Family History  Problem Relation Age of Onset   Heart attack Father    Colon polyps Father    Colon polyps Brother    Colon cancer Neg Hx    Stomach cancer Neg Hx    Pancreatic cancer Neg Hx    Esophageal cancer Neg Hx    Liver disease Neg Hx    Rectal cancer Neg Hx     Social History   Socioeconomic History   Marital status: Married    Spouse name: Not on file   Number of children: 2   Years of education: Not on file   Highest education level: Not on file  Occupational History   Occupation: Transport planner    Comment: [employer]  Tobacco Use   Smoking status: Former    Current packs/day: 0.00    Average packs/day: 1 pack/day for 12.0 years (12.0 ttl pk-yrs)    Types: Cigarettes, Cigars    Start date: 02/16/1999    Quit date: 02/16/2011    Years since quitting: 12.2   Smokeless tobacco: Former    Types: Snuff   Tobacco comments:     10/28/2016 'chewed for a couple years after I quit smoking"; 5-6 x/day as of 05/18/23  Vaping Use   Vaping status: Never Used  Substance and Sexual Activity   Alcohol use: No   Drug use: No   Sexual activity: Not Currently  Other Topics Concern   Not on file  Social History Narrative   Not on file   Social Drivers of Health   Financial Resource Strain: Not on file  Food  Insecurity: Not on file  Transportation Needs: Not on file  Physical Activity: Not on file  Stress: Not on file  Social Connections: Not on file  Intimate Partner Violence: Not on file    Review of Systems:  All other review of systems negative except as mentioned in the HPI.  Physical Exam: Vital signs in last 24 hours: BP 122/65   Pulse (!) 59   Temp 98.1 F (36.7 C) (Skin)   Ht 5\' 10"  (1.778 m)   Wt 175 lb (79.4 kg)   SpO2 96%   BMI 25.11 kg/m  General:   Alert, NAD Lungs:  Clear .   Heart:  Regular rate and rhythm Abdomen:  Soft, nontender and nondistended. Neuro/Psych:  Alert and cooperative. Normal mood and affect. A and O x 3  Reviewed labs, radiology imaging, old records and pertinent past GI work up  Patient is appropriate for planned procedure(s) and anesthesia in an ambulatory setting   K. Veena Taft Worthing , MD [phone]

## 2023-05-18 NOTE — Telephone Encounter (Signed)
 ok

## 2023-05-18 NOTE — Progress Notes (Signed)
 Patient states there have been no changes to medical or surgical history since time of pre-visit.

## 2023-05-18 NOTE — Progress Notes (Signed)
 Report given to PACU, vss

## 2023-05-18 NOTE — Telephone Encounter (Signed)
 See alternate note

## 2023-05-18 NOTE — Telephone Encounter (Signed)
 Received a call from the lab, patient has a critical WBC count of 18.3.

## 2023-05-19 ENCOUNTER — Other Ambulatory Visit: Payer: Self-pay

## 2023-05-19 ENCOUNTER — Telehealth: Payer: Self-pay

## 2023-05-19 ENCOUNTER — Ambulatory Visit: Payer: Self-pay

## 2023-05-19 MED ORDER — RINVOQ 30 MG PO TB24: 30.0000 mg | ORAL_TABLET | Freq: Every day | ORAL | 6 refills | Status: DC

## 2023-05-19 MED ORDER — RINVOQ 45 MG PO TB24: 45.0000 mg | ORAL_TABLET | Freq: Every day | ORAL | 0 refills | Status: DC

## 2023-05-19 NOTE — Telephone Encounter (Signed)
  Follow up Call-     05/18/2023    9:30 AM  Call back number  Post procedure Call Back phone  # [phone]  Permission to leave phone message Yes     Patient questions:  Do you have a fever, pain , or abdominal swelling? No. Pain Score  0 *  Have you tolerated food without any problems? Yes.    Have you been able to return to your normal activities? Yes.    Do you have any questions about your discharge instructions: Diet   No. Medications  No. Follow up visit  No.  Do you have questions or concerns about your Care? No.  Actions: * If pain score is 4 or above: No action needed, pain <4.

## 2023-05-20 ENCOUNTER — Telehealth: Payer: Self-pay

## 2023-05-20 ENCOUNTER — Ambulatory Visit (HOSPITAL_COMMUNITY)
Admission: RE | Admit: 2023-05-20 | Discharge: 2023-05-20 | Disposition: A | Discharge status: Home / Self Care | Source: Ambulatory Visit | Attending: Gastroenterology | Admitting: Gastroenterology

## 2023-05-20 ENCOUNTER — Other Ambulatory Visit (HOSPITAL_COMMUNITY): Payer: Self-pay

## 2023-05-20 ENCOUNTER — Other Ambulatory Visit: Payer: Self-pay

## 2023-05-20 DIAGNOSIS — K50818 Crohn's disease of both small and large intestine with other complication: Secondary | ICD-10-CM | POA: Diagnosis present

## 2023-05-20 DIAGNOSIS — R1013 Epigastric pain: Secondary | ICD-10-CM | POA: Insufficient documentation

## 2023-05-20 LAB — SURGICAL PATHOLOGY

## 2023-05-20 MED ORDER — IOHEXOL 300 MG/ML  SOLN
100.0000 mL | Freq: Once | INTRAMUSCULAR | Status: AC | PRN
  Administered 2023-05-20: 100 mL via INTRAVENOUS

## 2023-05-20 MED ORDER — IOHEXOL 9 MG/ML PO SOLN
500.0000 mL | ORAL | Status: AC
  Administered 2023-05-20: 500 mL via ORAL

## 2023-05-20 NOTE — Telephone Encounter (Signed)
 I have submitted the PA for the induction dosing of 45 mg. Usually this will include authorization for maintenance automatically but will await determination for further info. New encounter created.

## 2023-05-20 NOTE — Telephone Encounter (Signed)
 Pharmacy Patient Advocate Encounter   Received notification from Pt Calls Messages that prior authorization for Rinvoq 45MG  er tablets is required/requested.   Insurance verification completed.   The patient is insured through [facility] .   Per test claim: PA required; PA submitted to above mentioned insurance via CoverMyMeds Key/confirmation #/EOC [id number] Status is pending

## 2023-05-20 NOTE — Telephone Encounter (Signed)
 Please start PA for induction dose of Rinvoq 45 mg daily for 12 weeks and then to 30 mg daily for maintenance for Crohn's disease.   Will need expedited approval from insurance to switch to Rinvoq or Skyrizi due to clinical failure with biosimilar of adalimumab 

## 2023-05-21 ENCOUNTER — Other Ambulatory Visit: Payer: Self-pay

## 2023-05-21 ENCOUNTER — Other Ambulatory Visit (HOSPITAL_COMMUNITY): Payer: Self-pay

## 2023-05-21 ENCOUNTER — Ambulatory Visit: Payer: Self-pay | Admitting: Gastroenterology

## 2023-05-21 DIAGNOSIS — R195 Other fecal abnormalities: Secondary | ICD-10-CM

## 2023-05-21 DIAGNOSIS — R1013 Epigastric pain: Secondary | ICD-10-CM

## 2023-05-21 DIAGNOSIS — K50818 Crohn's disease of both small and large intestine with other complication: Secondary | ICD-10-CM

## 2023-05-21 MED ORDER — RINVOQ 45 MG PO TB24: 45.0000 mg | ORAL_TABLET | Freq: Every day | ORAL | 0 refills | Status: AC

## 2023-05-21 MED ORDER — METRONIDAZOLE 500 MG PO TABS: 500.0000 mg | ORAL_TABLET | Freq: Two times a day (BID) | ORAL | 0 refills | Status: AC

## 2023-05-21 MED ORDER — RINVOQ 30 MG PO TB24: 30.0000 mg | ORAL_TABLET | Freq: Every day | ORAL | 6 refills | Status: DC

## 2023-05-21 MED ORDER — CIPROFLOXACIN HCL 500 MG PO TABS
500.0000 mg | ORAL_TABLET | Freq: Three times a day (TID) | ORAL | 0 refills | Status: AC
Start: 2023-05-21 — End: 2023-06-04

## 2023-05-21 NOTE — Telephone Encounter (Signed)
 Since he is required to use an outside pharmacy, that pharmacy should be contacting him

## 2023-05-21 NOTE — Telephone Encounter (Signed)
 Pharmacy Patient Advocate Encounter  Additional PA tried for 30MG , but denied due to this submission and approval. Letter did not mention maintenance dose, but possible it does cover.   Patient must fill at [facility]

## 2023-05-21 NOTE — Telephone Encounter (Signed)
 Pharmacy Patient Advocate Encounter  Received notification from [employer] that Prior Authorization for Rinvoq 45MG  er tablets has been APPROVED from 05-20-2023 to 11-20-2023   PA #/Case ID/Reference #: [id number]   *This does not include maintenance dose, separate encounter to be made for this (30MG )

## 2023-05-21 NOTE — Telephone Encounter (Signed)
 Do you know who will sign him up for the [facility] program or the co-pay card?  Thanks

## 2023-05-21 NOTE — Telephone Encounter (Signed)
 Prescription originally sent to local pharmacy. Contacted this pharmacy and canceled the prescriptions.  Redirected prescriptions to  [facility] [place], [place] - [address], [address] (Ph: [phone])

## 2023-05-27 LAB — SERIAL MONITORING

## 2023-05-28 LAB — ADALIMUMAB+AB (SERIAL MONITOR)
Adalimumab Drug Level: 5.7 ug/mL
Anti-Adalimumab Antibody: <25 ng/mL

## 2023-06-04 ENCOUNTER — Telehealth: Payer: Self-pay | Admitting: Gastroenterology

## 2023-06-04 NOTE — Telephone Encounter (Signed)
 Patient requesting to speak with a nurse in regards to question he has. Patient did not disclose what he wanted to speak about. Please advise.   Thank you

## 2023-06-04 NOTE — Telephone Encounter (Signed)
 The patient started on Rinvoq  45 mg 05/26/23. He reports he is beginning to feel better, more energy, and less depressed of blue feeling.  He is waiting to schedule the repeat CT A/P.  He agrees to contact me week 10 or 11 of the induction. We will need to send the Rinvoq  30 mg to his pharmacy so he can begin it on day 85 (day after the last does of 45 mg.)

## 2023-06-15 ENCOUNTER — Telehealth: Payer: Self-pay | Admitting: Gastroenterology

## 2023-06-15 NOTE — Telephone Encounter (Signed)
 Patient called and stated that he is needing for [name] to return his call. Patient did not want to provide anymore information. Please advise.

## 2023-06-15 NOTE — Telephone Encounter (Signed)
 Patient is finishing the first 4 weeks of Rinvoq  45 mg. He will call the pharmacy at [phone] and request the last refill to complete the 8 week induction dose.

## 2023-06-17 ENCOUNTER — Ambulatory Visit: Payer: Self-pay | Admitting: Gastroenterology

## 2023-06-22 ENCOUNTER — Telehealth: Payer: Self-pay

## 2023-06-22 NOTE — Telephone Encounter (Signed)
 The patient did not get his CT A/P scheduled as was planned. He tells me he has gotten the messages on his phone and through My Chart. Encouraged to call asap. This is to follow up on the abscess found on CT of 05/20/23. The patient has a follow up appointment with Dr Leonia Raman 06/24/23.

## 2023-06-22 NOTE — Telephone Encounter (Signed)
 Patient requesting f/u call to discuss results Please advise

## 2023-06-23 ENCOUNTER — Ambulatory Visit: Payer: 59

## 2023-06-23 DIAGNOSIS — I447 Left bundle-branch block, unspecified: Secondary | ICD-10-CM

## 2023-06-24 ENCOUNTER — Encounter: Payer: Self-pay | Admitting: Gastroenterology

## 2023-06-24 ENCOUNTER — Ambulatory Visit: Payer: Self-pay | Admitting: Gastroenterology

## 2023-06-24 ENCOUNTER — Ambulatory Visit (INDEPENDENT_AMBULATORY_CARE_PROVIDER_SITE_OTHER): Admitting: Gastroenterology

## 2023-06-24 ENCOUNTER — Other Ambulatory Visit (INDEPENDENT_AMBULATORY_CARE_PROVIDER_SITE_OTHER)

## 2023-06-24 VITALS — BP 102/70 | HR 59 | Ht 70.0 in | Wt 179.8 lb

## 2023-06-24 DIAGNOSIS — K641 Second degree hemorrhoids: Secondary | ICD-10-CM | POA: Diagnosis not present

## 2023-06-24 DIAGNOSIS — K5000 Crohn's disease of small intestine without complications: Secondary | ICD-10-CM | POA: Diagnosis not present

## 2023-06-24 DIAGNOSIS — K50813 Crohn's disease of both small and large intestine with fistula: Secondary | ICD-10-CM

## 2023-06-24 DIAGNOSIS — K602 Anal fissure, unspecified: Secondary | ICD-10-CM | POA: Diagnosis not present

## 2023-06-24 DIAGNOSIS — K50014 Crohn's disease of small intestine with abscess: Secondary | ICD-10-CM | POA: Diagnosis not present

## 2023-06-24 DIAGNOSIS — K625 Hemorrhage of anus and rectum: Secondary | ICD-10-CM

## 2023-06-24 DIAGNOSIS — K651 Peritoneal abscess: Secondary | ICD-10-CM

## 2023-06-24 LAB — COMPREHENSIVE METABOLIC PANEL WITH GFR
ALT: 29 U/L (ref 0–53)
AST: 28 U/L (ref 0–37)
Albumin: 4.4 g/dL (ref 3.5–5.2)
Alkaline Phosphatase: 40 U/L (ref 39–117)
BUN: 12 mg/dL (ref 6–23)
CO2: 29 meq/L (ref 19–32)
Calcium: 9.7 mg/dL (ref 8.4–10.5)
Chloride: 107 meq/L (ref 96–112)
Creatinine, Ser: 0.85 mg/dL (ref 0.40–1.50)
GFR: 95.27 mL/min (ref >60.00)
Glucose, Bld: 114 mg/dL — ABNORMAL HIGH (ref 70–99)
Potassium: 4.5 meq/L (ref 3.5–5.1)
Sodium: 140 meq/L (ref 135–145)
Total Bilirubin: 1.3 mg/dL — ABNORMAL HIGH (ref 0.2–1.2)
Total Protein: 7 g/dL (ref 6.0–8.3)

## 2023-06-24 LAB — CBC WITH DIFFERENTIAL/PLATELET
Basophils Absolute: 0 10*3/uL (ref 0.0–0.1)
Basophils Relative: 0.2 % (ref 0.0–3.0)
Eosinophils Absolute: 0.1 10*3/uL (ref 0.0–0.7)
Eosinophils Relative: 1.1 % (ref 0.0–5.0)
HCT: 31.2 % — ABNORMAL LOW (ref 39.0–52.0)
Hemoglobin: 10.6 g/dL — ABNORMAL LOW (ref 13.0–17.0)
Lymphocytes Relative: 25.5 % (ref 12.0–46.0)
Lymphs Abs: 1.7 10*3/uL (ref 0.7–4.0)
MCHC: 33.9 g/dL (ref 30.0–36.0)
MCV: 90.9 fl (ref 78.0–100.0)
Monocytes Absolute: 0 10*3/uL — ABNORMAL LOW (ref 0.1–1.0)
Monocytes Relative: 0.7 % — ABNORMAL LOW (ref 3.0–12.0)
Neutro Abs: 5 10*3/uL (ref 1.4–7.7)
Neutrophils Relative %: 72.5 % (ref 43.0–77.0)
Platelets: 179 10*3/uL (ref 150.0–400.0)
RBC: 3.43 Mil/uL — ABNORMAL LOW (ref 4.22–5.81)
RDW: 16.1 % — ABNORMAL HIGH (ref 11.5–15.5)
WBC: 6.9 10*3/uL (ref 4.0–10.5)

## 2023-06-24 LAB — HIGH SENSITIVITY CRP: CRP, High Sensitivity: 0.5 mg/L (ref 0.000–5.000)

## 2023-06-24 MED ORDER — AMBULATORY NON FORMULARY MEDICATION: 0 refills | Status: DC

## 2023-06-24 NOTE — Patient Instructions (Addendum)
 VISIT SUMMARY:  You came in today for a follow-up regarding your Crohn's disease and recent symptoms. You are currently on Rinvoq  and have seen some improvement in your symptoms. We discussed your bowel habits, rectal bleeding, and the history of your condition, including the recent flare-up due to a switch in medication.  YOUR PLAN:  -CROHN'S DISEASE WITH TERMINAL ILEUM INVOLVEMENT: Crohn's disease is a chronic inflammatory condition of the gastrointestinal tract. You experienced a flare-up due to a medication switch, but your symptoms are improving with Rinvoq . We will continue your Rinvoq  therapy and review your upcoming CT scan results to assess inflammation and ensure there are no complications. Check CBC, CMP and CRP  -ANAL FISSURE: An anal fissure is a small tear in the lining of the anus, often causing pain and bleeding during bowel movements. To help with healing, you should apply nitroglycerin  cream 3-4 times daily, use Benefiber (1 tablespoon with breakfast and dinner), and take Miralax (1 tablespoon at bedtime). Keep the anal area dry and avoid using Cialis, Viagra, or similar medications while on nitroglycerin  cream.  -RECTAL BLEEDING: Your rectal bleeding is due to an anal fissure rather than hemorrhoids. To prevent worsening of symptoms, avoid heavy lifting.  INSTRUCTIONS:  Please continue taking Rinvoq  as prescribed. Apply nitroglycerin  cream 3-4 times daily for your anal fissure, use Benefiber (1 tablespoon with breakfast and dinner), and take Miralax (1 tablespoon at bedtime). Keep the anal area dry and avoid using Cialis, Viagra, or similar medications while on nitroglycerin  cream. Avoid heavy lifting to prevent exacerbation of rectal bleeding. We will review your CT scan results to assess inflammation and abscess resolution.  We have sent a prescription for nitroglycerin  0.125% gel to [facility]. You should apply a pea size amount to your rectum three times daily x 6-8  weeks.  [facility]'s information is below: Address: [address], [place], [place] [address]  [phone]  *Please DO NOT go directly from our office to pick up this medication! Give the pharmacy 1 day to process the prescription as this is compounded and takes time to make.   I appreciate the  opportunity to care for you  Thank You   Kavitha Nandigam , MD

## 2023-06-24 NOTE — Progress Notes (Signed)
 Miguel Pena    [id number]    September 18, 1965  Primary Care Physician:Elkins, Suzette Espy, MD  Referring Physician: Candiss Chamorro, MD [address] [place],  [place] [address]   Chief complaint:  Crohn's disease  Discussed the use of AI scribe software for clinical note transcription with the patient, who gave verbal consent to proceed.  History of Present Illness Miguel Pena is a 58 year old male with Crohn's disease who presents for follow-up regarding medication management and recent symptoms.  He is currently undergoing treatment with Rinvoq  for Crohn's disease and is still completing the induction high dose with another week remaining. Previous imaging and colonoscopy showed thickening in the terminal ileum and irritation in the small bowel. He reports improvement in discomfort and pain on the right side, with no current pain in the stomach area or right lower quadrant.  His bowel habits have improved, with bowel movements occurring at least once a day. However, he experiences a small amount of blood in the stool, which he attributes to hemorrhoids. He experiences bleeding almost every time he has a bowel movement. He has a history of hemorrhoid banding, having undergone three bandings previously, and wants to address the hemorrhoid issue.  He has been experiencing symptoms for the past two to three months, which he attributes to a flare-up of Crohn's disease following a switch from Humira  to a biosimilar at the start of the year. This switch led to a flare with an abscess in the ileum, requiring antibiotics and prednisone , which improved his symptoms. The insurance-mandated switch from Humira  to a biosimilar disrupted his previous stable condition, leading to significant symptoms and requiring multiple tests, including colonoscopy and CT scans.  He is currently taking Rinvoq  and reports improvement in symptoms. He also uses Benefiber to aid bowel movements,  taking one tablespoon with breakfast and dinner. He has access to Miralax, which he uses sparingly, as his wife also uses it for her constipation issues.   He is s/p CRT-D March 2019.    GI Hx:   CT abd & pelvis with contrast 05/20/23 1. Terminal ileal wall thickening and surrounding inflammatory changes, consistent with acute exacerbation of Crohn disease. 1 cm focal area of mural thickening with central gas lucency in the inflamed terminal ileum may reflect a small focal intramural abscess or developing fistula. 2. Cholelithiasis without cholecystitis. 3. Distal colonic diverticulosis without evidence of acute diverticulitis. 4.  Aortic Atherosclerosis (ICD10-I70.0). Colonoscopy 05/18/23  - Simple Endoscopic Score for Crohn' s Disease: 3, mucosal inflammatory changes secondary to Crohn' s disease, with ileitis and colitis. Biopsied. - Diverticulosis in the sigmoid colon. - Non- bleeding external and internal hemorrhoids.  EGD December 06, 2018: LA grade C esophagitis and gastritis.  H. pylori negative   Colonoscopy December 06, 2018: Terminal ileum ulceration with ileitis, biopsies consistent with IBD.  Diverticulosis and internal hemorrhoids otherwise normal exam.   Capsule endoscopy October 22, 2018: Multiple aphthous ulcers and inflammation throughout small bowel, predominantly in the distal small bowel consistent with Crohn's ileitis     03/10/2017 EGD : LA grade C reflux and candidiasis esophagitis, benign-appearing esophageal stenosis, gastritis with hemorrhage, duodenal erosions without bleeding otherwise normal.  Started on Nexium  40 twice daily.      03/10/2017  Colonoscopy : moderate diverticulosis in the sigmoid and descending colon, narrowing of the colon in association with diverticular opening, peridiverticular erythema, 1 5 mm polyp in the sigmoid colon  and nonbleeding internal hemorrhoids.   Pathology showed chronic gastritis and reflux esophagitis negative for H. pylori or  dysplasia.  Polyp removed was hyperplastic.  Recall colonoscopy recommended in 10 years.      CT abdomen pelvis with contrast 09/29/2018 with inflammatory changes and thickening of the distal and terminal ileum which may represent infectious enteritis or an inflammatory bowel disease.  No bowel obstruction, normal appendix.  Sigmoid diverticulosis and cholelithiasis    Outpatient Encounter Medications as of 06/24/2023  Medication Sig   bisoprolol  (ZEBETA ) 5 MG tablet TAKE 1 TABLET(5 MG) BY MOUTH DAILY   dicyclomine  (BENTYL ) 10 MG capsule Take 10 mg by mouth as needed for spasms.   ENTRESTO  97-103 MG TAKE 1 TABLET BY MOUTH TWICE DAILY   FEROSUL 325 (65 Fe) MG tablet Take 325 mg by mouth daily.   furosemide  (LASIX ) 40 MG tablet Take 1 tablet (40 mg total) by mouth daily as needed (lower extremity swelling and weight gain).   Homeopathic Products (ZICAM ALLERGY RELIEF NA) Place 1 spray into the nose daily as needed (allergies).   HYDROcodone -acetaminophen  (NORCO) 10-325 MG tablet Take 1 tablet by mouth 5 (five) times daily.   Multiple Vitamin (MULTIVITAMIN WITH MINERALS) TABS tablet Take 1 tablet by mouth in the morning.   nicotine  polacrilex (NICORETTE ) 4 MG gum Take 4 mg by mouth as needed for smoking cessation.   omega-3 acid ethyl esters (LOVAZA ) 1 g capsule Take 1 g by mouth 2 (two) times daily.   pantoprazole  (PROTONIX ) 40 MG tablet TAKE 1 TABLET(40 MG) BY MOUTH DAILY BEFORE BREAKFAST   potassium chloride  SA (KLOR-CON  M) 20 MEQ tablet Take 1 tablet (20 mEq total) by mouth daily as needed (take with as needed lasix .).   rosuvastatin  (CRESTOR ) 40 MG tablet TAKE 1 TABLET(40 MG) BY MOUTH DAILY   spironolactone  (ALDACTONE ) 25 MG tablet TAKE 1 TABLET(25 MG) BY MOUTH DAILY   Upadacitinib  ER (RINVOQ ) 30 MG TB24 Take 1 tablet (30 mg total) by mouth daily.   Upadacitinib  ER (RINVOQ ) 45 MG TB24 Take 1 tablet (45 mg total) by mouth daily. FOR 12 WEEKS   Na Sulfate-K Sulfate-Mg Sulfate concentrate  (SUPREP) 17.5-3.13-1.6 GM/177ML SOLN Follow the instructions from Dr Leonia Raman   No facility-administered encounter medications on file as of 06/24/2023.    Allergies as of 06/24/2023   (No Known Allergies)    Past Medical History:  Diagnosis Date   Bursitis of right shoulder    CHF (congestive heart failure) (HCC)    [name] 10/24/2016   Chronic combined systolic and diastolic heart failure (HCC) 03/16/2017   Coronary artery disease    Dilated cardiomyopathy (HCC)    [name] 10/24/2016   Dysrhythmia 1995   VT per patient- states had neg stress test and no problems since   Encounter for adjustment or management of automatic implantable cardioverter-defibrillator 06/17/2018   Encounter for assessment of automatic implantable cardioverter-defibrillator (AICD) 06/17/2018   Heart murmur    when I was young   History of kidney stones    Hypercholesterolemia    ICD: Medtronic MRI Quad CRTD (Bi-V ICD) implantation 03/16/2017 [name] 03/16/2017   Scheduled Remote ICD check   8.7.20: No VHR episodes. No mode switches. Normal health trends. Trans-thoracic impedance trends and the OptiVol Fluid Index do no present significant abnormalities. Battery longevity is 6.5-8.3 years. RA pacing is 8.3 %, RV pacing is 99.9 %, and LV pacing is 99.9 %.  Clinic: 05/18/17   NICM (nonischemic cardiomyopathy) (HCC) 03/16/2017  Past Surgical History:  Procedure Laterality Date   BIOPSY  12/06/2018   Procedure: BIOPSY;  Surgeon: [name], MD;  Location: [facility];  Service: Endoscopy;;  Egd and Colon   BIV ICD INSERTION CRT-D N/A 03/16/2017   Procedure: BIV ICD INSERTION CRT-D;  Surgeon: [name], MD;  Location: [facility] INVASIVE CV LAB;  Service: Cardiovascular;  Laterality: N/A;   BUBBLE STUDY  11/30/2019   Procedure: BUBBLE STUDY;  Surgeon: [name], DO;  Location: [facility];  Service: Cardiovascular;;   COLONOSCOPY WITH PROPOFOL  N/A 03/10/2017   Procedure: COLONOSCOPY WITH  PROPOFOL ;  Surgeon: [name], MD;  Location: [facility];  Service: Endoscopy;  Laterality: N/A;   COLONOSCOPY WITH PROPOFOL  N/A 12/06/2018   Procedure: COLONOSCOPY WITH PROPOFOL ;  Surgeon: [name], MD;  Location: [facility];  Service: Endoscopy;  Laterality: N/A;   CORONARY ANGIOPLASTY WITH STENT PLACEMENT  10/28/2016   CORONARY PRESSURE/FFR STUDY N/A 10/28/2016   Procedure: INTRAVASCULAR PRESSURE WIRE/FFR STUDY;  Surgeon: [name], MD;  Location: [facility] INVASIVE CV LAB;  Service: Cardiovascular;  Laterality: N/A;   CORONARY STENT INTERVENTION N/A 10/28/2016   Procedure: CORONARY STENT INTERVENTION;  Surgeon: [name], MD;  Location: [facility] INVAS[facility] CV LAB;  Service: Cardiovascular;  Laterality: N/A;   ESOPHAGOGASTRODUODENOSCOPY (EGD) WITH PROPOFOL  N/A 03/10/2017   Procedure: ESOPHAGOGASTRODUODENOSCOPY (EGD) WITH PROPOFOL ;  Surgeon: [name], MD;  Location: [facility];  Service: Endoscopy;  Laterality: N/A;   ESOPHAGOGASTRODUODENOSCOPY (EGD) WITH PROPOFOL  N/A 12/06/2018   Procedure: ESOPHAGOGASTRODUODENOSCOPY (EGD) WITH PROPOFOL ;  Surgeon: [name], MD;  Location: [facility];  Service: Endoscopy;  Laterality: N/A;   IMPLANTABLE CARDIOVERTER DEFIBRILLATOR (ICD) GENERATOR CHANGE N/A 03/28/2021   Procedure: ICD GENERATOR CHANGE LV LEAD EXTRACTION AND LV LEAD REPLACEMENT;  Surgeon: [name], MD;  Location: [facility] OR;  Service: Cardiovascular;  Laterality: N/A;   INGUINAL HERNIA REPAIR Right 02/16/2013   Procedure: RIGHT INGUINAL HERNIA REPAIR ;  Surgeon: [name], MD;  Location: [facility] ORS;  Service: General;  Laterality: Right;  With MESH   INGUINAL HERNIA REPAIR Right 02/16/2013   RIGHT/LEFT HEART CATH AND CORONARY ANGIOGRAPHY N/A 10/28/2016   Procedure: RIGHT/LEFT HEART CATH AND CORONARY ANGIOGRAPHY;  Surgeon: [name], MD;  Location: [facility] CV LAB;  Service: Cardiovascular;  Laterality: N/A;   TEE WITHOUT  CARDIOVERSION N/A 11/30/2019   Procedure: TRANSESOPHAGEAL ECHOCARDIOGRAM (TEE);  Surgeon: [name], DO;  Location: [facility];  Service: Cardiovascular;  Laterality: N/A;   ULTRASOUND GUIDANCE FOR VASCULAR ACCESS  10/28/2016   Procedure: Ultrasound Guidance For Vascular Access;  Surgeon: [name], MD;  Location: [facility] INVASIVE CV LAB;  Service: Cardiovascular;;    Family History  Problem Relation Age of Onset   Heart attack Father    Colon polyps Father    Colon polyps Brother    Colon cancer Neg Hx    Stomach cancer Neg Hx    Pancreatic cancer Neg Hx    Esophageal cancer Neg Hx    Liver disease Neg Hx    Rectal cancer Neg Hx     Social History   Socioeconomic History   Marital status: Married    Spouse name: Not on file   Number of children: 2   Years of education: Not on file   Highest education level: Not on file  Occupational History   Occupation: Transport planner    Comment: [employer]  Tobacco Use   Smoking status: Former  Current packs/day: 0.00    Average packs/day: 1 pack/day for 12.0 years (12.0 ttl pk-yrs)    Types: Cigarettes, Cigars    Start date: 02/16/1999    Quit date: 02/16/2011    Years since quitting: 12.3   Smokeless tobacco: Former    Types: Snuff   Tobacco comments:    10/28/2016 'chewed for a couple years after I quit smoking; 5-6 x/day as of 05/18/23  Vaping Use   Vaping status: Never Used  Substance and Sexual Activity   Alcohol use: No   Drug use: No   Sexual activity: Not Currently  Other Topics Concern   Not on file  Social History Narrative   Not on file   Social Drivers of Health   Financial Resource Strain: Not on file  Food Insecurity: Not on file  Transportation Needs: Not on file  Physical Activity: Not on file  Stress: Not on file  Social Connections: Not on file  Intimate Partner Violence: Not on file      Review of systems: All other review of systems negative except as mentioned in the  HPI.   Physical Exam: Vitals:   06/24/23 0904  BP: 102/70  Pulse: (!) 59   Body mass index is 25.8 kg/m. Gen:      No acute distress HEENT:  sclera anicteric Abd:      soft, non-tender; no palpable masses, no distension Ext:    No edema Neuro: alert and oriented x 3 Psych: normal mood and affect Rectal exam: Normal anal sphincter tone, no external hemorrhoids Anoscopy: Small posterior anal fissure, normal dentate line, no visible nodules  Data Reviewed:  Reviewed labs, radiology imaging, old records and pertinent past GI work up     Assessment and Plan Assessment & Plan Crohn's disease with terminal ileum involvement Previously managed with Humira , experienced a flare-up due to insurance-mandated switch to a biosimilar, resulting in acute Crohn's flare with an ileal abscess and increased symptoms. Currently on Rinvoq , showing symptom improvement. Awaiting CT scan results to assess inflammation and abscess resolution, and to ensure no complications such as fistula formation. Improvement with Rinvoq  indicates a positive response to the current treatment plan. - Continue Rinvoq  therapy - Review CT scan results to assess inflammation and abscess resolution  Anal fissure Identified as the source of rectal bleeding and discomfort, likely exacerbated by frequent bowel movements and diarrhea. Causes pain and incomplete evacuation during bowel movements. Advised to avoid moisture in the anal area to promote healing. - Prescribe nitroglycerin  cream 0.125%, apply 3-4 times daily - Advise on the use of Benefiber, 1 tablespoon with breakfast and dinner - Recommend Miralax, 1 tablespoon at bedtime - Instruct to keep anal area dry - Advise against the use of Cialis, Viagra, or similar medications while using nitroglycerin  cream due to potential serious interactions  Rectal bleeding Attributed to an anal fissure rather than hemorrhoids. Bleeding occurs almost every time during bowel  movements, but hemorrhoids are not currently problematic. Advised to avoid heavy lifting to prevent exacerbation of symptoms. - Advise against heavy lifting to prevent exacerbation of symptoms   The patient was provided an opportunity to ask questions and all were answered. The patient agreed with the plan and demonstrated an understanding of the instructions.  Lorena Rolling , MD    CC: Candiss Chamorro, *

## 2023-06-25 ENCOUNTER — Ambulatory Visit (HOSPITAL_COMMUNITY)
Admission: RE | Admit: 2023-06-25 | Discharge: 2023-06-25 | Disposition: A | Discharge status: Home / Self Care | Source: Ambulatory Visit | Attending: Gastroenterology | Admitting: Gastroenterology

## 2023-06-25 ENCOUNTER — Other Ambulatory Visit

## 2023-06-25 DIAGNOSIS — R195 Other fecal abnormalities: Secondary | ICD-10-CM | POA: Diagnosis present

## 2023-06-25 DIAGNOSIS — K641 Second degree hemorrhoids: Secondary | ICD-10-CM

## 2023-06-25 DIAGNOSIS — R1013 Epigastric pain: Secondary | ICD-10-CM | POA: Diagnosis present

## 2023-06-25 DIAGNOSIS — K5000 Crohn's disease of small intestine without complications: Secondary | ICD-10-CM

## 2023-06-25 DIAGNOSIS — K50818 Crohn's disease of both small and large intestine with other complication: Secondary | ICD-10-CM | POA: Diagnosis present

## 2023-06-25 MED ORDER — IOHEXOL 9 MG/ML PO SOLN
ORAL | Status: AC
Start: 2023-06-25 — End: 2023-06-25
  Filled 2023-06-25: qty 1000

## 2023-06-25 MED ORDER — IOHEXOL 300 MG/ML  SOLN
100.0000 mL | Freq: Once | INTRAMUSCULAR | Status: AC | PRN
  Administered 2023-06-25: 100 mL via INTRAVENOUS

## 2023-06-25 MED ORDER — SODIUM CHLORIDE (PF) 0.9 % IJ SOLN
INTRAMUSCULAR | Status: AC
Start: 2023-06-25 — End: 2023-06-25
  Filled 2023-06-25: qty 50

## 2023-06-25 MED ORDER — IOHEXOL 9 MG/ML PO SOLN
1000.0000 mL | Freq: Once | ORAL | Status: AC
  Administered 2023-06-25: 1000 mL via ORAL

## 2023-06-29 LAB — CALPROTECTIN, FECAL: Calprotectin, Fecal: 500 ug/g — ABNORMAL HIGH (ref 0–120)

## 2023-07-01 LAB — CUP PACEART REMOTE DEVICE CHECK
Battery Remaining Longevity: 96 mo
Battery Voltage: 3 V
Brady Statistic AP VP Percent: 6.55 %
Brady Statistic AP VS Percent: 0.32 %
Brady Statistic AS VP Percent: 91.62 %
Brady Statistic AS VS Percent: 1.52 %
Brady Statistic RA Percent Paced: 6.86 %
Brady Statistic RV Percent Paced: 1.42 %
Date Time Interrogation Session: 20250619195531
HighPow Impedance: 56 Ohm
Implantable Lead Connection Status: 753985
Implantable Lead Connection Status: 753985
Implantable Lead Connection Status: 753985
Implantable Lead Implant Date: 20190311
Implantable Lead Implant Date: 20190311
Implantable Lead Implant Date: 20230323
Implantable Lead Location: 753858
Implantable Lead Location: 753859
Implantable Lead Location: 753860
Implantable Lead Model: 4398
Implantable Lead Model: 5076
Implantable Pulse Generator Implant Date: 20230323
Lead Channel Impedance Value: 180 Ohm
Lead Channel Impedance Value: 191.854
Lead Channel Impedance Value: 191.854
Lead Channel Impedance Value: 203.256
Lead Channel Impedance Value: 203.256
Lead Channel Impedance Value: 266 Ohm
Lead Channel Impedance Value: 285 Ohm
Lead Channel Impedance Value: 342 Ohm
Lead Channel Impedance Value: 380 Ohm
Lead Channel Impedance Value: 380 Ohm
Lead Channel Impedance Value: 399 Ohm
Lead Channel Impedance Value: 437 Ohm
Lead Channel Impedance Value: 437 Ohm
Lead Channel Impedance Value: 665 Ohm
Lead Channel Impedance Value: 665 Ohm
Lead Channel Impedance Value: 665 Ohm
Lead Channel Impedance Value: 722 Ohm
Lead Channel Impedance Value: 760 Ohm
Lead Channel Pacing Threshold Amplitude: 0.5 V
Lead Channel Pacing Threshold Amplitude: 0.5 V
Lead Channel Pacing Threshold Amplitude: 0.625 V
Lead Channel Pacing Threshold Pulse Width: 0.4 ms
Lead Channel Pacing Threshold Pulse Width: 0.4 ms
Lead Channel Pacing Threshold Pulse Width: 0.4 ms
Lead Channel Sensing Intrinsic Amplitude: 0.25 mV
Lead Channel Sensing Intrinsic Amplitude: 0.25 mV
Lead Channel Sensing Intrinsic Amplitude: 2.625 mV
Lead Channel Sensing Intrinsic Amplitude: 2.625 mV
Lead Channel Setting Pacing Amplitude: 1.25 V
Lead Channel Setting Pacing Amplitude: 1.5 V
Lead Channel Setting Pacing Amplitude: 2 V
Lead Channel Setting Pacing Pulse Width: 0.4 ms
Lead Channel Setting Pacing Pulse Width: 0.4 ms
Lead Channel Setting Sensing Sensitivity: 0.3 mV
Zone Setting Status: 755011
Zone Setting Status: 755011

## 2023-07-02 ENCOUNTER — Ambulatory Visit: Payer: Self-pay | Admitting: Cardiology

## 2023-07-07 ENCOUNTER — Encounter

## 2023-07-08 ENCOUNTER — Ambulatory Visit: Payer: Self-pay | Admitting: Gastroenterology

## 2023-07-16 NOTE — Telephone Encounter (Signed)
 Spoke with the patient. He acknowledges that he is aware the dosage of Rinvoq  will be 30 mg after he finishes the prescription he has right now of the induction dosage 45 mg. He will reach out to me if he encounters any problems getting the Rinvoq  30 mg prescription.

## 2023-07-21 ENCOUNTER — Encounter: Admitting: Gastroenterology

## 2023-09-02 ENCOUNTER — Other Ambulatory Visit (INDEPENDENT_AMBULATORY_CARE_PROVIDER_SITE_OTHER)

## 2023-09-02 ENCOUNTER — Encounter: Payer: Self-pay | Admitting: Gastroenterology

## 2023-09-02 ENCOUNTER — Ambulatory Visit (INDEPENDENT_AMBULATORY_CARE_PROVIDER_SITE_OTHER): Admitting: Gastroenterology

## 2023-09-02 VITALS — BP 124/66 | HR 75 | Ht 70.0 in | Wt 182.0 lb

## 2023-09-02 DIAGNOSIS — E559 Vitamin D deficiency, unspecified: Secondary | ICD-10-CM

## 2023-09-02 DIAGNOSIS — K50818 Crohn's disease of both small and large intestine with other complication: Secondary | ICD-10-CM

## 2023-09-02 DIAGNOSIS — K5090 Crohn's disease, unspecified, without complications: Secondary | ICD-10-CM

## 2023-09-02 DIAGNOSIS — D500 Iron deficiency anemia secondary to blood loss (chronic): Secondary | ICD-10-CM

## 2023-09-02 DIAGNOSIS — D509 Iron deficiency anemia, unspecified: Secondary | ICD-10-CM

## 2023-09-02 LAB — CBC WITH DIFFERENTIAL/PLATELET
Basophils Absolute: 0 K/uL (ref 0.0–0.1)
Basophils Relative: 0 % (ref 0.0–3.0)
Eosinophils Absolute: 0.1 K/uL (ref 0.0–0.7)
Eosinophils Relative: 0.7 % (ref 0.0–5.0)
HCT: 30.1 % — ABNORMAL LOW (ref 39.0–52.0)
Hemoglobin: 9.8 g/dL — ABNORMAL LOW (ref 13.0–17.0)
Lymphocytes Relative: 12.2 % (ref 12.0–46.0)
Lymphs Abs: 1.8 K/uL (ref 0.7–4.0)
MCHC: 32.7 g/dL (ref 30.0–36.0)
MCV: 95.2 fl (ref 78.0–100.0)
Monocytes Absolute: 0.1 K/uL (ref 0.1–1.0)
Monocytes Relative: 0.5 % — ABNORMAL LOW (ref 3.0–12.0)
Neutro Abs: 12.5 K/uL — ABNORMAL HIGH (ref 1.4–7.7)
Neutrophils Relative %: 86.6 % — ABNORMAL HIGH (ref 43.0–77.0)
Platelets: 216 K/uL (ref 150.0–400.0)
RBC: 3.16 Mil/uL — ABNORMAL LOW (ref 4.22–5.81)
RDW: 16.8 % — ABNORMAL HIGH (ref 11.5–15.5)
WBC: 14.5 K/uL — ABNORMAL HIGH (ref 4.0–10.5)

## 2023-09-02 NOTE — Progress Notes (Signed)
 Miguel Pena    [id number]    01/18/1965  Primary Care Physician:Elkins, Tanda Mae, MD  Referring Physician: Loring Tanda Mae, MD [address] [place],  [place] [address]   Chief complaint:  Crohn's disease  Discussed the use of AI scribe software for clinical note transcription with the patient, who gave verbal consent to proceed.  History of Present Illness Miguel Pena is a 58 year old male with Crohn's disease who presents for follow-up of his condition.  Gastrointestinal symptoms - No abdominal pain or gastrointestinal disturbances - Bowel movements are regular - No constipation or hematochezia - Weight remains stable - Previous rectal bleeding and fissure have resolved  Hematologic status - History of anemia secondary to gastrointestinal bleeding - Currently taking iron and multivitamin supplementation  Nutritional status - Vitamin D  deficiency managed with supplementation - Adheres to vitamin D  supplementation regimen  General well-being - Energy levels are improving - Attributes improved energy to current medication regimen, including a medication that maintains heart rate in the fifties    Latest Ref Rng & Units 06/24/2023    9:54 AM 05/18/2023   11:21 AM 12/10/2022    4:17 PM  CBC  WBC 4.0 - 10.5 K/uL 6.9  18.3 Repeated and verified X2.  11.0   Hemoglobin 13.0 - 17.0 g/dL 89.3  88.2  88.4   Hematocrit 39.0 - 52.0 % 31.2  36.2  34.3   Platelets 150.0 - 400.0 K/uL 179.0  264.0  201.0     Fecal calprotectin 500 June 2025   He is s/p CRT-D March 2019.   GI Hx:    CT abd & pelvis with contrast 05/20/23 1. Terminal ileal wall thickening and surrounding inflammatory changes, consistent with acute exacerbation of Crohn disease. 1 cm focal area of mural thickening with central gas lucency in the inflamed terminal ileum may reflect a small focal intramural abscess or developing fistula. 2. Cholelithiasis without cholecystitis. 3.  Distal colonic diverticulosis without evidence of acute diverticulitis. 4.  Aortic Atherosclerosis (ICD10-I70.0). Colonoscopy 05/18/23  - Simple Endoscopic Score for Crohn' s Disease: 3, mucosal inflammatory changes secondary to Crohn' s disease, with ileitis and colitis. Biopsied. - Diverticulosis in the sigmoid colon. - Non- bleeding external and internal hemorrhoids.   EGD December 06, 2018: LA grade C esophagitis and gastritis.  H. pylori negative   Colonoscopy December 06, 2018: Terminal ileum ulceration with ileitis, biopsies consistent with IBD.  Diverticulosis and internal hemorrhoids otherwise normal exam.   Capsule endoscopy October 22, 2018: Multiple aphthous ulcers and inflammation throughout small bowel, predominantly in the distal small bowel consistent with Crohn's ileitis     03/10/2017 EGD : LA grade C reflux and candidiasis esophagitis, benign-appearing esophageal stenosis, gastritis with hemorrhage, duodenal erosions without bleeding otherwise normal.  Started on Nexium  40 twice daily.      03/10/2017  Colonoscopy : moderate diverticulosis in the sigmoid and descending colon, narrowing of the colon in association with diverticular opening, peridiverticular erythema, 1 5 mm polyp in the sigmoid colon and nonbleeding internal hemorrhoids.   Pathology showed chronic gastritis and reflux esophagitis negative for H. pylori or dysplasia.  Polyp removed was hyperplastic.  Recall colonoscopy recommended in 10 years.      CT abdomen pelvis with contrast 09/29/2018 with inflammatory changes and thickening of the distal and terminal ileum which may represent infectious enteritis or an inflammatory bowel disease.  No bowel obstruction, normal appendix.  Sigmoid diverticulosis  and cholelithiasis     Outpatient Encounter Medications as of 09/02/2023  Medication Sig   bisoprolol  (ZEBETA ) 5 MG tablet TAKE 1 TABLET(5 MG) BY MOUTH DAILY   dicyclomine  (BENTYL ) 10 MG capsule Take 10 mg by mouth as  needed for spasms.   ENTRESTO  97-103 MG TAKE 1 TABLET BY MOUTH TWICE DAILY   FEROSUL 325 (65 Fe) MG tablet Take 325 mg by mouth daily.   furosemide  (LASIX ) 40 MG tablet Take 1 tablet (40 mg total) by mouth daily as needed (lower extremity swelling and weight gain).   Homeopathic Products (ZICAM ALLERGY RELIEF NA) Place 1 spray into the nose daily as needed (allergies).   HYDROcodone -acetaminophen  (NORCO) 10-325 MG tablet Take 1 tablet by mouth 5 (five) times daily.   Multiple Vitamin (MULTIVITAMIN WITH MINERALS) TABS tablet Take 1 tablet by mouth in the morning.   nicotine  polacrilex (NICORETTE ) 4 MG gum Take 4 mg by mouth as needed for smoking cessation.   omega-3 acid ethyl esters (LOVAZA ) 1 g capsule Take 1 g by mouth 2 (two) times daily.   pantoprazole  (PROTONIX ) 40 MG tablet TAKE 1 TABLET(40 MG) BY MOUTH DAILY BEFORE BREAKFAST   potassium chloride  SA (KLOR-CON  M) 20 MEQ tablet Take 1 tablet (20 mEq total) by mouth daily as needed (take with as needed lasix .).   rosuvastatin  (CRESTOR ) 40 MG tablet TAKE 1 TABLET(40 MG) BY MOUTH DAILY   spironolactone  (ALDACTONE ) 25 MG tablet TAKE 1 TABLET(25 MG) BY MOUTH DAILY   Upadacitinib  ER (RINVOQ ) 30 MG TB24 Take 1 tablet (30 mg total) by mouth daily.   [DISCONTINUED] AMBULATORY NON FORMULARY MEDICATION Medication Name: Nitroglycerin  0.125% use a pea sized amount per rectum 3-4 times a day for 8 weeks   No facility-administered encounter medications on file as of 09/02/2023.    Allergies as of 09/02/2023   (No Known Allergies)    Past Medical History:  Diagnosis Date   Bursitis of right shoulder    CHF (congestive heart failure) (HCC)    thelbert 10/24/2016   Chronic combined systolic and diastolic heart failure (HCC) 03/16/2017   Coronary artery disease    Dilated cardiomyopathy (HCC)    thelbert 10/24/2016   Dysrhythmia 1995   VT per patient- states had neg stress test and no problems since   Encounter for adjustment or management of  automatic implantable cardioverter-defibrillator 06/17/2018   Encounter for assessment of automatic implantable cardioverter-defibrillator (AICD) 06/17/2018   Heart murmur    when I was young   History of kidney stones    Hypercholesterolemia    ICD: Medtronic MRI Quad CRTD (Bi-V ICD) implantation 03/16/2017 [facility] 03/16/2017   Scheduled Remote ICD check   8.7.20: No VHR episodes. No mode switches. Normal health trends. Trans-thoracic impedance trends and the OptiVol Fluid Index do no present significant abnormalities. Battery longevity is 6.5-8.3 years. RA pacing is 8.3 %, RV pacing is 99.9 %, and LV pacing is 99.9 %.  Clinic: 05/18/17   NICM (nonischemic cardiomyopathy) (HCC) 03/16/2017    Past Surgical History:  Procedure Laterality Date   BIOPSY  12/06/2018   Procedure: BIOPSY;  Surgeon: Shila Gustav GAILS, MD;  Location: [facility];  Service: Endoscopy;;  Egd and Colon   BIV ICD INSERTION CRT-D N/A 03/16/2017   Procedure: BIV ICD INSERTION CRT-D;  Surgeon: Sage Elspeth BROCKS, MD;  Location: [facility] INVASIVE CV LAB;  Service: Cardiovascular;  Laterality: N/A;   BUBBLE STUDY  11/30/2019   Procedure: BUBBLE STUDY;  Surgeon: Michele Richardson, DO;  Location: [facility] ENDOSCOPY;  Service: Cardiovascular;;   COLONOSCOPY WITH PROPOFOL  N/A 03/10/2017   Procedure: COLONOSCOPY WITH PROPOFOL ;  Surgeon: Shila Gustav GAILS, MD;  Location: [facility];  Service: Endoscopy;  Laterality: N/A;   COLONOSCOPY WITH PROPOFOL  N/A 12/06/2018   Procedure: COLONOSCOPY WITH PROPOFOL ;  Surgeon: Shila Gustav GAILS, MD;  Location: [facility];  Service: Endoscopy;  Laterality: N/A;   CORONARY ANGIOPLASTY WITH STENT PLACEMENT  10/28/2016   CORONARY PRESSURE/FFR STUDY N/A 10/28/2016   Procedure: INTRAVASCULAR PRESSURE WIRE/FFR STUDY;  Surgeon: Elmira Newman PARAS, MD;  Location: [facility] INVASIVE CV LAB;  Service: Cardiovascular;  Laterality: N/A;   CORONARY STENT INTERVENTION N/A 10/28/2016   Procedure: CORONARY STENT INTERVENTION;   Surgeon: Elmira Newman PARAS, MD;  Location: [facility] INVASIVE CV LAB;  Service: Cardiovascular;  Laterality: N/A;   ESOPHAGOGASTRODUODENOSCOPY (EGD) WITH PROPOFOL  N/A 03/10/2017   Procedure: ESOPHAGOGASTRODUODENOSCOPY (EGD) WITH PROPOFOL ;  Surgeon: Shila Gustav GAILS, MD;  Location: [facility];  Service: Endoscopy;  Laterality: N/A;   ESOPHAGOGASTRODUODENOSCOPY (EGD) WITH PROPOFOL  N/A 12/06/2018   Procedure: ESOPHAGOGASTRODUODENOSCOPY (EGD) WITH PROPOFOL ;  Surgeon: Shila Gustav GAILS, MD;  Location: [facility];  Service: Endoscopy;  Laterality: N/A;   IMPLANTABLE CARDIOVERTER DEFIBRILLATOR (ICD) GENERATOR CHANGE N/A 03/28/2021   Procedure: ICD GENERATOR CHANGE LV LEAD EXTRACTION AND LV LEAD REPLACEMENT;  Surgeon: Waddell Danelle ORN, MD;  Location: [facility] OR;  Service: Cardiovascular;  Laterality: N/A;   INGUINAL HERNIA REPAIR Right 02/16/2013   Procedure: RIGHT INGUINAL HERNIA REPAIR ;  Surgeon: Donnice KATHEE Lunger, MD;  Location: [facility] ORS;  Service: General;  Laterality: Right;  With MESH   INGUINAL HERNIA REPAIR Right 02/16/2013   RIGHT/LEFT HEART CATH AND CORONARY ANGIOGRAPHY N/A 10/28/2016   Procedure: RIGHT/LEFT HEART CATH AND CORONARY ANGIOGRAPHY;  Surgeon: Elmira Newman PARAS, MD;  Location: [facility] INVASIVE CV LAB;  Service: Cardiovascular;  Laterality: N/A;   TEE WITHOUT CARDIOVERSION N/A 11/30/2019   Procedure: TRANSESOPHAGEAL ECHOCARDIOGRAM (TEE);  Surgeon: Michele Richardson, DO;  Location: [facility];  Service: Cardiovascular;  Laterality: N/A;   ULTRASOUND GUIDANCE FOR VASCULAR ACCESS  10/28/2016   Procedure: Ultrasound Guidance For Vascular Access;  Surgeon: Elmira Newman PARAS, MD;  Location: [facility] INVASIVE CV LAB;  Service: Cardiovascular;;    Family History  Problem Relation Age of Onset   Heart attack Father    Colon polyps Father    Colon polyps Brother    Colon cancer Neg Hx    Stomach cancer Neg Hx    Pancreatic cancer Neg Hx    Esophageal cancer Neg Hx    Liver disease Neg Hx    Rectal  cancer Neg Hx     Social History   Socioeconomic History   Marital status: Married    Spouse name: Not on file   Number of children: 2   Years of education: Not on file   Highest education level: Not on file  Occupational History   Occupation: Transport planner    Comment: [employer]  Tobacco Use   Smoking status: Former    Current packs/day: 0.00    Average packs/day: 1 pack/day for 12.0 years (12.0 ttl pk-yrs)    Types: Cigarettes, Cigars    Start date: 02/16/1999    Quit date: 02/16/2011    Years since quitting: 12.5   Smokeless tobacco: Former    Types: Snuff   Tobacco comments:    10/28/2016 'chewed for a couple years after I quit smoking; 5-6 x/day as of 05/18/23  Vaping Use   Vaping status: Never Used  Substance and Sexual Activity   Alcohol use: No   Drug use: No   Sexual activity: Not Currently  Other Topics Concern   Not on file  Social History Narrative   Not on file   Social Drivers of Health   Financial Resource Strain: Not on file  Food Insecurity: Not on file  Transportation Needs: Not on file  Physical Activity: Not on file  Stress: Not on file  Social Connections: Not on file  Intimate Partner Violence: Not on file      Review of systems: All other review of systems negative except as mentioned in the HPI.   Physical Exam: Vitals:   09/02/23 1400  BP: 124/66  Pulse: 75   Body mass index is 26.11 kg/m. Gen:      No acute distress HEENT:  sclera anicteric CV: s1s2 rrr, no murmur Lungs: B/l clear. Abd:      soft, non-tender; no palpable masses, no distension Ext:    No edema Neuro: alert and oriented x 3 Psych: normal mood and affect  Data Reviewed:  Reviewed labs, radiology imaging, old records and pertinent past GI work up     Assessment and Plan Assessment & Plan  Crohn's disease Crohn's disease is well-controlled with no symptoms of rectal bleeding or pain. Previous anal fissure has resolved. Bowel movements are  regular without constipation or blood. Weight is stable and energy levels are improving. - Continue current medication regimen with Rinvoq  - Recheck fecal calprotectin in six months for surveillance and disease activity monitoring.  Iron deficiency anemia Iron deficiency anemia secondary to rectal bleeding is improving with current treatment. - Order CBC to assess current blood counts. - Continue iron supplementation until iron stores are adequate.  Vitamin D  deficiency Vitamin D  deficiency is being managed with supplementation. - Continue vitamin D  supplementation. - Recheck vitamin levels in six months.   Return in 6 months or sooner if needed    This visit required >30 minutes of patient care (this includes precharting, chart review, review of results, face-to-face time used for counseling as well as treatment plan and follow-up. The patient was provided an opportunity to ask questions and all were answered. The patient agreed with the plan and demonstrated an understanding of the instructions.  LOIS Wilkie Mcgee , MD    CC: Loring Tanda Mae, *

## 2023-09-02 NOTE — Patient Instructions (Addendum)
 VISIT SUMMARY:  You had a follow-up visit to check on your Crohn's disease, iron deficiency anemia, and vitamin D  deficiency. Your Crohn's disease is well-controlled, and your anemia and vitamin D  levels are improving with current treatments.  YOUR PLAN:  CROHN'S DISEASE: Your Crohn's disease is well-controlled with no symptoms of rectal bleeding or pain. Your bowel movements are regular, and your weight is stable. -Continue your current medication regimen. -We will recheck your fecal calprotectin levels in six months.  IRON DEFICIENCY ANEMIA: Your iron deficiency anemia, which was caused by rectal bleeding, is improving with your current treatment. -We will order a CBC to assess your current blood counts. -Continue taking your iron supplements until your iron stores are adequate.  VITAMIN D  DEFICIENCY: Your vitamin D  deficiency is being managed with supplementation. -Continue taking your vitamin D  supplements. -We will recheck your vitamin D  levels in six months. -Follow up in 6 months   Due to recent changes in healthcare laws, you may see the results of your imaging and laboratory studies on MyChart before your provider has had a chance to review them.  We understand that in some cases there may be results that are confusing or concerning to you. Not all laboratory results come back in the same time frame and the provider may be waiting for multiple results in order to interpret others.  Please give us  48 hours in order for your provider to thoroughly review all the results before contacting the office for clarification of your results.    _______________________________________________________  If your blood pressure at your visit was 140/90 or greater, please contact your primary care physician to follow up on this.  _______________________________________________________  If you are age 53 or older, your body mass index should be between 23-30. Your Body mass index is 26.11 kg/m.  If this is out of the aforementioned range listed, please consider follow up with your Primary Care Provider.  If you are age 21 or younger, your body mass index should be between 19-25. Your Body mass index is 26.11 kg/m. If this is out of the aformentioned range listed, please consider follow up with your Primary Care Provider.   ________________________________________________________  The [facility] providers would like to encourage you to use MYCHART to communicate with providers for non-urgent requests or questions.  Due to long hold times on the telephone, sending your provider a message by [facility] may be a faster and more efficient way to get a response.  Please allow 48 business hours for a response.  Please remember that this is for non-urgent requests.  _______________________________________________________  [facility] is using a team-based approach to care.  Your team is made up of your doctor and two to three APPS. Our APPS (Nurse Practitioners and Physician Assistants) work with your physician to ensure care continuity for you. They are fully qualified to address your health concerns and develop a treatment plan. They communicate directly with your gastroenterologist to care for you. Seeing the Advanced Practice Practitioners on your physician's team can help you by facilitating care more promptly, often allowing for earlier appointments, access to diagnostic testing, procedures, and other specialty referrals.    I appreciate the  opportunity to care for you  Thank You   Kavitha Nandigam , MD

## 2023-09-04 NOTE — Progress Notes (Signed)
 Remote ICD transmission.

## 2023-09-04 NOTE — Addendum Note (Signed)
 Addended by: VICCI SELLER A on: 09/04/2023 02:21 PM   Modules accepted: Orders

## 2023-09-22 ENCOUNTER — Ambulatory Visit (INDEPENDENT_AMBULATORY_CARE_PROVIDER_SITE_OTHER): Payer: 59

## 2023-09-22 DIAGNOSIS — I447 Left bundle-branch block, unspecified: Secondary | ICD-10-CM

## 2023-09-24 ENCOUNTER — Ambulatory Visit: Admitting: Gastroenterology

## 2023-09-25 LAB — CUP PACEART REMOTE DEVICE CHECK
Battery Remaining Longevity: 95 mo
Battery Voltage: 3 V
Brady Statistic AP VP Percent: 8.12 %
Brady Statistic AP VS Percent: 0.42 %
Brady Statistic AS VP Percent: 89.89 %
Brady Statistic AS VS Percent: 1.57 %
Brady Statistic RA Percent Paced: 8.53 %
Brady Statistic RV Percent Paced: 0.49 %
Date Time Interrogation Session: 20250917202356
HighPow Impedance: 51 Ohm
Implantable Lead Connection Status: 753985
Implantable Lead Connection Status: 753985
Implantable Lead Connection Status: 753985
Implantable Lead Implant Date: 20190311
Implantable Lead Implant Date: 20190311
Implantable Lead Implant Date: 20230323
Implantable Lead Location: 753858
Implantable Lead Location: 753859
Implantable Lead Location: 753860
Implantable Lead Model: 4398
Implantable Lead Model: 5076
Implantable Pulse Generator Implant Date: 20230323
Lead Channel Impedance Value: 174.595
Lead Channel Impedance Value: 178.5 Ohm
Lead Channel Impedance Value: 185.725
Lead Channel Impedance Value: 194.634
Lead Channel Impedance Value: 203.256
Lead Channel Impedance Value: 266 Ohm
Lead Channel Impedance Value: 323 Ohm
Lead Channel Impedance Value: 323 Ohm
Lead Channel Impedance Value: 380 Ohm
Lead Channel Impedance Value: 380 Ohm
Lead Channel Impedance Value: 380 Ohm
Lead Channel Impedance Value: 399 Ohm
Lead Channel Impedance Value: 437 Ohm
Lead Channel Impedance Value: 608 Ohm
Lead Channel Impedance Value: 646 Ohm
Lead Channel Impedance Value: 665 Ohm
Lead Channel Impedance Value: 722 Ohm
Lead Channel Impedance Value: 722 Ohm
Lead Channel Pacing Threshold Amplitude: 0.375 V
Lead Channel Pacing Threshold Amplitude: 0.5 V
Lead Channel Pacing Threshold Amplitude: 0.5 V
Lead Channel Pacing Threshold Pulse Width: 0.4 ms
Lead Channel Pacing Threshold Pulse Width: 0.4 ms
Lead Channel Pacing Threshold Pulse Width: 0.4 ms
Lead Channel Sensing Intrinsic Amplitude: 0.75 mV
Lead Channel Sensing Intrinsic Amplitude: 0.75 mV
Lead Channel Sensing Intrinsic Amplitude: 2.75 mV
Lead Channel Sensing Intrinsic Amplitude: 2.75 mV
Lead Channel Setting Pacing Amplitude: 1 V
Lead Channel Setting Pacing Amplitude: 1.5 V
Lead Channel Setting Pacing Amplitude: 2 V
Lead Channel Setting Pacing Pulse Width: 0.4 ms
Lead Channel Setting Pacing Pulse Width: 0.4 ms
Lead Channel Setting Sensing Sensitivity: 0.3 mV
Zone Setting Status: 755011
Zone Setting Status: 755011

## 2023-09-28 NOTE — Progress Notes (Signed)
Remote ICD Transmission.

## 2023-09-30 ENCOUNTER — Ambulatory Visit: Payer: Self-pay | Admitting: Cardiology

## 2023-10-26 ENCOUNTER — Telehealth: Payer: Self-pay | Admitting: Oncology

## 2023-10-26 NOTE — Telephone Encounter (Signed)
 Left detailed message on PT phone with appt details.

## 2023-10-27 ENCOUNTER — Telehealth: Payer: Self-pay | Admitting: Oncology

## 2023-10-27 NOTE — Telephone Encounter (Signed)
 PT stated that he is feeling overwhelmed with all his appts right now, and would like to cancel and will reach back out when he is ready to schedule.

## 2023-10-29 ENCOUNTER — Other Ambulatory Visit: Payer: Self-pay | Admitting: Gastroenterology

## 2023-11-02 ENCOUNTER — Inpatient Hospital Stay: Admitting: Oncology

## 2023-11-03 ENCOUNTER — Other Ambulatory Visit: Payer: Self-pay | Admitting: Cardiology

## 2023-11-03 DIAGNOSIS — I5020 Unspecified systolic (congestive) heart failure: Secondary | ICD-10-CM

## 2023-11-16 ENCOUNTER — Ambulatory Visit: Attending: Internal Medicine | Admitting: Internal Medicine

## 2023-11-16 ENCOUNTER — Telehealth: Payer: Self-pay | Admitting: Gastroenterology

## 2023-11-16 VITALS — BP 120/64 | HR 63 | Ht 70.0 in | Wt 173.0 lb

## 2023-11-16 DIAGNOSIS — I5020 Unspecified systolic (congestive) heart failure: Secondary | ICD-10-CM | POA: Diagnosis not present

## 2023-11-16 LAB — CUP PACEART INCLINIC DEVICE CHECK
Date Time Interrogation Session: 20251110100634
Implantable Lead Connection Status: 753985
Implantable Lead Connection Status: 753985
Implantable Lead Connection Status: 753985
Implantable Lead Implant Date: 20190311
Implantable Lead Implant Date: 20190311
Implantable Lead Implant Date: 20230323
Implantable Lead Location: 753858
Implantable Lead Location: 753859
Implantable Lead Location: 753860
Implantable Lead Model: 4398
Implantable Lead Model: 5076
Implantable Pulse Generator Implant Date: 20230323

## 2023-11-16 NOTE — Patient Instructions (Signed)

## 2023-11-16 NOTE — Telephone Encounter (Signed)
 Can we bring him for urgent office visit with any app this week ? He will need labs and also may need EGD to exclude gastro duodenal ulcer

## 2023-11-16 NOTE — Telephone Encounter (Signed)
 No APP openings. Scheduled with provider.

## 2023-11-16 NOTE — Telephone Encounter (Signed)
 Miguel Pena tells me he has been having stomach pain for at least 3 weeks now. He thought it was related to the oral iron. However after being off of that for 2 weeks, he has not seen improvement. Stools are normal brown. Admits he has seen blood. He takes a daily stool softener. Reports soft daily stools. He took Rinvoq  45 mg for a week to see if that would help his symptoms, but it did not. He resumed the prescribed dosage of 30 mg. He also is experiencing bloating which is worse after eating. Patient is concerned he is having a flare.

## 2023-11-16 NOTE — Telephone Encounter (Signed)
 Inbound call from patient requesting to speak to [facility]. Patient stated that his stomach is acting up and needs medication call in to his pharmacy. Patient is requesting a call back.Please advise.

## 2023-11-16 NOTE — Progress Notes (Signed)
 HPI [name] returns for followup. He is a pleasant middle aged man with a non-ischemic CM, s/p Biv ICD insertion who developed an elevated pacing threshold and a CXR demonstrated dislodgement of his LV lead into the RA. He has a h/o normalization of his LV function after biv ICD insertion. He underwent LV lead extraction and insertion of a new LV lead and is doing well. He denies soreness of his incision. He has been bothered by Crohn's symptoms.   No Known Allergies   Current Outpatient Medications  Medication Sig Dispense Refill   bisoprolol  (ZEBETA ) 5 MG tablet TAKE 1 TABLET(5 MG) BY MOUTH DAILY 90 tablet 2   dicyclomine  (BENTYL ) 10 MG capsule Take 10 mg by mouth as needed for spasms.     ENTRESTO  97-103 MG TAKE 1 TABLET BY MOUTH TWICE DAILY 180 tablet 3   FEROSUL 325 (65 Fe) MG tablet Take 325 mg by mouth daily.     furosemide  (LASIX ) 40 MG tablet Take 1 tablet (40 mg total) by mouth daily as needed (lower extremity swelling and weight gain). 90 tablet 1   Homeopathic Products (ZICAM ALLERGY RELIEF NA) Place 1 spray into the nose daily as needed (allergies).     HYDROcodone -acetaminophen  (NORCO) 10-325 MG tablet Take 1 tablet by mouth 5 (five) times daily.     Multiple Vitamin (MULTIVITAMIN WITH MINERALS) TABS tablet Take 1 tablet by mouth in the morning.     nicotine  polacrilex (NICORETTE ) 4 MG gum Take 4 mg by mouth as needed for smoking cessation.     omega-3 acid ethyl esters (LOVAZA ) 1 g capsule Take 1 g by mouth 2 (two) times daily.     pantoprazole  (PROTONIX ) 40 MG tablet TAKE 1 TABLET(40 MG) BY MOUTH DAILY BEFORE BREAKFAST 90 tablet 3   potassium chloride  SA (KLOR-CON  M) 20 MEQ tablet Take 1 tablet (20 mEq total) by mouth daily as needed (take with as needed lasix .). 90 tablet 1   rosuvastatin  (CRESTOR ) 40 MG tablet TAKE 1 TABLET(40 MG) BY MOUTH DAILY 90 tablet 2   spironolactone  (ALDACTONE ) 25 MG tablet TAKE 1 TABLET(25 MG) BY MOUTH DAILY 90 tablet 3   Upadacitinib  ER  (RINVOQ ) 30 MG TB24 Take 1 tablet (30 mg total) by mouth daily. 30 tablet 6   No current facility-administered medications for this visit.     Past Medical History:  Diagnosis Date   Bursitis of right shoulder    CHF (congestive heart failure) (HCC)    thelbert 10/24/2016   Chronic combined systolic and diastolic heart failure (HCC) 03/16/2017   Coronary artery disease    Dilated cardiomyopathy (HCC)    thelbert 10/24/2016   Dysrhythmia 1995   VT per patient- states had neg stress test and no problems since   Encounter for adjustment or management of automatic implantable cardioverter-defibrillator 06/17/2018   Encounter for assessment of automatic implantable cardioverter-defibrillator (AICD) 06/17/2018   Heart murmur    when I was young   History of kidney stones    Hypercholesterolemia    ICD: Medtronic MRI Quad CRTD (Bi-V ICD) implantation 03/16/2017 [facility] 03/16/2017   Scheduled Remote ICD check   8.7.20: No VHR episodes. No mode switches. Normal health trends. Trans-thoracic impedance trends and the OptiVol Fluid Index do no present significant abnormalities. Battery longevity is 6.5-8.3 years. RA pacing is 8.3 %, RV pacing is 99.9 %, and LV pacing is 99.9 %.  Clinic: 05/18/17   NICM (nonischemic cardiomyopathy) (HCC) 03/16/2017  ROS:   All systems reviewed and negative except as noted in the HPI.   Past Surgical History:  Procedure Laterality Date   BIOPSY  12/06/2018   Procedure: BIOPSY;  Surgeon: Shila Gustav GAILS, MD;  Location: [facility];  Service: Endoscopy;;  Egd and Colon   BIV ICD INSERTION CRT-D N/A 03/16/2017   Procedure: BIV ICD INSERTION CRT-D;  Surgeon: Fernande Elspeth BROCKS, MD;  Location: [facility] INVASIVE CV LAB;  Service: Cardiovascular;  Laterality: N/A;   BUBBLE STUDY  11/30/2019   Procedure: BUBBLE STUDY;  Surgeon: Michele Richardson, DO;  Location: [facility];  Service: Cardiovascular;;   COLONOSCOPY WITH PROPOFOL  N/A 03/10/2017   Procedure: COLONOSCOPY WITH  PROPOFOL ;  Surgeon: Shila Gustav GAILS, MD;  Location: [facility];  Service: Endoscopy;  Laterality: N/A;   COLONOSCOPY WITH PROPOFOL  N/A 12/06/2018   Procedure: COLONOSCOPY WITH PROPOFOL ;  Surgeon: Shila Gustav GAILS, MD;  Location: [facility];  Service: Endoscopy;  Laterality: N/A;   CORONARY ANGIOPLASTY WITH STENT PLACEMENT  10/28/2016   CORONARY PRESSURE/FFR STUDY N/A 10/28/2016   Procedure: INTRAVASCULAR PRESSURE WIRE/FFR STUDY;  Surgeon: Elmira Newman PARAS, MD;  Location: [facility];  Service: Cardiovascular;  Laterality: N/A;   CORONARY STENT INTERVENTION N/A 10/28/2016   Procedure: CORONARY STENT INTERVENTION;  Surgeon: Elmira Newman PARAS, MD;  Location: [facility];  Service: Cardiovascular;  Laterality: N/A;   ESOPHAGOGASTRODUODENOSCOPY (EGD) WITH PROPOFOL  N/A 03/10/2017   Procedure: ESOPHAGOGASTRODUODENOSCOPY (EGD) WITH PROPOFOL ;  Surgeon: Shila Gustav GAILS, MD;  Location: [facility];  Service: Endoscopy;  Laterality: N/A;   ESOPHAGOGASTRODUODENOSCOPY (EGD) WITH PROPOFOL  N/A 12/06/2018   Procedure: ESOPHAGOGASTRODUODENOSCOPY (EGD) WITH PROPOFOL ;  Surgeon: Shila Gustav GAILS, MD;  Location: [facility];  Service: Endoscopy;  Laterality: N/A;   IMPLANTABLE CARDIOVERTER DEFIBRILLATOR (ICD) GENERATOR CHANGE N/A 03/28/2021   Procedure: ICD GENERATOR CHANGE LV LEAD EXTRACTION AND LV LEAD REPLACEMENT;  Surgeon: Waddell Danelle ORN, MD;  Location[facility];  Service: Cardiovascular;  Laterality: N/A;   INGUINAL HERNIA REPAIR Right 02/16/2013   Procedure: RIGHT INGUINAL HERNIA REPAIR ;  Surgeon: Donnice KATHEE Lunger, MD;  Location: [facility];  Service: General;  Laterality: Right;  With MESH   INGUINAL HERNIA REPAIR Right 02/16/2013   RIGHT/LEFT HEART CATH AND CORONARY ANGIOGRAPHY N/A 10/28/2016   Procedure: RIGHT/LEFT HEART CATH AND CORONARY ANGIOGRAPHY;  Surgeon: Elmira Newman PARAS, MD;  [facility];  Service: Cardiovascular;  Laterality: N/A;   TEE WITHOUT  CARDIOVERSION N/A 11/30/2019   Procedure: TRANSESOPHAGEAL ECHOCARDIOGRAM (TEE);  Surgeon: Michele Richardson, DO;  [facility]: [facility];  Service: Cardiovascular;  Laterality: N/A;   ULTRASOUND GUIDANCE FOR VASCULAR ACCESS  10/28/2016   Procedure: Ultrasound Guidance For Vascular Access;  Surgeon: Elmira Newman PARAS, MD;  [facility]: [facility];  Service: Cardiovascular;;     Family History  Problem Relation Age of Onset   Heart attack Father    Colon polyps Father    Colon polyps Brother    Colon cancer Neg Hx    Stomach cancer Neg Hx    Pancreatic cancer Neg Hx    Esophageal cancer Neg Hx    Liver disease Neg Hx    Rectal cancer Neg Hx      Social History   Socioeconomic History   Marital status: Married    Spouse name: Not on file   Number of children: 2   Years of education: Not on file   Highest education level: Not on file  Occupational History   Occupation: restoration  contractor    Comment: [employer]  Tobacco Use   Smoking status: Former    Current packs/day: 0.00    Average packs/day: 1 pack/day for 12.0 years (12.0 ttl pk-yrs)    Types: Cigarettes, Cigars    Start date: 02/16/1999    Quit date: 02/16/2011    Years since quitting: 12.7   Smokeless tobacco: Former    Types: Snuff   Tobacco comments:    10/28/2016 'chewed for a couple years after I quit smoking; 5-6 x/day as of 05/18/23  Vaping Use   Vaping status: Never Used  Substance and Sexual Activity   Alcohol use: No   Drug use: No   Sexual activity: Not Currently  Other Topics Concern   Not on file  Social History Narrative   Not on file   Social Drivers of Health   Financial Resource Strain: Not on file  Food Insecurity: Not on file  Transportation Needs: Not on file  Physical Activity: Not on file  Stress: Not on file  Social Connections: Not on file  Intimate Partner Violence: Not on file     BP 120/64   Pulse 63   Ht 5' 10 (1.778 m)   Wt 173 lb (78.5 kg)   SpO2 97%   BMI  24.82 kg/m   Physical Exam:  Well appearing NAD HEENT: Unremarkable Neck:  No JVD, no thyromegally Lymphatics:  No adenopathy Back:  No CVA tenderness Lungs:  Clear with no wheezes HEART:  Regular rate rhythm, no murmurs, no rubs, no clicks Abd:  soft, positive bowel sounds, no organomegally, no rebound, no guarding Ext:  2 plus pulses, no edema, no cyanosis, no clubbing Skin:  No rashes no nodules Neuro:  CN II through XII intact, motor grossly intact  DEVICE  Normal device function.  See PaceArt for details.   Assess/Plan:  ICD lead dysfunction - he has undergone lead revision and his device including the LV lead are now working normally. Chronic systolic heart failure - his symptoms are class 2A. He will continue GDMT   Danelle Daven Pinckney,MD

## 2023-11-18 ENCOUNTER — Ambulatory Visit (INDEPENDENT_AMBULATORY_CARE_PROVIDER_SITE_OTHER): Admitting: Gastroenterology

## 2023-11-18 ENCOUNTER — Other Ambulatory Visit: Payer: Self-pay

## 2023-11-18 ENCOUNTER — Encounter: Payer: Self-pay | Admitting: Gastroenterology

## 2023-11-18 ENCOUNTER — Other Ambulatory Visit (INDEPENDENT_AMBULATORY_CARE_PROVIDER_SITE_OTHER)

## 2023-11-18 ENCOUNTER — Ambulatory Visit: Payer: Self-pay | Admitting: Gastroenterology

## 2023-11-18 VITALS — BP 110/60 | HR 61 | Ht 70.0 in | Wt 182.5 lb

## 2023-11-18 DIAGNOSIS — R1013 Epigastric pain: Secondary | ICD-10-CM | POA: Diagnosis not present

## 2023-11-18 DIAGNOSIS — K50813 Crohn's disease of both small and large intestine with fistula: Secondary | ICD-10-CM

## 2023-11-18 DIAGNOSIS — R109 Unspecified abdominal pain: Secondary | ICD-10-CM | POA: Diagnosis not present

## 2023-11-18 DIAGNOSIS — R140 Abdominal distension (gaseous): Secondary | ICD-10-CM

## 2023-11-18 DIAGNOSIS — D509 Iron deficiency anemia, unspecified: Secondary | ICD-10-CM | POA: Insufficient documentation

## 2023-11-18 LAB — CBC WITH DIFFERENTIAL/PLATELET
Basophils Absolute: 0 K/uL (ref 0.0–0.1)
Basophils Relative: 0.1 % (ref 0.0–3.0)
Eosinophils Absolute: 0.1 K/uL (ref 0.0–0.7)
Eosinophils Relative: 0.4 % (ref 0.0–5.0)
HCT: 28.6 % — ABNORMAL LOW (ref 39.0–52.0)
Hemoglobin: 9.6 g/dL — ABNORMAL LOW (ref 13.0–17.0)
Lymphocytes Relative: 9.6 % — ABNORMAL LOW (ref 12.0–46.0)
Lymphs Abs: 1.5 K/uL (ref 0.7–4.0)
MCHC: 33.7 g/dL (ref 30.0–36.0)
MCV: 95.2 fl (ref 78.0–100.0)
Monocytes Absolute: 0 K/uL — ABNORMAL LOW (ref 0.1–1.0)
Monocytes Relative: 0.3 % — ABNORMAL LOW (ref 3.0–12.0)
Neutro Abs: 13.8 K/uL — ABNORMAL HIGH (ref 1.4–7.7)
Neutrophils Relative %: 89.6 % — ABNORMAL HIGH (ref 43.0–77.0)
Platelets: 280 K/uL (ref 150.0–400.0)
RBC: 3 Mil/uL — ABNORMAL LOW (ref 4.22–5.81)
RDW: 15.4 % (ref 11.5–15.5)
WBC: 15.4 K/uL — ABNORMAL HIGH (ref 4.0–10.5)

## 2023-11-18 LAB — COMPREHENSIVE METABOLIC PANEL WITH GFR
ALT: 18 U/L (ref 0–53)
AST: 24 U/L (ref 0–37)
Albumin: 3.5 g/dL (ref 3.5–5.2)
Alkaline Phosphatase: 43 U/L (ref 39–117)
BUN: 10 mg/dL (ref 6–23)
CO2: 28 meq/L (ref 19–32)
Calcium: 9 mg/dL (ref 8.4–10.5)
Chloride: 103 meq/L (ref 96–112)
Creatinine, Ser: 0.89 mg/dL (ref 0.40–1.50)
GFR: 93.69 mL/min (ref >60.00)
Glucose, Bld: 99 mg/dL (ref 70–99)
Potassium: 3.8 meq/L (ref 3.5–5.1)
Sodium: 139 meq/L (ref 135–145)
Total Bilirubin: 0.7 mg/dL (ref 0.2–1.2)
Total Protein: 6.2 g/dL (ref 6.0–8.3)

## 2023-11-18 LAB — IBC + FERRITIN
Ferritin: 301.7 ng/mL (ref 22.0–322.0)
Iron: 29 ug/dL — ABNORMAL LOW (ref 42–165)
Saturation Ratios: 11.6 % — ABNORMAL LOW (ref 20.0–50.0)
TIBC: 250.6 ug/dL (ref 250.0–450.0)
Transferrin: 179 mg/dL — ABNORMAL LOW (ref 212.0–360.0)

## 2023-11-18 LAB — VITAMIN B12: Vitamin B-12: 645 pg/mL (ref 211–911)

## 2023-11-18 LAB — VITAMIN D 25 HYDROXY (VIT D DEFICIENCY, FRACTURES): VITD: 46.62 ng/mL (ref 30.00–100.00)

## 2023-11-18 LAB — FOLATE: Folate: 21.7 ng/mL (ref >5.9)

## 2023-11-18 LAB — C-REACTIVE PROTEIN: CRP: 1.8 mg/dL (ref 0.5–20.0)

## 2023-11-18 MED ORDER — PANTOPRAZOLE SODIUM 40 MG PO TBEC: 40.0000 mg | DELAYED_RELEASE_TABLET | Freq: Two times a day (BID) | ORAL | 2 refills | Status: AC

## 2023-11-18 MED ORDER — HYDROCORTISONE ACETATE 25 MG RE SUPP: 25.0000 mg | Freq: Two times a day (BID) | RECTAL | 1 refills | Status: AC

## 2023-11-18 NOTE — Progress Notes (Signed)
 Miguel Pena    [id number]    Nov 04, 1965  Primary Care Physician:Elkins, Tanda Mae, MD  Referring Physician: Loring Tanda Mae, MD [address] [place],  [place] [address]   Chief complaint:  Epigastric pain, abdominal bloating, Crohn's disease  Discussed the use of AI scribe software for clinical note transcription with the patient, who gave verbal consent to proceed.  History of Present Illness Miguel Pena is a 58 year old male with Crohn's disease who presents with abdominal pain and bloating.  Abdominal pain and bloating - Worsening abdominal pain and bloating since early October - Pain is aching, located in the middle and upper abdomen, particularly after eating - Bloating occurs with meals; unable to consume large portions without discomfort - Burning sensation in abdomen after eating spaghetti, without radiation to chest - Eating less due to discomfort  Bowel habit changes - Irregular bowel habits since discontinuing iron supplements - Constipation more frequent - Bowel movements range from none to three or four times daily, with no regular pattern  Hemorrhoidal bleeding - Hemorrhoids bleed slightly when wiping, not into the toilet - Manual reduction of hemorrhoids after bowel movements  Medication and supplement use - Stable on Rinvoq  30 mg daily for Crohn's disease until about a month ago - Discontinued iron supplements due to concerns of iron overload - Currently taking pantoprazole  once daily - Uses stool softener and fiber supplements  Laboratory findings - Recent blood tests showed elevated white blood cell count - Low red blood cell count  Fecal calprotectin 500 June 2025    He is s/p CRT-D March 2019.   GI Hx:    CT abd & pelvis with contrast 05/20/23 1. Terminal ileal wall thickening and surrounding inflammatory changes, consistent with acute exacerbation of Crohn disease. 1 cm focal area of mural thickening with  central gas lucency in the inflamed terminal ileum may reflect a small focal intramural abscess or developing fistula. 2. Cholelithiasis without cholecystitis. 3. Distal colonic diverticulosis without evidence of acute diverticulitis. 4.  Aortic Atherosclerosis (ICD10-I70.0).  Colonoscopy 05/18/23  - Simple Endoscopic Score for Crohn' s Disease: 3, mucosal inflammatory changes secondary to Crohn' s disease, with ileitis and colitis. Biopsied. - Diverticulosis in the sigmoid colon. - Non- bleeding external and internal hemorrhoids.   EGD December 06, 2018: LA grade C esophagitis and gastritis.  H. pylori negative   Colonoscopy December 06, 2018: Terminal ileum ulceration with ileitis, biopsies consistent with IBD.  Diverticulosis and internal hemorrhoids otherwise normal exam.   Capsule endoscopy October 22, 2018: Multiple aphthous ulcers and inflammation throughout small bowel, predominantly in the distal small bowel consistent with Crohn's ileitis     03/10/2017 EGD : LA grade C reflux and candidiasis esophagitis, benign-appearing esophageal stenosis, gastritis with hemorrhage, duodenal erosions without bleeding otherwise normal.  Started on Nexium  40 twice daily.      03/10/2017  Colonoscopy : moderate diverticulosis in the sigmoid and descending colon, narrowing of the colon in association with diverticular opening, peridiverticular erythema, 1 5 mm polyp in the sigmoid colon and nonbleeding internal hemorrhoids.   Pathology showed chronic gastritis and reflux esophagitis negative for H. pylori or dysplasia.  Polyp removed was hyperplastic.  Recall colonoscopy recommended in 10 years.      CT abdomen pelvis with contrast 09/29/2018 with inflammatory changes and thickening of the distal and terminal ileum which may represent infectious enteritis or an inflammatory bowel disease.  No bowel obstruction,  normal appendix.  Sigmoid diverticulosis and cholelithiasis       Outpatient Encounter  Medications as of 11/18/2023  Medication Sig   bisoprolol  (ZEBETA ) 5 MG tablet TAKE 1 TABLET(5 MG) BY MOUTH DAILY   dicyclomine  (BENTYL ) 10 MG capsule Take 10 mg by mouth as needed for spasms.   ENTRESTO  97-103 MG TAKE 1 TABLET BY MOUTH TWICE DAILY   furosemide  (LASIX ) 40 MG tablet Take 1 tablet (40 mg total) by mouth daily as needed (lower extremity swelling and weight gain).   Homeopathic Products (ZICAM ALLERGY RELIEF NA) Place 1 spray into the nose daily as needed (allergies).   HYDROcodone -acetaminophen  (NORCO) 10-325 MG tablet Take 1 tablet by mouth 5 (five) times daily.   Multiple Vitamin (MULTIVITAMIN WITH MINERALS) TABS tablet Take 1 tablet by mouth in the morning.   nicotine  polacrilex (NICORETTE ) 4 MG gum Take 4 mg by mouth as needed for smoking cessation.   omega-3 acid ethyl esters (LOVAZA ) 1 g capsule Take 1 g by mouth 2 (two) times daily.   pantoprazole  (PROTONIX ) 40 MG tablet TAKE 1 TABLET(40 MG) BY MOUTH DAILY BEFORE BREAKFAST   potassium chloride  SA (KLOR-CON  M) 20 MEQ tablet Take 1 tablet (20 mEq total) by mouth daily as needed (take with as needed lasix .).   rosuvastatin  (CRESTOR ) 40 MG tablet TAKE 1 TABLET(40 MG) BY MOUTH DAILY   spironolactone  (ALDACTONE ) 25 MG tablet TAKE 1 TABLET(25 MG) BY MOUTH DAILY   Upadacitinib  ER (RINVOQ ) 30 MG TB24 Take 1 tablet (30 mg total) by mouth daily.   [DISCONTINUED] FEROSUL 325 (65 Fe) MG tablet Take 325 mg by mouth daily.   No facility-administered encounter medications on file as of 11/18/2023.    Allergies as of 11/18/2023   (No Known Allergies)    Past Medical History:  Diagnosis Date   Bursitis of right shoulder    CHF (congestive heart failure) (HCC)    thelbert 10/24/2016   Chronic combined systolic and diastolic heart failure (HCC) 03/16/2017   Coronary artery disease    Dilated cardiomyopathy (HCC)    thelbert 10/24/2016   Dysrhythmia 1995   VT per patient- states had neg stress test and no problems since   Encounter  for adjustment or management of automatic implantable cardioverter-defibrillator 06/17/2018   Encounter for assessment of automatic implantable cardioverter-defibrillator (AICD) 06/17/2018   Heart murmur    when I was young   History of kidney stones    Hypercholesterolemia    ICD: Medtronic MRI Quad CRTD (Bi-V ICD) implantation 03/16/2017 [facility]age 03/16/2017   Scheduled Remote ICD check   8.7.20: No VHR episodes. No mode switches. Normal health trends. Trans-thoracic impedance trends and the OptiVol Fluid Index do no present significant abnormalities. Battery longevity is 6.5-8.3 years. RA pacing is 8.3 %, RV pacing is 99.9 %, and LV pacing is 99.9 %.  Clinic: 05/18/17   NICM (nonischemic cardiomyopathy) (HCC) 03/16/2017    Past Surgical History:  Procedure Laterality Date   BIOPSY  12/06/2018   Procedure: BIOPSY;  Surgeon: Shila Gustav GAILS, MD;  Location: [facility];  Service: Endoscopy;;  Egd and Colon   BIV ICD INSERTION CRT-D N/A 03/16/2017   Procedure: BIV ICD INSERTION CRT-D;  Surgeon: Sage Elspeth BROCKS, MD;  Location: [facility] INVASIVE CV LAB;  Service: Cardiovascular;  Laterality: N/A;   BUBBLE STUDY  11/30/2019   Procedure: BUBBLE STUDY;  Surgeon: Michele Richardson, DO;  Location: [facility];  Service: Cardiovascular;;   COLONOSCOPY WITH PROPOFOL  N/A 03/10/2017   Procedure: COLONOSCOPY  WITH PROPOFOL ;  Surgeon: Shila Gustav GAILS, MD;  Location: [facility];  Service: Endoscopy;  Laterality: N/A;   COLONOSCOPY WITH PROPOFOL  N/A 12/06/2018   Procedure: COLONOSCOPY WITH PROPOFOL ;  Surgeon: Shila Gustav GAILS, MD;  Location: [facility];  Service: Endoscopy;  Laterality: N/A;   CORONARY ANGIOPLASTY WITH STENT PLACEMENT  10/28/2016   CORONARY PRESSURE/FFR STUDY N/A 10/28/2016   Procedure: INTRAVASCULAR PRESSURE WIRE/FFR STUDY;  Surgeon: Elmira Newman PARAS, MD;  Location: [facility];  Service: Cardiovascular;  Laterality: N/A;   CORONARY STENT INTERVENTION N/A 10/28/2016    Procedure: CORONARY STENT INTERVENTION;  Surgeon: Elmira Newman PARAS, MD;  Location: [facility];  Service: Cardiovascular;  Laterality: N/A;   ESOPHAGOGASTRODUODENOSCOPY (EGD) WITH PROPOFOL  N/A 03/10/2017   Procedure: ESOPHAGOGASTRODUODENOSCOPY (EGD) WITH PROPOFOL ;  Surgeon: Shila Gustav GAILS, MD;  Location: [facility];  Service: Endoscopy;  Laterality: N/A;   ESOPHAGOGASTRODUODENOSCOPY (EGD) WITH PROPOFOL  N/A 12/06/2018   Procedure: ESOPHAGOGASTRODUODENOSCOPY (EGD) WITH PROPOFOL ;  Surgeon: Shila Gustav GAILS, MD;  Location: [facility];  Service: Endoscopy;  Laterality: N/A;   IMPLANTABLE CARDIOVERTER DEFIBRILLATOR (ICD) GENERATOR CHANGE N/A 03/28/2021   Procedure: ICD GENERATOR CHANGE LV LEAD EXTRACTION AND LV LEAD REPLACEMENT;  Surgeon: Waddell Danelle ORN, MD;  Location[facility] OR;  Service: Cardiovascular;  Laterality: N/A;   INGUINAL HERNIA REPAIR Right 02/16/2013   Procedure: RIGHT INGUINAL HERNIA REPAIR ;  Surgeon: Donnice KATHEE Lunger, MD;  Location: [facility];  Service: General;  Laterality: Right;  With MESH   INGUINAL HERNIA REPAIR Right 02/16/2013   RIGHT/LEFT HEART CATH AND CORONARY ANGIOGRAPHY N/A 10/28/2016   Procedure: RIGHT/LEFT HEART CATH AND CORONARY ANGIOGRAPHY;  Surgeon: Elmira Newman PARAS, MD;  Location: [facility];  Service: Cardiovascular;  Laterality: N/A;   TEE WITHOUT CARDIOVERSION N/A 11/30/2019   Procedure: TRANSESOPHAGEAL ECHOCARDIOGRAM (TEE);  Surgeon: Michele Richardson, DO;  Location: [facility];  Service: Cardiovascular;  Laterality: N/A;   ULTRASOUND GUIDANCE FOR VASCULAR ACCESS  10/28/2016   Procedure: Ultrasound Guidance For Vascular Access;  Surgeon: Elmira Newman PARAS, MD;  Location: [facility];  Service: Cardiovascular;;    Family History  Problem Relation Age of Onset   Heart attack Father    Colon polyps Father    Colon polyps Brother    Colon cancer Neg Hx    Stomach cancer Neg Hx    Pancreatic cancer Neg Hx    Esophageal cancer Neg Hx     Liver disease Neg Hx    Rectal cancer Neg Hx     Social History   Socioeconomic History   Marital status: Married    Spouse name: Not on file   Number of children: 2   Years of education: Not on file   Highest education level: Not on file  Occupational History   Occupation: transport planner    Comment: [employer]  Tobacco Use   Smoking status: Former    Current packs/day: 0.00    Average packs/day: 1 pack/day for 12.0 years (12.0 ttl pk-yrs)    Types: Cigarettes, Cigars    Start date: 02/16/1999    Quit date: 02/16/2011    Years since quitting: 12.7   Smokeless tobacco: Former    Types: Snuff   Tobacco comments:    10/28/2016 'chewed for a couple years after I quit smoking; 5-6 x/day as of 05/18/23  Vaping Use   Vaping status: Never Used  Substance and Sexual Activity   Alcohol use: No   Drug use: No   Sexual  activity: Not Currently  Other Topics Concern   Not on file  Social History Narrative   Not on file   Social Drivers of Health   Financial Resource Strain: Not on file  Food Insecurity: Not on file  Transportation Needs: Not on file  Physical Activity: Not on file  Stress: Not on file  Social Connections: Not on file  Intimate Partner Violence: Not on file      Review of systems: All other review of systems negative except as mentioned in the HPI.   Physical Exam: Vitals:   11/18/23 0852  BP: 110/60  Pulse: 61   Body mass index is 26.19 kg/m. Gen:      No acute distress HEENT:  sclera anicteric CV: s1s2 rrr, no murmur Lungs: B/l clear. Abd:      soft, non-tender; no palpable masses, no distension Ext:    No edema Neuro: alert and oriented x 3 Psych: normal mood and affect  Data Reviewed:  Reviewed labs, radiology imaging, old records and pertinent past GI work up   Assessment and Plan Assessment & Plan Crohn's disease of small and large intestine, status post fistula, on Rinvoq , currently without clear evidence of flare No  clear evidence of Crohn's flare. Symptoms may be related to iron therapy rather than Crohn's disease. Rinvoq  30 mg daily is continued. - Continue Rinvoq  30 mg daily - Ordered fecal calprotectin to assess inflammation - Scheduled upper endoscopy to rule out ulcers  Epigastric pain and bloating, under evaluation for gastritis or ulcer Epigastric pain and bloating, possibly related to gastritis or gastric erosions from iron therapy. Pain is burning and located in the middle of the abdomen, not radiating to the chest. - Increased pantoprazole  to twice daily - Scheduled upper endoscopy to evaluate for ulcers  Constipation with irregular bowel habits, likely secondary to iron therapy, under management Constipation and irregular bowel habits likely secondary to iron therapy. Bowel movements are irregular, ranging from none to three or four times a day. - Continue stool softener twice daily - Add Benefiber or similar fiber supplement twice daily - Encouraged dietary modifications to include more fiber and less processed foods  Hemorrhoids with bleeding and prolapse, symptomatic, under conservative management Symptomatic hemorrhoids with bleeding and prolapse, likely exacerbated by irregular bowel habits. Conservative management is preferred until bowel habits improve. - Prescribed Anusol  suppository at bedtime as needed for hemorrhoids - Plan for hemorrhoid banding in a month if bowel habits improve  Iron deficiency anemia, under evaluation Iron deficiency anemia under evaluation. Previous iron therapy was discontinued due to gastrointestinal symptoms. - Ordered CBC, CMP, iron levels, B12, and folate levels  Elevated white blood cell count, under evaluation Elevated white blood cell count noted on previous tests. No symptoms of infection reported. Further evaluation needed to determine cause. - Ordered CBC and CRP - Will consider CT scan if white blood cell count remains elevated to exclude  abscess or fistula  Vitamin D  deficiency, under treatment Vitamin D  deficiency under treatment. He is taking supplements. - Ordered vitamin D  level     This visit required >40 minutes of patient care (this includes precharting, chart review, review of results, face-to-face time used for counseling as well as treatment plan and follow-up. The patient was provided an opportunity to ask questions and all were answered. The patient agreed with the plan and demonstrated an understanding of the instructions.  LOIS Wilkie Mcgee , MD    CC: Loring Tanda Mae, *

## 2023-11-18 NOTE — Patient Instructions (Addendum)
 VISIT SUMMARY:  During your visit, we discussed your worsening abdominal pain and bloating, irregular bowel habits, hemorrhoidal bleeding, and other health concerns. We have made adjustments to your medications and scheduled further tests to better understand and manage your symptoms.  YOUR PLAN:  CROHN'S DISEASE: You have Crohn's disease and are currently taking Rinvoq  30 mg daily. There is no clear evidence of a flare-up at this time. -Continue taking Rinvoq  30 mg daily. -We have ordered a fecal calprotectin test to assess inflammation. -An upper endoscopy has been scheduled to rule out ulcers.  EPIGASTRIC PAIN AND BLOATING: You are experiencing pain and bloating in the middle of your abdomen, possibly related to gastritis or gastric erosions from previous iron therapy. -Increase pantoprazole  to twice daily. -An upper endoscopy has been scheduled to evaluate for ulcers.  CONSTIPATION WITH IRREGULAR BOWEL HABITS: You have irregular bowel habits and constipation, likely due to previous iron therapy. -Continue taking a stool softener twice daily. -Add Benefiber or a similar fiber supplement twice daily. -Make dietary changes to include more fiber and less processed foods.  HEMORRHOIDS WITH BLEEDING AND PROLAPSE: You have symptomatic hemorrhoids with bleeding and prolapse, likely worsened by irregular bowel habits. -Use the prescribed suppository for hemorrhoids. -We will consider hemorrhoid banding in a month if your bowel habits improve.  IRON DEFICIENCY ANEMIA: You have iron deficiency anemia, and we are evaluating the cause. -We have ordered blood tests including CBC, CMP, iron levels, B12, and folate levels.  ELEVATED WHITE BLOOD CELL COUNT: Your recent blood tests showed an elevated white blood cell count, and we need to determine the cause. -We have ordered a CBC and CRP test. -We will consider a CT scan if your white blood cell count remains elevated.  VITAMIN D  DEFICIENCY: You  have a vitamin D  deficiency and are currently taking supplements. -We have ordered a vitamin D  level test.  Your provider has requested that you go to the basement level for lab work before leaving today. Press B on the elevator. The lab is located at the first door on the left as you exit the elevator.   We have sent the following medications to your pharmacy for you to pick up at your convenience: Pantoprazole , Anusol  Suppositories   Please purchase the following medications over the counter and take as  directed:  A high fiber diet with plenty of fluids (up to 8 glasses of water daily) is suggested to relieve these symptoms.  Benefiber 1 tablespoon twice daily can be used to keep bowels regular if needed.  Dulcolax ( stool softener) 1 capsule twice daily.    You have been scheduled for an endoscopy. Please follow written instructions given to you at your visit today.  If you use inhalers (even only as needed), please bring them with you on the day of your procedure.  If you take any of the following medications, they will need to be adjusted prior to your procedure:   DO NOT TAKE 7 DAYS PRIOR TO TEST- Trulicity (dulaglutide) Ozempic, Wegovy (semaglutide) Mounjaro, Zepbound (tirzepatide) Bydureon Bcise (exanatide extended release)  DO NOT TAKE 1 DAY PRIOR TO YOUR TEST Rybelsus (semaglutide) Adlyxin (lixisenatide) Victoza (liraglutide) Byetta (exanatide) ____________________________________________________________   _______________________________________________________  If your blood pressure at your visit was 140/90 or greater, please contact your primary care physician to follow up on this.  _______________________________________________________  If you are age 59 or older, your body mass index should be between 23-30. Your Body mass index is 26.19 kg/m. If this  is out of the aforementioned range listed, please consider follow up with your Primary Care  Provider.  If you are age 60 or younger, your body mass index should be between 19-25. Your Body mass index is 26.19 kg/m. If this is out of the aformentioned range listed, please consider follow up with your Primary Care Provider.   ________________________________________________________  The [facility] providers would like to encourage you to use MYCHART to communicate with providers for non-urgent requests or questions.  Due to long hold times on the telephone, sending your provider a message by [facility] may be a faster and more efficient way to get a response.  Please allow 48 business hours for a response.  Please remember that this is for non-urgent requests.  _______________________________________________________  [facility] is using a team-based approach to care.  Your team is made up of your doctor and two to three APPS. Our APPS (Nurse Practitioners and Physician Assistants) work with your physician to ensure care continuity for you. They are fully qualified to address your health concerns and develop a treatment plan. They communicate directly with your gastroenterologist to care for you. Seeing the Advanced Practice Practitioners on your physician's team can help you by facilitating care more promptly, often allowing for earlier appointments, access to diagnostic testing, procedures, and other specialty referrals.   Thank you for choosing me and [facility].  Dr. Kavitha Nandigam

## 2023-11-19 ENCOUNTER — Other Ambulatory Visit

## 2023-11-19 ENCOUNTER — Ambulatory Visit (HOSPITAL_COMMUNITY)
Admission: RE | Admit: 2023-11-19 | Discharge: 2023-11-19 | Disposition: A | Discharge status: Home / Self Care | Source: Ambulatory Visit | Attending: Gastroenterology | Admitting: Gastroenterology

## 2023-11-19 DIAGNOSIS — R109 Unspecified abdominal pain: Secondary | ICD-10-CM

## 2023-11-19 DIAGNOSIS — R1013 Epigastric pain: Secondary | ICD-10-CM | POA: Insufficient documentation

## 2023-11-19 DIAGNOSIS — R140 Abdominal distension (gaseous): Secondary | ICD-10-CM

## 2023-11-19 DIAGNOSIS — K50813 Crohn's disease of both small and large intestine with fistula: Secondary | ICD-10-CM

## 2023-11-19 MED ORDER — IOHEXOL 9 MG/ML PO SOLN
1000.0000 mL | ORAL | Status: AC
  Administered 2023-11-19: 1000 mL via ORAL

## 2023-11-19 MED ORDER — SODIUM CHLORIDE (PF) 0.9 % IJ SOLN
INTRAMUSCULAR | Status: AC
  Filled 2023-11-19: qty 50

## 2023-11-19 MED ORDER — IOHEXOL 300 MG/ML  SOLN
100.0000 mL | Freq: Once | INTRAMUSCULAR | Status: AC | PRN
  Administered 2023-11-19: 100 mL via INTRAVENOUS

## 2023-11-19 NOTE — Telephone Encounter (Signed)
 Spoke with the patient. He will have the CT tonight at 6 pm. He did not go to work today due to his pain. Pain is unchanged. Afebrile presently.

## 2023-11-19 NOTE — Telephone Encounter (Signed)
 PT returning call. Would like to further discuss symptoms he is having. Please advise.

## 2023-11-20 ENCOUNTER — Telehealth: Payer: Self-pay

## 2023-11-20 ENCOUNTER — Ambulatory Visit: Payer: Self-pay | Admitting: Gastroenterology

## 2023-11-20 LAB — QUANTIFERON-TB GOLD PLUS
Mitogen-NIL: 8.49 [IU]/mL
NIL: 0.03 [IU]/mL
QuantiFERON-TB Gold Plus: NEGATIVE
TB1-NIL: 0 [IU]/mL
TB2-NIL: 0 [IU]/mL

## 2023-11-20 MED ORDER — METRONIDAZOLE 500 MG PO TABS: 500.0000 mg | ORAL_TABLET | Freq: Three times a day (TID) | ORAL | 0 refills | Status: AC

## 2023-11-20 MED ORDER — CIPROFLOXACIN HCL 500 MG PO TABS: 500.0000 mg | ORAL_TABLET | Freq: Two times a day (BID) | ORAL | 0 refills | Status: AC

## 2023-11-20 MED ORDER — BUDESONIDE 3 MG PO CPEP
ORAL_CAPSULE | ORAL | 0 refills | Status: AC
Start: 2023-11-20 — End: ?

## 2023-11-20 NOTE — Telephone Encounter (Signed)
 Pharmacy Patient Advocate Encounter  Received notification from [employer] that Prior Authorization for Rinvoq  30MG  er tablets has been APPROVED from 11-20-2023 to 11-19-2024   PA #/Case ID/Reference #: [id number]

## 2023-11-20 NOTE — Telephone Encounter (Signed)
 Dr. Nandigam, patient will be scheduled as soon as possible.  Auth Submission: NO AUTH NEEDED Site of care: Site of care: [facility] Payer: [employer] commercial Medication & CPT/J Code(s) submitted: Venofer (Iron Sucrose) J1756 Diagnosis Code:  Route of submission (phone, fax, portal):  Phone # Fax # Auth type: Buy/Bill PB Units/visits requested: 200mg  x 5 doses Reference number:  Approval from: 11/20/23 to 01/06/24

## 2023-11-20 NOTE — Telephone Encounter (Signed)
 Pharmacy Patient Advocate Encounter   Received notification from Cover[employer]eds that prior authorization for Rinvoq  30MG  er tablets is required/requested.   Insurance verification completed.   The patient is insured through [facility].   Per test claim: PA required; PA submitted to above mentioned insurance via Latent Key/confirmation #/EOC [id number] Status is pending

## 2023-11-23 ENCOUNTER — Ambulatory Visit (AMBULATORY_SURGERY_CENTER): Admitting: Gastroenterology

## 2023-11-23 ENCOUNTER — Encounter: Payer: Self-pay | Admitting: Gastroenterology

## 2023-11-23 VITALS — BP 86/43 | HR 52 | Temp 98.0°F | Resp 17 | Ht 70.0 in | Wt 182.0 lb

## 2023-11-23 DIAGNOSIS — R109 Unspecified abdominal pain: Secondary | ICD-10-CM

## 2023-11-23 DIAGNOSIS — K2970 Gastritis, unspecified, without bleeding: Secondary | ICD-10-CM

## 2023-11-23 DIAGNOSIS — K319 Disease of stomach and duodenum, unspecified: Secondary | ICD-10-CM | POA: Diagnosis not present

## 2023-11-23 MED ORDER — SODIUM CHLORIDE 0.9 % IV SOLN: 500.0000 mL | INTRAVENOUS | Status: DC

## 2023-11-23 NOTE — Progress Notes (Unsigned)
 1204 Robinul 0.1 mg IV given due large amount of secretions upon assessment.  MD made aware, vss

## 2023-11-23 NOTE — Progress Notes (Unsigned)
 Please refer to office visit note 11/18/23. No additional changes in H&P Patient is appropriate for planned procedure(s) and anesthesia in an ambulatory setting  The patient was provided an opportunity to ask questions and all were answered. The patient agreed with the plan and demonstrated an understanding of the instructions.   LOIS Wilkie Mcgee , MD [phone]

## 2023-11-23 NOTE — Op Note (Addendum)
 [facility] Patient Name: Miguel Pena Procedure Date: 11/23/2023 11:58 AM MRN: [id number] Endoscopist: Gustav ALONSO Mcgee , MD, [id number] Age: 58 Referring MD:  Date of Birth: [birth year] Gender: Male Account #: [id number] Procedure:                Upper GI endoscopy Indications:              Epigastric abdominal pain, Dyspepsia Medicines:                Monitored Anesthesia Care Procedure:                Pre-Anesthesia Assessment:                           - Prior to the procedure, a History and Physical                            was performed, and patient medications and                            allergies were reviewed. The patient's tolerance of                            previous anesthesia was also reviewed. The risks                            and benefits of the procedure and the sedation                            options and risks were discussed with the patient.                            All questions were answered, and informed consent                            was obtained. Prior Anticoagulants: The patient has                            taken no anticoagulant or antiplatelet agents. ASA                            Grade Assessment: III - A patient with severe                            systemic disease. After reviewing the risks and                            benefits, the patient was deemed in satisfactory                            condition to undergo the procedure.                           After obtaining informed consent, the endoscope was  passed under direct vision. Throughout the                            procedure, the patient's blood pressure, pulse, and                            oxygen saturations were monitored continuously. The                            GIF [id number] #[id number] was introduced through the                            mouth, and advanced to the second part of duodenum.                            The upper  GI endoscopy was accomplished without                            difficulty. The patient tolerated the procedure                            well. Duodenal photograph was not coptured in error. Scope In: Scope Out: Findings:                 The Z-line was regular and was found 38 cm from the                            incisors.                           No gross lesions were noted in the entire esophagus.                           Patchy moderate inflammation characterized by                            congestion (edema), erythema, friability,                            granularity and nodularity was found in the gastric                            antrum and in the prepyloric region of the stomach.                            Biopsies were taken with a cold forceps for                            histology. Biopsies were taken with a cold forceps                            for Helicobacter pylori testing.  The cardia and gastric fundus were normal on                            retroflexion.                           The examined duodenum was normal. Complications:            No immediate complications. Estimated Blood Loss:     Estimated blood loss was minimal. Impression:               - Z-line regular, 38 cm from the incisors.                           - No gross lesions in the entire esophagus.                           - Gastritis. Biopsied.                           - Normal examined duodenum. Recommendation:           - Resume previous diet.                           - Continue present medications.                           - Await pathology results.                           - Return to GI office as previously scheduled. Kerrigan Gombos V. Christiaan Strebeck, MD 11/23/2023 12:25:38 PM This report has been signed electronically.

## 2023-11-23 NOTE — Patient Instructions (Signed)
-  Await pathology results -Handout on gastritis provided -Return to GI office as previously scheduled  YOU HAD AN ENDOSCOPIC PROCEDURE TODAY AT THE [facility]:   Refer to the procedure report that was given to you for any specific questions about what was found during the examination.  If the procedure report does not answer your questions, please call your gastroenterologist to clarify.  If you requested that your care partner not be given the details of your procedure findings, then the procedure report has been included in a sealed envelope for you to review at your convenience later.  YOU SHOULD EXPECT: Some feelings of bloating in the abdomen. Passage of more gas than usual.  Walking can help get rid of the air that was put into your GI tract during the procedure and reduce the bloating. If you had a lower endoscopy (such as a colonoscopy or flexible sigmoidoscopy) you may notice spotting of blood in your stool or on the toilet paper. If you underwent a bowel prep for your procedure, you may not have a normal bowel movement for a few days.  Please Note:  You might notice some irritation and congestion in your nose or some drainage.  This is from the oxygen used during your procedure.  There is no need for concern and it should clear up in a day or so.  SYMPTOMS TO REPORT IMMEDIATELY:  Following upper endoscopy (EGD)  Vomiting of blood or coffee ground material  New chest pain or pain under the shoulder blades  Painful or persistently difficult swallowing  New shortness of breath  Fever of 100F or higher  Black, tarry-looking stools  For urgent or emergent issues, a gastroenterologist can be reached at any hour by calling [phone]. Do not use MyChart messaging for urgent concerns.    DIET:  We do recommend a small meal at first, but then you may proceed to your regular diet.  Drink plenty of fluids but you should avoid alcoholic beverages for 24 hours.  ACTIVITY:   You should plan to take it easy for the rest of today and you should NOT DRIVE or use heavy machinery until tomorrow (because of the sedation medicines used during the test).    FOLLOW UP: Our staff will call the number listed on your records the next business day following your procedure.  We will call around 7:15- 8:00 am to check on you and address any questions or concerns that you may have regarding the information given to you following your procedure. If we do not reach you, we will leave a message.     If any biopsies were taken you will be contacted by phone or by letter within the next 1-3 weeks.  Please call us  at [phone] if you have not heard about the biopsies in 3 weeks.    SIGNATURES/CONFIDENTIALITY: You and/or your care partner have signed paperwork which will be entered into your electronic medical record.  These signatures attest to the fact that that the information above on your After Visit Summary has been reviewed and is understood.  Full responsibility of the confidentiality of this discharge information lies with you and/or your care-partner.

## 2023-11-23 NOTE — Progress Notes (Unsigned)
 Pt's states no medical or surgical changes since previsit or office visit.

## 2023-11-23 NOTE — Progress Notes (Unsigned)
 Report given to PACU, vss

## 2023-11-24 ENCOUNTER — Telehealth: Payer: Self-pay

## 2023-11-24 NOTE — Telephone Encounter (Signed)
  Follow up Call-     11/23/2023   11:22 AM 05/18/2023    9:30 AM  Call back number  Post procedure Call Back phone  # [phone]  Permission to leave phone message Yes Yes     Patient questions:  Do you have a fever, pain , or abdominal swelling? No. Pain Score  0 *  Have you tolerated food without any problems? Yes.    Have you been able to return to your normal activities? Yes.    Do you have any questions about your discharge instructions: Diet   No. Medications  No. Follow up visit  No.  Do you have questions or concerns about your Care? No.  Actions: * If pain score is 4 or above: No action needed, pain <4.

## 2023-11-25 ENCOUNTER — Other Ambulatory Visit: Payer: Self-pay | Admitting: Cardiology

## 2023-11-25 DIAGNOSIS — I5020 Unspecified systolic (congestive) heart failure: Secondary | ICD-10-CM

## 2023-11-25 LAB — SURGICAL PATHOLOGY

## 2023-11-25 LAB — CALPROTECTIN: Calprotectin: 2370 ug/g — ABNORMAL HIGH

## 2023-11-27 MED ORDER — SACUBITRIL-VALSARTAN 97-103 MG PO TABS: 1.0000 | ORAL_TABLET | Freq: Two times a day (BID) | ORAL | 0 refills | Status: AC

## 2023-12-01 ENCOUNTER — Ambulatory Visit

## 2023-12-01 VITALS — BP 121/72 | HR 53 | Temp 98.3°F | Resp 14 | Ht 70.0 in | Wt 177.0 lb

## 2023-12-01 DIAGNOSIS — D509 Iron deficiency anemia, unspecified: Secondary | ICD-10-CM

## 2023-12-01 MED ORDER — IRON SUCROSE 200 MG IVPB - SIMPLE MED
200.0000 mg | Freq: Once | Status: AC
  Administered 2023-12-01: 200 mg via INTRAVENOUS
  Filled 2023-12-01: qty 110

## 2023-12-01 NOTE — Progress Notes (Signed)
 Diagnosis: Iron  deficiency anemia  Provider:  Praveen Mannam MD  Procedure: IV Infusion  IV Type: Peripheral, IV Location: R Antecubital   Venofer  (Iron  Sucrose), Dose: 200 mg  Infusion Start Time: 0826  Infusion Stop Time: 0846  Post Infusion IV Care: Observation period completed and Peripheral IV Discontinued  Discharge: Condition: Good, Destination: Home . AVS Provided  Performed by:  Maximiano JONELLE Pouch, LPN

## 2023-12-07 ENCOUNTER — Ambulatory Visit

## 2023-12-08 ENCOUNTER — Ambulatory Visit (INDEPENDENT_AMBULATORY_CARE_PROVIDER_SITE_OTHER)

## 2023-12-08 VITALS — BP 113/66 | HR 50 | Temp 98.9°F | Resp 16 | Ht 70.0 in | Wt 187.6 lb

## 2023-12-08 DIAGNOSIS — D509 Iron deficiency anemia, unspecified: Secondary | ICD-10-CM

## 2023-12-08 MED ORDER — IRON SUCROSE 200 MG IVPB - SIMPLE MED
200.0000 mg | Freq: Once | Status: AC
  Administered 2023-12-08: 200 mg via INTRAVENOUS
  Filled 2023-12-08: qty 110

## 2023-12-08 NOTE — Progress Notes (Signed)
 Diagnosis: Iron  Deficiency Anemia  Provider:  Mannam, Praveen MD  Procedure: IV Infusion  IV Type: Peripheral, IV Location: R Antecubital  Venofer  (Iron  Sucrose), Dose: 200 mg  Infusion Start Time: 0840  Infusion Stop Time: 9141  Post Infusion IV Care: Observation period completed and Peripheral IV Discontinued  Discharge: Condition: Good, Destination: Home . AVS Declined  Performed by:  Rocky FORBES Sar, RN

## 2023-12-09 ENCOUNTER — Ambulatory Visit

## 2023-12-11 ENCOUNTER — Ambulatory Visit

## 2023-12-14 ENCOUNTER — Ambulatory Visit

## 2023-12-15 ENCOUNTER — Ambulatory Visit

## 2023-12-15 VITALS — BP 103/61 | HR 56 | Temp 99.0°F | Resp 14 | Ht 70.0 in | Wt 186.8 lb

## 2023-12-15 DIAGNOSIS — D509 Iron deficiency anemia, unspecified: Secondary | ICD-10-CM

## 2023-12-15 MED ORDER — SODIUM CHLORIDE 0.9 % IV SOLN
200.0000 mg | Freq: Once | INTRAVENOUS | Status: AC
  Administered 2023-12-15: 200 mg via INTRAVENOUS
  Filled 2023-12-15: qty 10

## 2023-12-15 NOTE — Progress Notes (Signed)
 Diagnosis: Iron  Deficiency Anemia  Provider:  Praveen Mannam MD  Procedure: IV Infusion  IV Type: Peripheral, IV Location: R Forearm  Venofer  (Iron  Sucrose), Dose: 200 mg  Infusion Start Time: 0826  Infusion Stop Time: 0842  Post Infusion IV Care: Observation period completed and Peripheral IV Discontinued  Discharge: Condition: Good, Destination: Home . AVS Declined  Performed by:  Burgandy Hackworth, RN

## 2023-12-17 ENCOUNTER — Ambulatory Visit (INDEPENDENT_AMBULATORY_CARE_PROVIDER_SITE_OTHER)

## 2023-12-17 VITALS — BP 109/69 | HR 59 | Temp 97.8°F | Resp 16 | Ht 70.0 in | Wt 187.0 lb

## 2023-12-17 DIAGNOSIS — D509 Iron deficiency anemia, unspecified: Secondary | ICD-10-CM | POA: Diagnosis not present

## 2023-12-17 MED ORDER — IRON SUCROSE 200 MG IVPB - SIMPLE MED
200.0000 mg | Freq: Once | Status: AC
  Administered 2023-12-17: 200 mg via INTRAVENOUS
  Filled 2023-12-17: qty 110

## 2023-12-17 NOTE — Progress Notes (Signed)
 Diagnosis:  Iron  Deficiency Anemia  Provider:  Praveen Mannam MD  Procedure: IV Infusion  IV Type: Peripheral, IV Location: R Antecubital   Venofer  (Iron  Sucrose), Dose: 200 mg  Infusion Start Time: 0821  Infusion Stop Time: 0837  Post Infusion IV Care: Observation period completed and Peripheral IV Discontinued  Discharge: Condition: Good, Destination: Home . AVS Declined  Performed by:  Maximiano JONELLE Pouch, LPN

## 2023-12-22 ENCOUNTER — Ambulatory Visit: Payer: 59

## 2023-12-22 ENCOUNTER — Ambulatory Visit

## 2023-12-22 VITALS — BP 124/77 | HR 55 | Temp 98.0°F | Resp 18 | Ht 70.0 in | Wt 187.6 lb

## 2023-12-22 DIAGNOSIS — D509 Iron deficiency anemia, unspecified: Secondary | ICD-10-CM

## 2023-12-22 DIAGNOSIS — I447 Left bundle-branch block, unspecified: Secondary | ICD-10-CM

## 2023-12-22 MED ORDER — IRON SUCROSE 200 MG IVPB - SIMPLE MED
200.0000 mg | Freq: Once | Status: AC
  Administered 2023-12-22: 08:00:00 200 mg via INTRAVENOUS
  Filled 2023-12-22: qty 110

## 2023-12-22 NOTE — Progress Notes (Signed)
 Diagnosis:  Iron  Deficiency Anemia  Provider:  Praveen Mannam MD  Procedure: IV Infusion  IV Type: Peripheral, IV Location: R Antecubital   Venofer  (Iron  Sucrose), Dose: 200 mg  Infusion Start Time: 0825  Infusion Stop Time: 0841  Post Infusion IV Care: Observation period completed and Peripheral IV Discontinued  Discharge: Condition: Good, Destination: Home . AVS Declined  Performed by:  Maximiano JONELLE Pouch, LPN

## 2023-12-23 ENCOUNTER — Other Ambulatory Visit: Payer: Self-pay | Admitting: Cardiology

## 2023-12-23 DIAGNOSIS — I5020 Unspecified systolic (congestive) heart failure: Secondary | ICD-10-CM

## 2023-12-25 ENCOUNTER — Other Ambulatory Visit: Payer: Self-pay

## 2023-12-25 DIAGNOSIS — K50813 Crohn's disease of both small and large intestine with fistula: Secondary | ICD-10-CM

## 2023-12-25 DIAGNOSIS — R195 Other fecal abnormalities: Secondary | ICD-10-CM

## 2023-12-25 DIAGNOSIS — R140 Abdominal distension (gaseous): Secondary | ICD-10-CM

## 2023-12-28 ENCOUNTER — Ambulatory Visit: Payer: Self-pay | Admitting: Gastroenterology

## 2023-12-28 LAB — CUP PACEART REMOTE DEVICE CHECK
Battery Remaining Longevity: 88 mo
Battery Voltage: 2.99 V
Brady Statistic AP VP Percent: 4.88 %
Brady Statistic AP VS Percent: 0.29 %
Brady Statistic AS VP Percent: 93.25 %
Brady Statistic AS VS Percent: 1.58 %
Brady Statistic RA Percent Paced: 5.16 %
Brady Statistic RV Percent Paced: 1.19 %
Date Time Interrogation Session: 20251219194150
HighPow Impedance: 49 Ohm
Implantable Lead Connection Status: 753985
Implantable Lead Connection Status: 753985
Implantable Lead Connection Status: 753985
Implantable Lead Implant Date: 20190311
Implantable Lead Implant Date: 20190311
Implantable Lead Implant Date: 20230323
Implantable Lead Location: 753858
Implantable Lead Location: 753859
Implantable Lead Location: 753860
Implantable Lead Model: 4398
Implantable Lead Model: 5076
Implantable Pulse Generator Implant Date: 20230323
Lead Channel Impedance Value: 161.5 Ohm
Lead Channel Impedance Value: 178.5 Ohm
Lead Channel Impedance Value: 178.5 Ohm
Lead Channel Impedance Value: 185.725
Lead Channel Impedance Value: 185.725
Lead Channel Impedance Value: 228 Ohm
Lead Channel Impedance Value: 285 Ohm
Lead Channel Impedance Value: 323 Ohm
Lead Channel Impedance Value: 323 Ohm
Lead Channel Impedance Value: 380 Ohm
Lead Channel Impedance Value: 380 Ohm
Lead Channel Impedance Value: 399 Ohm
Lead Channel Impedance Value: 437 Ohm
Lead Channel Impedance Value: 570 Ohm
Lead Channel Impedance Value: 646 Ohm
Lead Channel Impedance Value: 646 Ohm
Lead Channel Impedance Value: 646 Ohm
Lead Channel Impedance Value: 665 Ohm
Lead Channel Pacing Threshold Amplitude: 0.375 V
Lead Channel Pacing Threshold Amplitude: 0.625 V
Lead Channel Pacing Threshold Amplitude: 0.75 V
Lead Channel Pacing Threshold Pulse Width: 0.4 ms
Lead Channel Pacing Threshold Pulse Width: 0.4 ms
Lead Channel Pacing Threshold Pulse Width: 0.4 ms
Lead Channel Sensing Intrinsic Amplitude: 0.375 mV
Lead Channel Sensing Intrinsic Amplitude: 0.375 mV
Lead Channel Sensing Intrinsic Amplitude: 2.5 mV
Lead Channel Sensing Intrinsic Amplitude: 2.5 mV
Lead Channel Setting Pacing Amplitude: 1.25 V
Lead Channel Setting Pacing Amplitude: 1.5 V
Lead Channel Setting Pacing Amplitude: 2 V
Lead Channel Setting Pacing Pulse Width: 0.4 ms
Lead Channel Setting Pacing Pulse Width: 0.4 ms
Lead Channel Setting Sensing Sensitivity: 0.3 mV
Zone Setting Status: 755011
Zone Setting Status: 755011

## 2023-12-28 NOTE — Progress Notes (Signed)
 Remote ICD Transmission

## 2023-12-29 ENCOUNTER — Other Ambulatory Visit

## 2023-12-29 DIAGNOSIS — R140 Abdominal distension (gaseous): Secondary | ICD-10-CM

## 2023-12-29 DIAGNOSIS — R195 Other fecal abnormalities: Secondary | ICD-10-CM

## 2023-12-29 DIAGNOSIS — K50813 Crohn's disease of both small and large intestine with fistula: Secondary | ICD-10-CM

## 2024-01-01 ENCOUNTER — Other Ambulatory Visit: Payer: Self-pay | Admitting: Cardiology

## 2024-01-01 ENCOUNTER — Ambulatory Visit: Payer: Self-pay | Admitting: Student in an Organized Health Care Education/Training Program

## 2024-01-01 DIAGNOSIS — E781 Pure hyperglyceridemia: Secondary | ICD-10-CM

## 2024-01-01 DIAGNOSIS — I5020 Unspecified systolic (congestive) heart failure: Secondary | ICD-10-CM

## 2024-01-02 LAB — CALPROTECTIN, FECAL: Calprotectin, Fecal: 648 ug/g — ABNORMAL HIGH (ref 0–120)

## 2024-01-08 ENCOUNTER — Other Ambulatory Visit: Payer: Self-pay

## 2024-01-08 DIAGNOSIS — R1013 Epigastric pain: Secondary | ICD-10-CM

## 2024-01-08 DIAGNOSIS — K50813 Crohn's disease of both small and large intestine with fistula: Secondary | ICD-10-CM

## 2024-01-12 ENCOUNTER — Ambulatory Visit: Admitting: Gastroenterology

## 2024-01-13 ENCOUNTER — Ambulatory Visit: Payer: Self-pay | Admitting: Gastroenterology

## 2024-02-07 ENCOUNTER — Other Ambulatory Visit: Payer: Self-pay | Admitting: Gastroenterology

## 2024-03-10 ENCOUNTER — Ambulatory Visit: Admitting: Gastroenterology

## 2024-03-22 ENCOUNTER — Encounter

## 2024-06-21 ENCOUNTER — Encounter

## 2024-09-20 ENCOUNTER — Encounter

## 2024-12-20 ENCOUNTER — Encounter

## 2025-03-21 ENCOUNTER — Encounter
